# Patient Record
Sex: Female | Born: 1935 | Race: White | Hispanic: No | Smoking: Former smoker
Health system: Southern US, Community
[De-identification: ages and names within clinical notes are randomized; demographics above are authoritative.]

## PROBLEM LIST (undated history)

## (undated) DIAGNOSIS — I5032 Chronic diastolic (congestive) heart failure: Secondary | ICD-10-CM

## (undated) DIAGNOSIS — H3320 Serous retinal detachment, unspecified eye: Secondary | ICD-10-CM

## (undated) DIAGNOSIS — E039 Hypothyroidism, unspecified: Secondary | ICD-10-CM

## (undated) DIAGNOSIS — I10 Essential (primary) hypertension: Secondary | ICD-10-CM

## (undated) DIAGNOSIS — H3530 Unspecified macular degeneration: Secondary | ICD-10-CM

## (undated) DIAGNOSIS — E871 Hypo-osmolality and hyponatremia: Secondary | ICD-10-CM

## (undated) HISTORY — PX: RETINAL DETACHMENT SURGERY: SHX105

## (undated) HISTORY — DX: Hypothyroidism, unspecified: E03.9

## (undated) HISTORY — DX: Chronic diastolic (congestive) heart failure: I50.32

## (undated) HISTORY — PX: CHOLECYSTECTOMY: SHX55

## (undated) HISTORY — DX: Serous retinal detachment, unspecified eye: H33.20

## (undated) HISTORY — PX: CATARACT EXTRACTION: SUR2

## (undated) HISTORY — DX: Unspecified macular degeneration: H35.30

---

## 2008-11-07 ENCOUNTER — Ambulatory Visit: Payer: Self-pay | Admitting: Internal Medicine

## 2011-04-22 DIAGNOSIS — M5137 Other intervertebral disc degeneration, lumbosacral region: Secondary | ICD-10-CM | POA: Diagnosis not present

## 2011-04-22 DIAGNOSIS — M9981 Other biomechanical lesions of cervical region: Secondary | ICD-10-CM | POA: Diagnosis not present

## 2011-04-26 DIAGNOSIS — M5137 Other intervertebral disc degeneration, lumbosacral region: Secondary | ICD-10-CM | POA: Diagnosis not present

## 2011-04-26 DIAGNOSIS — M9981 Other biomechanical lesions of cervical region: Secondary | ICD-10-CM | POA: Diagnosis not present

## 2011-04-28 DIAGNOSIS — M9981 Other biomechanical lesions of cervical region: Secondary | ICD-10-CM | POA: Diagnosis not present

## 2011-04-28 DIAGNOSIS — M5137 Other intervertebral disc degeneration, lumbosacral region: Secondary | ICD-10-CM | POA: Diagnosis not present

## 2011-04-30 DIAGNOSIS — Z23 Encounter for immunization: Secondary | ICD-10-CM | POA: Diagnosis not present

## 2011-05-19 DIAGNOSIS — Z0000 Encounter for general adult medical examination without abnormal findings: Secondary | ICD-10-CM | POA: Diagnosis not present

## 2011-05-19 DIAGNOSIS — E039 Hypothyroidism, unspecified: Secondary | ICD-10-CM | POA: Diagnosis not present

## 2011-05-19 DIAGNOSIS — I10 Essential (primary) hypertension: Secondary | ICD-10-CM | POA: Diagnosis not present

## 2011-05-19 DIAGNOSIS — N951 Menopausal and female climacteric states: Secondary | ICD-10-CM | POA: Diagnosis not present

## 2011-06-21 DIAGNOSIS — Z1231 Encounter for screening mammogram for malignant neoplasm of breast: Secondary | ICD-10-CM | POA: Diagnosis not present

## 2011-06-21 DIAGNOSIS — Z13820 Encounter for screening for osteoporosis: Secondary | ICD-10-CM | POA: Diagnosis not present

## 2011-08-14 DIAGNOSIS — M109 Gout, unspecified: Secondary | ICD-10-CM | POA: Diagnosis not present

## 2011-08-19 ENCOUNTER — Emergency Department (HOSPITAL_COMMUNITY): Payer: Medicare Other

## 2011-08-19 ENCOUNTER — Observation Stay (HOSPITAL_COMMUNITY)
Admission: EM | Admit: 2011-08-19 | Discharge: 2011-08-20 | Disposition: A | Discharge status: Home / Self Care | Payer: Medicare Other | Attending: Emergency Medicine | Admitting: Emergency Medicine

## 2011-08-19 ENCOUNTER — Encounter (HOSPITAL_COMMUNITY): Payer: Self-pay | Admitting: *Deleted

## 2011-08-19 DIAGNOSIS — G9389 Other specified disorders of brain: Secondary | ICD-10-CM | POA: Insufficient documentation

## 2011-08-19 DIAGNOSIS — I10 Essential (primary) hypertension: Secondary | ICD-10-CM | POA: Diagnosis not present

## 2011-08-19 DIAGNOSIS — R262 Difficulty in walking, not elsewhere classified: Secondary | ICD-10-CM | POA: Diagnosis not present

## 2011-08-19 DIAGNOSIS — R42 Dizziness and giddiness: Secondary | ICD-10-CM | POA: Diagnosis not present

## 2011-08-19 DIAGNOSIS — R5383 Other fatigue: Secondary | ICD-10-CM | POA: Insufficient documentation

## 2011-08-19 DIAGNOSIS — R51 Headache: Secondary | ICD-10-CM | POA: Diagnosis not present

## 2011-08-19 DIAGNOSIS — R110 Nausea: Secondary | ICD-10-CM | POA: Diagnosis not present

## 2011-08-19 DIAGNOSIS — F29 Unspecified psychosis not due to a substance or known physiological condition: Secondary | ICD-10-CM | POA: Insufficient documentation

## 2011-08-19 DIAGNOSIS — R5381 Other malaise: Secondary | ICD-10-CM | POA: Insufficient documentation

## 2011-08-19 DIAGNOSIS — R4182 Altered mental status, unspecified: Secondary | ICD-10-CM | POA: Insufficient documentation

## 2011-08-19 HISTORY — DX: Essential (primary) hypertension: I10

## 2011-08-19 LAB — CBC
HCT: 42.4 % (ref 36.0–46.0)
Hemoglobin: 15.4 g/dL — ABNORMAL HIGH (ref 12.0–15.0)
Hemoglobin: 14.9 g/dL (ref 12.0–15.0)
MCH: 30.6 pg (ref 26.0–34.0)
MCH: 30.2 pg (ref 26.0–34.0)
MCHC: 35.2 g/dL (ref 30.0–36.0)
MCV: 86 fL (ref 78.0–100.0)
RBC: 4.93 MIL/uL (ref 3.87–5.11)
RDW: 13.6 % (ref 11.5–15.5)
WBC: 11.9 10*3/uL — ABNORMAL HIGH (ref 4.0–10.5)

## 2011-08-19 LAB — URINALYSIS, ROUTINE W REFLEX MICROSCOPIC
Glucose, UA: NEGATIVE mg/dL
Hgb urine dipstick: NEGATIVE
Ketones, ur: NEGATIVE mg/dL
Protein, ur: NEGATIVE mg/dL
Urobilinogen, UA: 1 mg/dL (ref 0.0–1.0)

## 2011-08-19 LAB — COMPREHENSIVE METABOLIC PANEL
CO2: 27 mEq/L (ref 19–32)
Calcium: 9.7 mg/dL (ref 8.4–10.5)
Chloride: 96 mEq/L (ref 96–112)
Creatinine, Ser: 0.94 mg/dL (ref 0.50–1.10)
GFR calc Af Amer: 65 mL/min — ABNORMAL LOW (ref >90)
GFR calc non Af Amer: 56 mL/min — ABNORMAL LOW (ref >90)
Glucose, Bld: 115 mg/dL — ABNORMAL HIGH (ref 70–99)
Total Bilirubin: 0.5 mg/dL (ref 0.3–1.2)

## 2011-08-19 LAB — POCT I-STAT, CHEM 8
BUN: 17 mg/dL (ref 6–23)
Calcium, Ion: 1.17 mmol/L (ref 1.12–1.32)
Chloride: 99 mEq/L (ref 96–112)
Creatinine, Ser: 0.9 mg/dL (ref 0.50–1.10)
Glucose, Bld: 114 mg/dL — ABNORMAL HIGH (ref 70–99)
HCT: 48 % — ABNORMAL HIGH (ref 36.0–46.0)
Hemoglobin: 16.3 g/dL — ABNORMAL HIGH (ref 12.0–15.0)
Potassium: 3.4 mEq/L — ABNORMAL LOW (ref 3.5–5.1)
Sodium: 135 mEq/L (ref 135–145)
TCO2: 28 mmol/L (ref 0–100)

## 2011-08-19 LAB — LIPID PANEL
HDL: 86 mg/dL (ref >39)
LDL Cholesterol: 84 mg/dL (ref 0–99)
Triglycerides: 81 mg/dL (ref <150)
VLDL: 16 mg/dL (ref 0–40)

## 2011-08-19 LAB — PROTIME-INR: INR: 1.01 (ref 0.00–1.49)

## 2011-08-19 LAB — APTT: aPTT: 33 seconds (ref 24–37)

## 2011-08-19 MED ORDER — TRIAMTERENE-HCTZ 37.5-25 MG PO TABS
1.0000 | ORAL_TABLET | Freq: Once | ORAL | Status: DC
  Filled 2011-08-19: qty 1

## 2011-08-19 MED ORDER — LEVOTHYROXINE SODIUM 75 MCG PO TABS
75.0000 ug | ORAL_TABLET | Freq: Once | ORAL | Status: DC
  Filled 2011-08-19: qty 1

## 2011-08-19 MED ORDER — LISINOPRIL 5 MG PO TABS
5.0000 mg | ORAL_TABLET | Freq: Once | ORAL | Status: DC
  Filled 2011-08-19: qty 1

## 2011-08-19 MED ORDER — ACETAMINOPHEN 325 MG PO TABS: 650.0000 mg | ORAL_TABLET | ORAL | Status: DC | PRN

## 2011-08-19 MED ORDER — METOPROLOL SUCCINATE ER 100 MG PO TB24
100.0000 mg | ORAL_TABLET | Freq: Once | ORAL | Status: DC
  Filled 2011-08-19: qty 1

## 2011-08-19 NOTE — ED Provider Notes (Signed)
History     CSN: [id number]  Arrival date & time 08/19/11  1653   None     Chief Complaint  Patient presents with  . Headache     Patient is a 78 y.o. female presenting with headaches. The history is provided by the patient.  Headache  This is a new problem. The current episode started more than 2 days ago. The problem occurs constantly. Pertinent negatives include no fever.  Pt reports left sided headache on and off for last 3 days. States yesterday was severe. Associated with weakness and nausea. Her daughter who is a NP, states pt was confused on and off yesterday, stumbling at times,  "not being herself"  Pt states she took aspirin for her pain with no improvement. States onset was gradual. Denies visual changes. Denies vomiting. States currently headache is gone. Pt denies fever, chills, neck pain or stiffness.    Past Medical History  Diagnosis Date  . Gout   . Hypertension     Past Surgical History  Procedure Date  . Cholecystectomy   . Retinal detachment surgery     No family history on file.  History  Substance Use Topics  . Smoking status: Not on file  . Smokeless tobacco: Not on file  . Alcohol Use: Yes    OB History    Grav Para Term Preterm Abortions TAB SAB Ect Mult Living                  Review of Systems  Constitutional: Negative for fever, chills, appetite change and fatigue.  HENT: Negative for ear pain, neck pain and neck stiffness.   Eyes: Negative for visual disturbance.  Respiratory: Negative.   Cardiovascular: Negative.   Gastrointestinal: Negative.   Genitourinary: Negative.   Musculoskeletal: Negative.   Skin: Negative.   Neurological: Positive for dizziness, weakness and headaches. Negative for speech difficulty, light-headedness and numbness.  Psychiatric/Behavioral: Negative.     Allergies  Review of patient's allergies indicates no known allergies.  Home Medications   Current Outpatient Rx  Name Route Sig Dispense  Refill  . INDOMETHACIN 50 MG PO CAPS Oral Take 50 mg by mouth every 8 (eight) hours as needed. For gout pain.    Marland Kitchen LEVOTHYROXINE SODIUM 75 MCG PO TABS Oral Take 75 mcg by mouth daily.    Marland Kitchen METOPROLOL SUCCINATE ER 100 MG PO TB24 Oral Take 100 mg by mouth daily. Take with or immediately following a meal.    . QUINAPRIL HCL 10 MG PO TABS Oral Take 10 mg by mouth daily.    . TRIAMTERENE-HCTZ 37.5-25 MG PO TABS Oral Take 1 tablet by mouth daily.      BP 185/110  Pulse 86  Temp(Src) 98.3 F (36.8 C) (Oral)  Resp 13  SpO2 98%  Physical Exam  Nursing note and vitals reviewed. Constitutional: She is oriented to person, place, and time. She appears well-developed and well-nourished. No distress.  HENT:  Head: Normocephalic and atraumatic.       No tenderness over bilateral temporal areas  Eyes: Conjunctivae and EOM are normal. Pupils are equal, round, and reactive to light.  Neck: Normal range of motion. Neck supple.  Cardiovascular: Normal rate, regular rhythm and normal heart sounds.   Pulmonary/Chest: Effort normal and breath sounds normal. No respiratory distress. She has no wheezes.  Abdominal: Soft. Bowel sounds are normal. She exhibits no distension. There is no tenderness.  Musculoskeletal: Normal range of motion. She exhibits no edema.  Lymphadenopathy:  She has no cervical adenopathy.  Neurological: She is alert and oriented to person, place, and time. She has normal reflexes. No cranial nerve deficit. Coordination normal.       5/5 and equal grip strength bilaterally, no pronator drift, normal finger to nose  Skin: Skin is warm.  Psychiatric: She has a normal mood and affect.    ED Course  Procedures    Pt with intermittent headache over last several days. Onset gradual, doubt bleed. CT head negative. Per daughter, concern for TIAs  Since confused yesterday, unbalanced.   Results for orders placed during the hospital encounter of 08/19/11  CBC      Component Value Range    WBC 12.0 (*) 4.0 - 10.5 (K/uL)   RBC 5.04  3.87 - 5.11 (MIL/uL)   Hemoglobin 15.4 (*) 12.0 - 15.0 (g/dL)   HCT 16.1  09.6 - 04.5 (%)   MCV 86.7  78.0 - 100.0 (fL)   MCH 30.6  26.0 - 34.0 (pg)   MCHC 35.2  30.0 - 36.0 (g/dL)   RDW 40.9  81.1 - 91.4 (%)   Platelets 274  150 - 400 (K/uL)  URINALYSIS, ROUTINE W REFLEX MICROSCOPIC      Component Value Range   Color, Urine YELLOW  YELLOW    APPearance CLEAR  CLEAR    Specific Gravity, Urine 1.008  1.005 - 1.030    pH 7.5  5.0 - 8.0    Glucose, UA NEGATIVE  NEGATIVE (mg/dL)   Hgb urine dipstick NEGATIVE  NEGATIVE    Bilirubin Urine NEGATIVE  NEGATIVE    Ketones, ur NEGATIVE  NEGATIVE (mg/dL)   Protein, ur NEGATIVE  NEGATIVE (mg/dL)   Urobilinogen, UA 1.0  0.0 - 1.0 (mg/dL)   Nitrite NEGATIVE  NEGATIVE    Leukocytes, UA MODERATE (*) NEGATIVE   URINE MICROSCOPIC-ADD ON      Component Value Range   Squamous Epithelial / LPF RARE  RARE    WBC, UA 3-6  <3 (WBC/hpf)   RBC / HPF 0-2  <3 (RBC/hpf)  POCT I-STAT, CHEM 8      Component Value Range   Sodium 135  135 - 145 (mEq/L)   Potassium 3.4 (*) 3.5 - 5.1 (mEq/L)   Chloride 99  96 - 112 (mEq/L)   BUN 17  6 - 23 (mg/dL)   Creatinine, Ser 7.82  0.50 - 1.10 (mg/dL)   Glucose, Bld 956 (*) 70 - 99 (mg/dL)   Calcium, Ion 2.13  0.86 - 1.32 (mmol/L)   TCO2 28  0 - 100 (mmol/L)   Hemoglobin 16.3 (*) 12.0 - 15.0 (g/dL)   HCT 57.8 (*) 46.9 - 46.0 (%)  CBC      Component Value Range   WBC 11.9 (*) 4.0 - 10.5 (K/uL)   RBC 4.93  3.87 - 5.11 (MIL/uL)   Hemoglobin 14.9  12.0 - 15.0 (g/dL)   HCT 62.9  52.8 - 41.3 (%)   MCV 86.0  78.0 - 100.0 (fL)   MCH 30.2  26.0 - 34.0 (pg)   MCHC 35.1  30.0 - 36.0 (g/dL)   RDW 24.4  01.0 - 27.2 (%)   Platelets 273  150 - 400 (K/uL)  COMPREHENSIVE METABOLIC PANEL      Component Value Range   Sodium 134 (*) 135 - 145 (mEq/L)   Potassium 3.4 (*) 3.5 - 5.1 (mEq/L)   Chloride 96  96 - 112 (mEq/L)   CO2 27  19 - 32 (mEq/L)  Glucose, Bld 115 (*) 70 - 99  (mg/dL)   BUN 17  6 - 23 (mg/dL)   Creatinine, Ser 3.66  0.50 - 1.10 (mg/dL)   Calcium 9.7  8.4 - 44.0 (mg/dL)   Total Protein 7.1  6.0 - 8.3 (g/dL)   Albumin 3.8  3.5 - 5.2 (g/dL)   AST 19  0 - 37 (U/L)   ALT 15  0 - 35 (U/L)   Alkaline Phosphatase 90  39 - 117 (U/L)   Total Bilirubin 0.5  0.3 - 1.2 (mg/dL)   GFR calc non Af Amer 56 (*) >90 (mL/min)   GFR calc Af Amer 65 (*) >90 (mL/min)  PROTIME-INR      Component Value Range   Prothrombin Time 13.5  11.6 - 15.2 (seconds)   INR 1.01  0.00 - 1.49   APTT      Component Value Range   aPTT 33  24 - 37 (seconds)  URINALYSIS, ROUTINE W REFLEX MICROSCOPIC      Component Value Range   Color, Urine YELLOW  YELLOW    APPearance CLEAR  CLEAR    Specific Gravity, Urine 1.007  1.005 - 1.030    pH 7.5  5.0 - 8.0    Glucose, UA NEGATIVE  NEGATIVE (mg/dL)   Hgb urine dipstick NEGATIVE  NEGATIVE    Bilirubin Urine NEGATIVE  NEGATIVE    Ketones, ur NEGATIVE  NEGATIVE (mg/dL)   Protein, ur NEGATIVE  NEGATIVE (mg/dL)   Urobilinogen, UA 0.2  0.0 - 1.0 (mg/dL)   Nitrite NEGATIVE  NEGATIVE    Leukocytes, UA MODERATE (*) NEGATIVE   LIPID PANEL      Component Value Range   Cholesterol 186  0 - 200 (mg/dL)   Triglycerides 81  <347 (mg/dL)   HDL 86  >42 (mg/dL)   Total CHOL/HDL Ratio 2.2     VLDL 16  0 - 40 (mg/dL)   LDL Cholesterol 84  0 - 99 (mg/dL)  POCT I-STAT TROPONIN I      Component Value Range   Troponin i, poc 0.01  0.00 - 0.08 (ng/mL)   Comment 3           URINE MICROSCOPIC-ADD ON      Component Value Range   Squamous Epithelial / LPF RARE  RARE    WBC, UA 3-6  <3 (WBC/hpf)   Bacteria, UA RARE  RARE    Ct Head Wo Contrast  08/19/2011  *RADIOLOGY REPORT*  Clinical Data: Left sided headache, dizziness and nausea  CT HEAD WITHOUT CONTRAST  Technique:  Contiguous axial images were obtained from the base of the skull through the vertex without contrast.  Comparison: None.  Findings: There is mild diffuse atrophy with corresponding mild  ex vacuo dilatation of the ventricular system.  The scattered periventricular hypodensities compatible with microvascular ischemic disease.  Gray white differentiation is otherwise well maintained without definite CT evidence of acute large territory infarct.  No intraparenchymal or extra-axial mass or hemorrhage. No midline shift.  The paranasal sinuses and mastoid air cells are normal.  Regional soft tissues are normal.  Bilateral cataract surgery.  IMPRESSION: Age appropriate atrophy and microvascular ischemic disease without acute intracranial process.  Original Report Authenticated By: Waynard Reeds, M.D.    Plan discussed with Dr. Manus Gunning. Pt is ABCD score of 2, will place on TIA protocol. She is asymptomatic at present. Non toxic. No prior stroke hx. Headache free. Placed in CDU, will be followed by PA  Geiple.    Date: 08/20/2011  Rate: 66  Rhythm: normal sinus rhythm  QRS Axis: normal  Intervals: normal  ST/T Wave abnormalities: nonspecific T wave changes  Conduction Disutrbances:none  Narrative Interpretation:   Old EKG Reviewed: none available    No diagnosis found. The following section was written by PA Geiple:  Patient to CDU from stretcher triage on TIA protocol. Patient with 2 days of intermittent headaches with difficulty walking with associated L sided weakness.   Vital signs reviewed and are as follows: Filed Vitals:   08/19/11 1937  BP: 183/94  Pulse: 75  Temp: 98 F (36.7 C)  Resp: 16   11:37 PM Exam:  Gen NAD; Heart RRR, nml S1,S2, no m/r/g; Lungs CTAB; Abd soft, NT, no rebound or guarding; Ext 2+ pedal pulses bilaterally, no edema; Neuro III-XII intact, normal sensation, 5/5 strength in upper and lower extremities, normal sensation to light touch on extremities, neg Rom.   Plan: Patient to complete TIA protocol work-up in morning. Disposition based on these results.   Dr. Richrd Prime PA-C aware of patient and will monitor overnight.   MDM         Lottie Mussel, PA 08/20/11 [phone]

## 2011-08-19 NOTE — ED Notes (Signed)
Pt is here for headache since Tuesday.  Pt has had nausea with this

## 2011-08-19 NOTE — ED Notes (Addendum)
NIH score zero and pt pass swallow screen eval. Snack and drink given to pt, tolerated diet well.

## 2011-08-20 ENCOUNTER — Observation Stay (HOSPITAL_COMMUNITY): Payer: Medicare Other

## 2011-08-20 DIAGNOSIS — I369 Nonrheumatic tricuspid valve disorder, unspecified: Secondary | ICD-10-CM

## 2011-08-20 DIAGNOSIS — G459 Transient cerebral ischemic attack, unspecified: Secondary | ICD-10-CM | POA: Diagnosis not present

## 2011-08-20 DIAGNOSIS — R51 Headache: Secondary | ICD-10-CM | POA: Diagnosis not present

## 2011-08-20 DIAGNOSIS — G319 Degenerative disease of nervous system, unspecified: Secondary | ICD-10-CM | POA: Diagnosis not present

## 2011-08-20 LAB — URINE MICROSCOPIC-ADD ON

## 2011-08-20 LAB — URINALYSIS, ROUTINE W REFLEX MICROSCOPIC
Bilirubin Urine: NEGATIVE
Hgb urine dipstick: NEGATIVE
Ketones, ur: NEGATIVE mg/dL
Nitrite: NEGATIVE
Specific Gravity, Urine: 1.007 (ref 1.005–1.030)
Urobilinogen, UA: 0.2 mg/dL (ref 0.0–1.0)
pH: 7.5 (ref 5.0–8.0)

## 2011-08-20 LAB — HEMOGLOBIN A1C: Hgb A1c MFr Bld: 5.8 % — ABNORMAL HIGH (ref <5.7)

## 2011-08-20 LAB — POCT I-STAT TROPONIN I: Troponin i, poc: 0.01 ng/mL (ref 0.00–0.08)

## 2011-08-20 MED ORDER — ASPIRIN 81 MG PO CHEW: 81.0000 mg | CHEWABLE_TABLET | Freq: Every day | ORAL | Status: AC

## 2011-08-20 MED ORDER — ONDANSETRON HCL 4 MG/2ML IJ SOLN
4.0000 mg | Freq: Four times a day (QID) | INTRAMUSCULAR | Status: DC | PRN
  Administered 2011-08-20: 4 mg via INTRAVENOUS
  Filled 2011-08-20: qty 2

## 2011-08-20 MED ORDER — ASPIRIN 81 MG PO CHEW
81.0000 mg | CHEWABLE_TABLET | Freq: Once | ORAL | Status: AC
  Administered 2011-08-20: 81 mg via ORAL
  Filled 2011-08-20: qty 1

## 2011-08-20 NOTE — ED Notes (Signed)
Patient transported to MRI 

## 2011-08-20 NOTE — ED Notes (Signed)
Ordered breakfast tray for patient

## 2011-08-20 NOTE — ED Provider Notes (Signed)
Medical screening examination/treatment/procedure(s) were conducted as a shared visit with non-physician practitioner(s) and myself.  I personally evaluated the patient during the encounter  Gradual onset headache with hypertension x 3days.  "different, sharper" pain today but still gradual onset.  NO headache now.  Nonfocal neuro exam.  Family concerned for TIA as was intermittently "confused and stumbling around" yesterday.  Patient denies this.  Glynn Octave, MD 08/20/11 [phone]

## 2011-08-20 NOTE — Discharge Instructions (Signed)
Please call your primary care provider today to schedule a close follow up appointment.  Please make sure to let them know that you were in the ER so they can review your test results.  As we discussed, your urine has been sent for culture, and you will be notified and an antibiotic called in if you have an infection.  If you develop any returned symptoms or new symptoms including weakness or numbness of the extremities, difficulty walking, speaking, or thinking of words, or severe headache, please return immediately to the ER.  Please have your blood pressure rechecked next week. You may return to the ER at any time for worsening condition or any new symptoms that concern you.   Headache, General, Unknown Cause The specific cause of your headache may not have been found today. There are many causes and types of headache. A few common ones are:  Tension headache.   Migraine.   Infections (examples: dental and sinus infections).   Bone and/or joint problems in the neck or jaw.   Depression.   Eye problems.  These headaches are not life threatening.  Headaches can sometimes be diagnosed by a patient history and a physical exam. Sometimes, lab and imaging studies (such as x-ray and/or CT scan) are used to rule out more serious problems. In some cases, a spinal tap (lumbar puncture) may be requested. There are many times when your exam and tests may be normal on the first visit even when there is a serious problem causing your headaches. Because of that, it is very important to follow up with your doctor or local clinic for further evaluation. FINDING OUT THE RESULTS OF TESTS  If a radiology test was performed, a radiologist will review your results.   You will be contacted by the emergency department or your physician if any test results require a change in your treatment plan.   Not all test results may be available during your visit. If your test results are not back during the visit, make an  appointment with your caregiver to find out the results. Do not assume everything is normal if you have not heard from your caregiver or the medical facility. It is important for you to follow up on all of your test results.  HOME CARE INSTRUCTIONS   Keep follow-up appointments with your caregiver, or any specialist referral.   Only take over-the-counter or prescription medicines for pain, discomfort, or fever as directed by your caregiver.   Biofeedback, massage, or other relaxation techniques may be helpful.   Ice packs or heat applied to the head and neck can be used. Do this three to four times per day, or as needed.   Call your doctor if you have any questions or concerns.   If you smoke, you should quit.  SEEK MEDICAL CARE IF:   You develop problems with medications prescribed.   You do not respond to or obtain relief from medications.   You have a change from the usual headache.   You develop nausea or vomiting.  SEEK IMMEDIATE MEDICAL CARE IF:   If your headache becomes severe.   You have an unexplained oral temperature above 102 F (38.9 C), or as your caregiver suggests.   You have a stiff neck.   You have loss of vision.   You have muscular weakness.   You have loss of muscular control.   You develop severe symptoms different from your first symptoms.   You start losing your balance  or have trouble walking.   You feel faint or pass out.  MAKE SURE YOU:   Understand these instructions.   Will watch your condition.   Will get help right away if you are not doing well or get worse.  Document Released: 04/05/2005 Document Revised: 03/25/2011 Document Reviewed: 11/23/2007 [facility] Patient Information 2012 [place], [place].   Hypertension Information As your heart beats, it forces blood through your arteries. This force is your blood pressure. If the pressure is too high, it is called hypertension (HTN) or high blood pressure. HTN is dangerous because you  may have it and not know it. High blood pressure may mean that your heart has to work harder to pump blood. Your arteries may be narrow or stiff. The extra work puts you at risk for heart disease, stroke, and other problems.  Blood pressure consists of two numbers, a higher number over a lower, 110/72, for example. It is stated as "110 over 72." The ideal is below 120 for the top number (systolic) and under 80 for the bottom (diastolic).  You should pay close attention to your blood pressure if you have certain conditions such as:  Heart failure.   Prior heart attack.   Diabetes   Chronic kidney disease.   Prior stroke.   Multiple risk factors for heart disease.  To see if you have HTN, your blood pressure should be measured while you are seated with your arm held at the level of the heart. It should be measured at least twice. A one-time elevated blood pressure reading (especially in the Emergency Department) does not mean that you need treatment. There may be conditions in which the blood pressure is different between your right and left arms. It is important to see your caregiver soon for a recheck. Most people have essential hypertension which means that there is not a specific cause. This type of high blood pressure may be lowered by changing lifestyle factors such as:  Stress.   Smoking.   Lack of exercise.   Excessive weight.   Drug/tobacco/alcohol use.   Eating less salt.  Most people do not have symptoms from high blood pressure until it has caused damage to the body. Effective treatment can often prevent, delay or reduce that damage. TREATMENT  Treatment for high blood pressure, when a cause has been identified, is directed at the cause. There are a large number of medications to treat HTN. These fall into several categories, and your caregiver will help you select the medicines that are best for you. Medications may have side effects. You should review side effects with your  caregiver. If your blood pressure stays high after you have made lifestyle changes or started on medicines,   Your medication(s) may need to be changed.   Other problems may need to be addressed.   Be certain you understand your prescriptions, and know how and when to take your medicine.   Be sure to follow up with your caregiver within the time frame advised (usually within two weeks) to have your blood pressure rechecked and to review your medications.   If you are taking more than one medicine to lower your blood pressure, make sure you know how and at what times they should be taken. Taking two medicines at the same time can result in blood pressure that is too low.  Document Released: 06/08/2005 Document Revised: 12/16/2010 Document Reviewed: 06/15/2007 [facility] Patient Information 2012 [place], [place].

## 2011-08-20 NOTE — ED Provider Notes (Signed)
8:49 AM Pt currently at vascular lab getting doppler US of carotids, echo.  10:27 AM Patient has returned from vascular lab, reports she is feeling 100% better.  Denies headache, focal neurological deficits, nausea.  She is currently eating breakfast, having passed the swallow screen.  I have discussed MRI results with her as well as borderline UA.  Pt did have urinary frequency 2 days ago but this has since resolved.  Denies dysuria, frequency, or urgency currently.  Urine has been sent for culture.  On exam, pt is A&Ox4, NAD, RRR, CTAB CN III-XII intact, EOMs intact, no nystagmus, no pronator drift, grip strengths equal bilaterally; finger to nose, heel to shin, rapid alternating movements are normal; strength 5/5 in all extremities, sensation is intact.  Carotid doppler and echo pending.   PCP Dr Sheryle Hail at [facility].   Patient's carotid dopplers negative, echo showing mild regurgitation, otherwise normal.  Echo report called by Dr Tenny Craw.    Per discussion with Jaynie Crumble this morning, given normal MRI, carotid dopplers, and echo, pt to be d/c home with diagnosis of headache only.  Not TIA.  There is a question of UTI.  Pt did have symptoms a few days ago, now resolved, UA borderline, sent for culture.  Pt informed of results.  Pt d/c home with PCP follow up, daily aspirin (as per my discussion with patient, she states she is "already supposed to be on it" and had symptoms that were concerning for possible TIA, and no history of GI bleeding, have advised her to take the daily aspirin and continue the discussion with her doctor), return precautions given.  Patient verbalizes understanding and agrees with plan.     Results for orders placed during the hospital encounter of 08/19/11  CBC      Component Value Range   WBC 12.0 (*) 4.0 - 10.5 (K/uL)   RBC 5.04  3.87 - 5.11 (MIL/uL)   Hemoglobin 15.4 (*) 12.0 - 15.0 (g/dL)   HCT 16.1  09.6 - 04.5 (%)   MCV 86.7  78.0 - 100.0 (fL)   MCH  30.6  26.0 - 34.0 (pg)   MCHC 35.2  30.0 - 36.0 (g/dL)   RDW 40.9  81.1 - 91.4 (%)   Platelets 274  150 - 400 (K/uL)  URINALYSIS, ROUTINE W REFLEX MICROSCOPIC      Component Value Range   Color, Urine YELLOW  YELLOW    APPearance CLEAR  CLEAR    Specific Gravity, Urine 1.008  1.005 - 1.030    pH 7.5  5.0 - 8.0    Glucose, UA NEGATIVE  NEGATIVE (mg/dL)   Hgb urine dipstick NEGATIVE  NEGATIVE    Bilirubin Urine NEGATIVE  NEGATIVE    Ketones, ur NEGATIVE  NEGATIVE (mg/dL)   Protein, ur NEGATIVE  NEGATIVE (mg/dL)   Urobilinogen, UA 1.0  0.0 - 1.0 (mg/dL)   Nitrite NEGATIVE  NEGATIVE    Leukocytes, UA MODERATE (*) NEGATIVE   URINE MICROSCOPIC-ADD ON      Component Value Range   Squamous Epithelial / LPF RARE  RARE    WBC, UA 3-6  <3 (WBC/hpf)   RBC / HPF 0-2  <3 (RBC/hpf)  POCT I-STAT, CHEM 8      Component Value Range   Sodium 135  135 - 145 (mEq/L)   Potassium 3.4 (*) 3.5 - 5.1 (mEq/L)   Chloride 99  96 - 112 (mEq/L)   BUN 17  6 - 23 (mg/dL)   Creatinine,  Ser 0.90  0.50 - 1.10 (mg/dL)   Glucose, Bld 191 (*) 70 - 99 (mg/dL)   Calcium, Ion 4.78  2.95 - 1.32 (mmol/L)   TCO2 28  0 - 100 (mmol/L)   Hemoglobin 16.3 (*) 12.0 - 15.0 (g/dL)   HCT 62.1 (*) 30.8 - 46.0 (%)  CBC      Component Value Range   WBC 11.9 (*) 4.0 - 10.5 (K/uL)   RBC 4.93  3.87 - 5.11 (MIL/uL)   Hemoglobin 14.9  12.0 - 15.0 (g/dL)   HCT 65.7  84.6 - 96.2 (%)   MCV 86.0  78.0 - 100.0 (fL)   MCH 30.2  26.0 - 34.0 (pg)   MCHC 35.1  30.0 - 36.0 (g/dL)   RDW 95.2  84.1 - 32.4 (%)   Platelets 273  150 - 400 (K/uL)  COMPREHENSIVE METABOLIC PANEL      Component Value Range   Sodium 134 (*) 135 - 145 (mEq/L)   Potassium 3.4 (*) 3.5 - 5.1 (mEq/L)   Chloride 96  96 - 112 (mEq/L)   CO2 27  19 - 32 (mEq/L)   Glucose, Bld 115 (*) 70 - 99 (mg/dL)   BUN 17  6 - 23 (mg/dL)   Creatinine, Ser 4.01  0.50 - 1.10 (mg/dL)   Calcium 9.7  8.4 - 02.7 (mg/dL)   Total Protein 7.1  6.0 - 8.3 (g/dL)   Albumin 3.8  3.5 - 5.2  (g/dL)   AST 19  0 - 37 (U/L)   ALT 15  0 - 35 (U/L)   Alkaline Phosphatase 90  39 - 117 (U/L)   Total Bilirubin 0.5  0.3 - 1.2 (mg/dL)   GFR calc non Af Amer 56 (*) >90 (mL/min)   GFR calc Af Amer 65 (*) >90 (mL/min)  PROTIME-INR      Component Value Range   Prothrombin Time 13.5  11.6 - 15.2 (seconds)   INR 1.01  0.00 - 1.49   APTT      Component Value Range   aPTT 33  24 - 37 (seconds)  URINALYSIS, ROUTINE W REFLEX MICROSCOPIC      Component Value Range   Color, Urine YELLOW  YELLOW    APPearance CLEAR  CLEAR    Specific Gravity, Urine 1.007  1.005 - 1.030    pH 7.5  5.0 - 8.0    Glucose, UA NEGATIVE  NEGATIVE (mg/dL)   Hgb urine dipstick NEGATIVE  NEGATIVE    Bilirubin Urine NEGATIVE  NEGATIVE    Ketones, ur NEGATIVE  NEGATIVE (mg/dL)   Protein, ur NEGATIVE  NEGATIVE (mg/dL)   Urobilinogen, UA 0.2  0.0 - 1.0 (mg/dL)   Nitrite NEGATIVE  NEGATIVE    Leukocytes, UA MODERATE (*) NEGATIVE   LIPID PANEL      Component Value Range   Cholesterol 186  0 - 200 (mg/dL)   Triglycerides 81  <253 (mg/dL)   HDL 86  >66 (mg/dL)   Total CHOL/HDL Ratio 2.2     VLDL 16  0 - 40 (mg/dL)   LDL Cholesterol 84  0 - 99 (mg/dL)  POCT I-STAT TROPONIN I      Component Value Range   Troponin i, poc 0.01  0.00 - 0.08 (ng/mL)   Comment 3           URINE MICROSCOPIC-ADD ON      Component Value Range   Squamous Epithelial / LPF RARE  RARE    WBC, UA 3-6  <3 (  WBC/hpf)   Bacteria, UA RARE  RARE    Ct Head Wo Contrast  08/19/2011  *RADIOLOGY REPORT*  Clinical Data: Left sided headache, dizziness and nausea  CT HEAD WITHOUT CONTRAST  Technique:  Contiguous axial images were obtained from the base of the skull through the vertex without contrast.  Comparison: None.  Findings: There is mild diffuse atrophy with corresponding mild ex vacuo dilatation of the ventricular system.  The scattered periventricular hypodensities compatible with microvascular ischemic disease.  Gray white differentiation is  otherwise well maintained without definite CT evidence of acute large territory infarct.  No intraparenchymal or extra-axial mass or hemorrhage. No midline shift.  The paranasal sinuses and mastoid air cells are normal.  Regional soft tissues are normal.  Bilateral cataract surgery.  IMPRESSION: Age appropriate atrophy and microvascular ischemic disease without acute intracranial process.  Original Report Authenticated By: Waynard Reeds, M.D.   Mr Brain Wo Contrast  08/20/2011  *RADIOLOGY REPORT*  Clinical Data:  Severe left sided headache.  MRI HEAD WITHOUT CONTRAST MRA HEAD WITHOUT CONTRAST  Technique:  Multiplanar, multiecho pulse sequences of the brain and surrounding structures were obtained without intravenous contrast. Angiographic images of the head were obtained using MRA technique without contrast.  Comparison:  CT head 08/19/2011.  MRI HEAD  Findings:  No acute infarct, hemorrhage, or mass lesion is present. The  Scattered periventricular and subcortical white matter changes are greater than expected for age.  The ventricles are proportionate to mild atrophy.  No significant extra-axial fluid collections are present.  Flow is present in the major intracranial arteries.  The patient is status post bilateral lens extractions.  Mild mucosal thickening is noted in the maxillary sinuses bilaterally.  The paranasal sinuses and mastoid air cells are otherwise clear.  IMPRESSION: 1.  No acute intracranial abnormality. 2.  Atrophy and mild white matter disease.  This likely reflects the sequelae of chronic microvascular ischemia. 3.  Minimal mucosal disease in the maxillary sinuses bilaterally.  MRA HEAD  Findings: The internal carotid arteries are within normal limits from high cervical segments through the ICA termini.  The A1 and M1 segments are normal.  The anterior communicating artery is patent.  The left vertebral artery is slightly dominant to the right.  The basilar artery is small.  The left  posterior cerebral artery is of fetal type.  The right posterior cerebral artery is predominately fed by the posterior communicating artery with a small contribution from the right P1 segment.  The PCA branch vessels are within normal limits.  IMPRESSION: Normal variant MRA circle of Willis without evidence for significant proximal stenosis, aneurysm, or branch vessel occlusion.  Original Report Authenticated By: Jamesetta Orleans. MATTERN, M.D.   Mr Maxine Glenn Head/brain Wo Cm  08/20/2011  *RADIOLOGY REPORT*  Clinical Data:  Severe left sided headache.  MRI HEAD WITHOUT CONTRAST MRA HEAD WITHOUT CONTRAST  Technique:  Multiplanar, multiecho pulse sequences of the brain and surrounding structures were obtained without intravenous contrast. Angiographic images of the head were obtained using MRA technique without contrast.  Comparison:  CT head 08/19/2011.  MRI HEAD  Findings:  No acute infarct, hemorrhage, or mass lesion is present. The  Scattered periventricular and subcortical white matter changes are greater than expected for age.  The ventricles are proportionate to mild atrophy.  No significant extra-axial fluid collections are present.  Flow is present in the major intracranial arteries.  The patient is status post bilateral lens extractions.  Mild mucosal thickening is noted  in the maxillary sinuses bilaterally.  The paranasal sinuses and mastoid air cells are otherwise clear.  IMPRESSION: 1.  No acute intracranial abnormality. 2.  Atrophy and mild white matter disease.  This likely reflects the sequelae of chronic microvascular ischemia. 3.  Minimal mucosal disease in the maxillary sinuses bilaterally.  MRA HEAD  Findings: The internal carotid arteries are within normal limits from high cervical segments through the ICA termini.  The A1 and M1 segments are normal.  The anterior communicating artery is patent.  The left vertebral artery is slightly dominant to the right.  The basilar artery is small.  The left  posterior cerebral artery is of fetal type.  The right posterior cerebral artery is predominately fed by the posterior communicating artery with a small contribution from the right P1 segment.  The PCA branch vessels are within normal limits.  IMPRESSION: Normal variant MRA circle of Willis without evidence for significant proximal stenosis, aneurysm, or branch vessel occlusion.  Original Report Authenticated By: Jamesetta Orleans. MATTERN, M.D.       Rise Patience, [place] 08/20/11 1228

## 2011-08-20 NOTE — ED Provider Notes (Signed)
Medical screening examination/treatment/procedure(s) were conducted as a shared visit with non-physician practitioner(s) and myself.  I personally evaluated the patient during the encounter   Glynn Octave, MD 08/20/11 1622

## 2011-08-20 NOTE — Progress Notes (Signed)
VASCULAR LAB PRELIMINARY  PRELIMINARY  PRELIMINARY  PRELIMINARY  Carotid duplex completed.    Preliminary report:  Bilateral:  No evidence of hemodynamically significant internal carotid artery stenosis.   Vertebral artery flow is antegrade.    Rebekah Bush D, RVS5/06/2011, 10:23 AM

## 2011-08-20 NOTE — ED Notes (Signed)
Pt alerted me her purse was missing. Checked room with EMT, Onalee Hua. Check trash, dirty linen, unable to find. Pt was transported to carotid doppler at 0820. Pt returned around 1000. Transporters contacted, reporting don't remember pt having purse. When I arrived this am, pt in MRI. Do not remember seeing purse during contact with patient. MRI, echo, carotid doppler areas checked, no purse found. Consulting civil engineer, AD, security notified. Called pt daughter, reporting she does not have purse.

## 2011-08-20 NOTE — Progress Notes (Signed)
Observation review is complete. 

## 2011-08-20 NOTE — ED Notes (Signed)
Transported for carotid doppler.

## 2011-08-20 NOTE — ED Notes (Signed)
Pt provided education and stroke book.

## 2011-08-21 LAB — URINE CULTURE
Colony Count: 30000
Culture  Setup Time: 201305030828

## 2011-10-11 DIAGNOSIS — M109 Gout, unspecified: Secondary | ICD-10-CM | POA: Diagnosis not present

## 2011-10-11 DIAGNOSIS — I10 Essential (primary) hypertension: Secondary | ICD-10-CM | POA: Diagnosis not present

## 2011-11-03 DIAGNOSIS — R5383 Other fatigue: Secondary | ICD-10-CM | POA: Diagnosis not present

## 2011-11-03 DIAGNOSIS — R5381 Other malaise: Secondary | ICD-10-CM | POA: Diagnosis not present

## 2011-11-03 DIAGNOSIS — I10 Essential (primary) hypertension: Secondary | ICD-10-CM | POA: Diagnosis not present

## 2011-11-03 DIAGNOSIS — M109 Gout, unspecified: Secondary | ICD-10-CM | POA: Diagnosis not present

## 2012-04-25 DIAGNOSIS — I10 Essential (primary) hypertension: Secondary | ICD-10-CM | POA: Diagnosis not present

## 2012-04-25 DIAGNOSIS — E039 Hypothyroidism, unspecified: Secondary | ICD-10-CM | POA: Diagnosis not present

## 2012-10-30 DIAGNOSIS — I10 Essential (primary) hypertension: Secondary | ICD-10-CM | POA: Diagnosis not present

## 2012-10-30 DIAGNOSIS — E039 Hypothyroidism, unspecified: Secondary | ICD-10-CM | POA: Diagnosis not present

## 2012-11-09 DIAGNOSIS — I10 Essential (primary) hypertension: Secondary | ICD-10-CM | POA: Diagnosis not present

## 2012-11-09 DIAGNOSIS — E039 Hypothyroidism, unspecified: Secondary | ICD-10-CM | POA: Diagnosis not present

## 2012-11-14 DIAGNOSIS — H35379 Puckering of macula, unspecified eye: Secondary | ICD-10-CM | POA: Diagnosis not present

## 2012-11-14 DIAGNOSIS — H353190 Nonexudative age-related macular degeneration, unspecified eye, stage unspecified: Secondary | ICD-10-CM | POA: Diagnosis not present

## 2012-11-14 DIAGNOSIS — H43819 Vitreous degeneration, unspecified eye: Secondary | ICD-10-CM | POA: Diagnosis not present

## 2012-12-11 DIAGNOSIS — H3530 Unspecified macular degeneration: Secondary | ICD-10-CM | POA: Diagnosis not present

## 2012-12-11 DIAGNOSIS — H35379 Puckering of macula, unspecified eye: Secondary | ICD-10-CM | POA: Diagnosis not present

## 2012-12-11 DIAGNOSIS — H43819 Vitreous degeneration, unspecified eye: Secondary | ICD-10-CM | POA: Diagnosis not present

## 2012-12-14 DIAGNOSIS — C4442 Squamous cell carcinoma of skin of scalp and neck: Secondary | ICD-10-CM | POA: Diagnosis not present

## 2012-12-14 DIAGNOSIS — D044 Carcinoma in situ of skin of scalp and neck: Secondary | ICD-10-CM | POA: Diagnosis not present

## 2012-12-14 DIAGNOSIS — L570 Actinic keratosis: Secondary | ICD-10-CM | POA: Diagnosis not present

## 2013-01-04 DIAGNOSIS — R350 Frequency of micturition: Secondary | ICD-10-CM | POA: Diagnosis not present

## 2013-01-04 DIAGNOSIS — R351 Nocturia: Secondary | ICD-10-CM | POA: Diagnosis not present

## 2013-01-04 DIAGNOSIS — N390 Urinary tract infection, site not specified: Secondary | ICD-10-CM | POA: Diagnosis not present

## 2013-01-11 DIAGNOSIS — N390 Urinary tract infection, site not specified: Secondary | ICD-10-CM | POA: Diagnosis not present

## 2013-01-16 DIAGNOSIS — R404 Transient alteration of awareness: Secondary | ICD-10-CM | POA: Diagnosis not present

## 2013-01-16 DIAGNOSIS — R42 Dizziness and giddiness: Secondary | ICD-10-CM | POA: Diagnosis not present

## 2013-01-23 ENCOUNTER — Emergency Department (HOSPITAL_COMMUNITY): Payer: Medicare Other

## 2013-01-23 ENCOUNTER — Emergency Department (HOSPITAL_COMMUNITY)
Admission: EM | Admit: 2013-01-23 | Discharge: 2013-01-23 | Disposition: A | Discharge status: Home / Self Care | Payer: Medicare Other | Attending: Emergency Medicine | Admitting: Emergency Medicine

## 2013-01-23 ENCOUNTER — Encounter (HOSPITAL_COMMUNITY): Payer: Self-pay | Admitting: *Deleted

## 2013-01-23 DIAGNOSIS — Z87440 Personal history of urinary (tract) infections: Secondary | ICD-10-CM | POA: Insufficient documentation

## 2013-01-23 DIAGNOSIS — Z862 Personal history of diseases of the blood and blood-forming organs and certain disorders involving the immune mechanism: Secondary | ICD-10-CM | POA: Insufficient documentation

## 2013-01-23 DIAGNOSIS — F411 Generalized anxiety disorder: Secondary | ICD-10-CM | POA: Diagnosis not present

## 2013-01-23 DIAGNOSIS — I10 Essential (primary) hypertension: Secondary | ICD-10-CM | POA: Insufficient documentation

## 2013-01-23 DIAGNOSIS — Z79899 Other long term (current) drug therapy: Secondary | ICD-10-CM | POA: Diagnosis not present

## 2013-01-23 DIAGNOSIS — R079 Chest pain, unspecified: Secondary | ICD-10-CM | POA: Diagnosis not present

## 2013-01-23 DIAGNOSIS — R209 Unspecified disturbances of skin sensation: Secondary | ICD-10-CM | POA: Insufficient documentation

## 2013-01-23 DIAGNOSIS — R0602 Shortness of breath: Secondary | ICD-10-CM | POA: Insufficient documentation

## 2013-01-23 DIAGNOSIS — R51 Headache: Secondary | ICD-10-CM | POA: Diagnosis not present

## 2013-01-23 DIAGNOSIS — R110 Nausea: Secondary | ICD-10-CM | POA: Diagnosis not present

## 2013-01-23 DIAGNOSIS — R3989 Other symptoms and signs involving the genitourinary system: Secondary | ICD-10-CM | POA: Diagnosis not present

## 2013-01-23 DIAGNOSIS — Z8639 Personal history of other endocrine, nutritional and metabolic disease: Secondary | ICD-10-CM | POA: Insufficient documentation

## 2013-01-23 DIAGNOSIS — J40 Bronchitis, not specified as acute or chronic: Secondary | ICD-10-CM | POA: Diagnosis not present

## 2013-01-23 DIAGNOSIS — R0789 Other chest pain: Secondary | ICD-10-CM | POA: Diagnosis not present

## 2013-01-23 LAB — CBC WITH DIFFERENTIAL/PLATELET
Basophils Absolute: 0 10*3/uL (ref 0.0–0.1)
Basophils Relative: 0 % (ref 0–1)
Eosinophils Absolute: 0.3 10*3/uL (ref 0.0–0.7)
Eosinophils Relative: 3 % (ref 0–5)
HCT: 42.9 % (ref 36.0–46.0)
Lymphocytes Relative: 20 % (ref 12–46)
MCH: 29.5 pg (ref 26.0–34.0)
MCHC: 34.5 g/dL (ref 30.0–36.0)
MCV: 85.5 fL (ref 78.0–100.0)
Monocytes Absolute: 0.8 10*3/uL (ref 0.1–1.0)
Platelets: 266 10*3/uL (ref 150–400)
RDW: 13.2 % (ref 11.5–15.5)

## 2013-01-23 LAB — URINALYSIS, ROUTINE W REFLEX MICROSCOPIC
Bilirubin Urine: NEGATIVE
Ketones, ur: NEGATIVE mg/dL
Leukocytes, UA: NEGATIVE
Nitrite: NEGATIVE
Protein, ur: NEGATIVE mg/dL
Urobilinogen, UA: 1 mg/dL (ref 0.0–1.0)
pH: 7 (ref 5.0–8.0)

## 2013-01-23 LAB — COMPREHENSIVE METABOLIC PANEL
AST: 25 U/L (ref 0–37)
CO2: 23 mEq/L (ref 19–32)
Calcium: 10 mg/dL (ref 8.4–10.5)
Creatinine, Ser: 0.79 mg/dL (ref 0.50–1.10)
GFR calc non Af Amer: 76 mL/min — ABNORMAL LOW (ref >90)
Sodium: 127 mEq/L — ABNORMAL LOW (ref 135–145)
Total Protein: 7.7 g/dL (ref 6.0–8.3)

## 2013-01-23 LAB — TROPONIN I: Troponin I: <0.3 ng/mL (ref <0.30)

## 2013-01-23 LAB — POCT I-STAT TROPONIN I

## 2013-01-23 MED ORDER — ONDANSETRON HCL 4 MG/2ML IJ SOLN
4.0000 mg | Freq: Once | INTRAMUSCULAR | Status: AC
  Administered 2013-01-23: 4 mg via INTRAVENOUS
  Filled 2013-01-23: qty 2

## 2013-01-23 MED ORDER — SODIUM CHLORIDE 0.9 % IV BOLUS (SEPSIS)
500.0000 mL | Freq: Once | INTRAVENOUS | Status: AC
  Administered 2013-01-23: 500 mL via INTRAVENOUS

## 2013-01-23 NOTE — ED Provider Notes (Signed)
CSN: [id number]     Arrival date & time 01/23/13  1643 History   First MD Initiated Contact with Patient 01/23/13 1644     Chief Complaint  Patient presents with  . Chest Pain  . Shortness of Breath   (Consider location/radiation/quality/duration/timing/severity/associated sxs/prior Treatment) Patient is a 79 y.o. female presenting with chest pain and shortness of breath. The history is provided by the patient, medical records and a relative. No language interpreter was used.  Chest Pain Associated symptoms: headache and shortness of breath   Associated symptoms: no abdominal pain, no back pain, no cough, no diaphoresis, no fatigue, no fever, no nausea and not vomiting   Shortness of Breath Associated symptoms: chest pain and headaches   Associated symptoms: no abdominal pain, no cough, no diaphoresis, no fever, no rash, no vomiting and no wheezing     Rebekah Bush is a 79 y.o. female  with a hx of hypertension, gout presents to the Emergency Department complaining of gradual, persistent, progressively worsening nausea with associated headaches onset one week ago after taking Bactrim for UTI for several days.  Patient reports this afternoon she had increasing shortness of breath which she treated to this medication use.  About 15 minutes prior to arrival patient developed chest "discomfort" in the left arm paresthesias and heaviness.  Patient denies vomiting or diarrhea, fevers or chills, abdominal pain, weakness, dizziness, syncope.  Patient reports her chest pain and left arm "numbness" have resolved but her shortness of breath persists.     Past Medical History  Diagnosis Date  . Gout   . Hypertension    Past Surgical History  Procedure Laterality Date  . Cholecystectomy    . Retinal detachment surgery     History reviewed. No pertinent family history. History  Substance Use Topics  . Smoking status: Never Smoker   . Smokeless tobacco: Not on file  . Alcohol Use: Yes   OB  History   Grav Para Term Preterm Abortions TAB SAB Ect Mult Living                 Review of Systems  Constitutional: Negative for fever, diaphoresis, appetite change, fatigue and unexpected weight change.  HENT: Negative for mouth sores and neck stiffness.   Eyes: Negative for visual disturbance.  Respiratory: Positive for shortness of breath. Negative for cough, chest tightness and wheezing.   Cardiovascular: Positive for chest pain.  Gastrointestinal: Negative for nausea, vomiting, abdominal pain, diarrhea and constipation.  Endocrine: Negative for polydipsia, polyphagia and polyuria.  Genitourinary: Positive for decreased urine volume. Negative for dysuria, urgency, frequency and hematuria.  Musculoskeletal: Positive for arthralgias (L arm paresthesias). Negative for back pain.  Skin: Negative for rash.  Allergic/Immunologic: Negative for immunocompromised state.  Neurological: Positive for headaches. Negative for syncope and light-headedness.  Hematological: Does not bruise/bleed easily.  Psychiatric/Behavioral: Negative for sleep disturbance. The patient is not nervous/anxious.     Allergies  Sulfa antibiotics  Home Medications   Current Outpatient Rx  Name  Route  Sig  Dispense  Refill  . Aspirin-Acetaminophen-Caffeine (GOODY HEADACHE PO)   Oral   Take 1 Package by mouth every 6 (six) hours as needed (pain).         Marland Kitchen levothyroxine (SYNTHROID, LEVOTHROID) 75 MCG tablet   Oral   Take 75 mcg by mouth daily.         . metoprolol succinate (TOPROL-XL) 100 MG 24 hr tablet   Oral   Take 100 mg by mouth daily.  Take with or immediately following a meal.         . quinapril (ACCUPRIL) 20 MG tablet   Oral   Take 20 mg by mouth at bedtime.         . triamterene-hydrochlorothiazide (MAXZIDE-25) 37.5-25 MG per tablet   Oral   Take 1 tablet by mouth daily.          BP 193/84  Pulse 71  Temp(Src) 97.5 F (36.4 C) (Oral)  Resp 19  SpO2 100% Physical Exam   Nursing note and vitals reviewed. Constitutional: She is oriented to person, place, and time. She appears well-developed and well-nourished. No distress.  Awake, alert, nontoxic appearance  HENT:  Head: Normocephalic and atraumatic.  Mouth/Throat: Oropharynx is clear and moist. No oropharyngeal exudate.  Eyes: Conjunctivae are normal. Pupils are equal, round, and reactive to light. No scleral icterus.  Neck: Normal range of motion. Neck supple.  Cardiovascular: Normal rate, regular rhythm, normal heart sounds and intact distal pulses.   No murmur heard. Pulmonary/Chest: Effort normal and breath sounds normal. No respiratory distress. She has no wheezes. She has no rales.  Abdominal: Soft. Bowel sounds are normal. She exhibits no distension. There is no tenderness. There is no rebound.  Musculoskeletal: Normal range of motion. She exhibits no edema.  Lymphadenopathy:    She has no cervical adenopathy.  Neurological: She is alert and oriented to person, place, and time. She exhibits normal muscle tone. Coordination normal.  Speech is clear and goal oriented Moves extremities without ataxia  Skin: Skin is warm and dry. No rash noted. She is not diaphoretic. No erythema.  Psychiatric: Her behavior is normal. Her mood appears anxious.  Pt anxious and tearful    ED Course  Procedures (including critical care time) Labs Review Labs Reviewed  COMPREHENSIVE METABOLIC PANEL - Abnormal; Notable for the following:    Sodium 127 (*)    Chloride 88 (*)    Glucose, Bld 113 (*)    GFR calc non Af Amer 76 (*)    GFR calc Af Amer 88 (*)    All other components within normal limits  TROPONIN I  CBC WITH DIFFERENTIAL  URINALYSIS, ROUTINE W REFLEX MICROSCOPIC  TROPONIN I  POCT I-STAT TROPONIN I   Imaging Review Dg Chest 2 View  01/23/2013   CLINICAL DATA:  Chest pain, shortness of breath  EXAM: CHEST  2 VIEW  COMPARISON:  None.  FINDINGS: Cardiac and mediastinal contours are within normal  limits. The lungs are mildly hyperinflated. Central bronchitic changes and prominent interstitial markings appear chronic. Atherosclerotic calcification noted in the transverse aorta. Mild degenerative change remaining right acromioclavicular joint. Negative for focal airspace consolidation, pleural effusion, pneumothorax or suspicious pulmonary nodule. No acute osseous abnormality. Multilevel degenerative change throughout the visualized thoracic spine. .  IMPRESSION: 1. No active cardiopulmonary disease. 2. Pulmonary hyperexpansion and central bronchitic changes suggest underlying COPD 3. Aortic atherosclerosis   Electronically Signed   By: Malachy Moan M.D.   On: 01/23/2013 17:29    ECG:  Date: 01/23/2013  Rate: 71  Rhythm: normal sinus rhythm  QRS Axis: normal  Intervals: normal  ST/T Wave abnormalities: ST depressions anteriorly  Conduction Disutrbances:none  Narrative Interpretation: ST depression in V3, questionably V4, unchanged from comparison on 08-19-2011  Old EKG Reviewed: unchanged    MDM   1. Chest pain   2. SOB (shortness of breath)   3. Nausea alone      Rebekah Bush presents with headaches, nausea and  generalized weakness after beginning Bactrim approximately one week ago. Patient with sudden onset chest pain and shortness of breath approximately 15 minutes prior to arrival. Patient denies cardiac history.  Patient is pain-free at this time. EKG concerning but unchanged from previous.  Will obtain labs and imaging.  Concern for possible acute kidney injury from the Bactrim use and/or acute coronary syndrome.   9:21 PM Patient with second troponin negative.  There is no evidence of urinary tract infection.  We'll discharge home with primary care and cardiology followup. Patient is alert, oriented, nontoxic, nonseptic appearing. Chest pain shortness of breath have resolved completely here in the emergency department.  Patient is to be discharged with recommendation to  follow up with PCP in regards to today's hospital visit. Chest pain is not likely of cardiac or pulmonary etiology d/t presentation, VSS, no tracheal deviation, no JVD or new murmur, RRR, breath sounds equal bilaterally, EKG without acute abnormalities, negative troponin, and negative CXR. Pt has been advised to return to the ED if CP becomes exertional, associated with diaphoresis or nausea, radiates to left jaw/arm, worsens or becomes concerning in any way. Pt appears reliable for follow up and is agreeable to discharge.   Case has been discussed with and seen by Dr. Blinda Leatherwood who agrees with the above plan to discharge.   Dahlia Client Amri Lien, PA-C 01/23/13 2123

## 2013-01-23 NOTE — ED Provider Notes (Signed)
Medical screening examination/treatment/procedure(s) were conducted as a shared visit with non-physician practitioner(s) and myself.  I personally evaluated the patient during the encounter  Patient presents to ER with multiple symptoms that she feels are consistent with previous sulfa medication administration. Patient was recently treated for UTI and was changed to sulfa which response. Patient is very anxious arrival. She did have some chest pain which seems very atypical. Cardiac workup including 2 troponins was negative. Patient appropriate for outpatient follow up.  Gilda Crease, MD 01/23/13 2131

## 2013-01-23 NOTE — ED Notes (Signed)
Pt presents to ed with c/o chest pain and SOB.

## 2013-01-25 DIAGNOSIS — N390 Urinary tract infection, site not specified: Secondary | ICD-10-CM | POA: Diagnosis not present

## 2013-02-15 DIAGNOSIS — M21619 Bunion of unspecified foot: Secondary | ICD-10-CM | POA: Diagnosis not present

## 2013-02-15 DIAGNOSIS — G5760 Lesion of plantar nerve, unspecified lower limb: Secondary | ICD-10-CM | POA: Diagnosis not present

## 2013-02-15 DIAGNOSIS — M25579 Pain in unspecified ankle and joints of unspecified foot: Secondary | ICD-10-CM | POA: Diagnosis not present

## 2013-02-15 DIAGNOSIS — M79609 Pain in unspecified limb: Secondary | ICD-10-CM | POA: Diagnosis not present

## 2013-02-23 DIAGNOSIS — M779 Enthesopathy, unspecified: Secondary | ICD-10-CM | POA: Diagnosis not present

## 2013-02-23 DIAGNOSIS — M65979 Unspecified synovitis and tenosynovitis, unspecified ankle and foot: Secondary | ICD-10-CM | POA: Diagnosis not present

## 2013-04-02 DIAGNOSIS — Z85828 Personal history of other malignant neoplasm of skin: Secondary | ICD-10-CM | POA: Diagnosis not present

## 2013-04-02 DIAGNOSIS — L570 Actinic keratosis: Secondary | ICD-10-CM | POA: Diagnosis not present

## 2013-05-14 DIAGNOSIS — L918 Other hypertrophic disorders of the skin: Secondary | ICD-10-CM | POA: Diagnosis not present

## 2013-05-21 DIAGNOSIS — M899 Disorder of bone, unspecified: Secondary | ICD-10-CM | POA: Diagnosis not present

## 2013-05-21 DIAGNOSIS — M949 Disorder of cartilage, unspecified: Secondary | ICD-10-CM | POA: Diagnosis not present

## 2013-05-21 DIAGNOSIS — E039 Hypothyroidism, unspecified: Secondary | ICD-10-CM | POA: Diagnosis not present

## 2013-05-21 DIAGNOSIS — Z0000 Encounter for general adult medical examination without abnormal findings: Secondary | ICD-10-CM | POA: Diagnosis not present

## 2013-05-21 DIAGNOSIS — I10 Essential (primary) hypertension: Secondary | ICD-10-CM | POA: Diagnosis not present

## 2013-06-11 DIAGNOSIS — Z85828 Personal history of other malignant neoplasm of skin: Secondary | ICD-10-CM | POA: Diagnosis not present

## 2013-06-27 DIAGNOSIS — Z1231 Encounter for screening mammogram for malignant neoplasm of breast: Secondary | ICD-10-CM | POA: Diagnosis not present

## 2013-06-27 DIAGNOSIS — M899 Disorder of bone, unspecified: Secondary | ICD-10-CM | POA: Diagnosis not present

## 2013-06-27 DIAGNOSIS — M949 Disorder of cartilage, unspecified: Secondary | ICD-10-CM | POA: Diagnosis not present

## 2013-11-19 DIAGNOSIS — E559 Vitamin D deficiency, unspecified: Secondary | ICD-10-CM | POA: Diagnosis not present

## 2013-11-19 DIAGNOSIS — I10 Essential (primary) hypertension: Secondary | ICD-10-CM | POA: Diagnosis not present

## 2013-11-19 DIAGNOSIS — Z23 Encounter for immunization: Secondary | ICD-10-CM | POA: Diagnosis not present

## 2013-11-19 DIAGNOSIS — E039 Hypothyroidism, unspecified: Secondary | ICD-10-CM | POA: Diagnosis not present

## 2013-12-13 DIAGNOSIS — I8000 Phlebitis and thrombophlebitis of superficial vessels of unspecified lower extremity: Secondary | ICD-10-CM | POA: Diagnosis not present

## 2013-12-13 DIAGNOSIS — M79609 Pain in unspecified limb: Secondary | ICD-10-CM | POA: Diagnosis not present

## 2014-01-03 DIAGNOSIS — I8310 Varicose veins of unspecified lower extremity with inflammation: Secondary | ICD-10-CM | POA: Diagnosis not present

## 2014-01-03 DIAGNOSIS — I8000 Phlebitis and thrombophlebitis of superficial vessels of unspecified lower extremity: Secondary | ICD-10-CM | POA: Diagnosis not present

## 2014-01-08 DIAGNOSIS — L821 Other seborrheic keratosis: Secondary | ICD-10-CM | POA: Diagnosis not present

## 2014-01-08 DIAGNOSIS — L578 Other skin changes due to chronic exposure to nonionizing radiation: Secondary | ICD-10-CM | POA: Diagnosis not present

## 2014-03-19 DIAGNOSIS — I83811 Varicose veins of right lower extremities with pain: Secondary | ICD-10-CM | POA: Diagnosis not present

## 2014-03-19 DIAGNOSIS — I8003 Phlebitis and thrombophlebitis of superficial vessels of lower extremities, bilateral: Secondary | ICD-10-CM | POA: Diagnosis not present

## 2014-03-19 DIAGNOSIS — I8311 Varicose veins of right lower extremity with inflammation: Secondary | ICD-10-CM | POA: Diagnosis not present

## 2014-04-02 DIAGNOSIS — L578 Other skin changes due to chronic exposure to nonionizing radiation: Secondary | ICD-10-CM | POA: Diagnosis not present

## 2014-04-08 DIAGNOSIS — I8311 Varicose veins of right lower extremity with inflammation: Secondary | ICD-10-CM | POA: Diagnosis not present

## 2014-04-08 DIAGNOSIS — I8001 Phlebitis and thrombophlebitis of superficial vessels of right lower extremity: Secondary | ICD-10-CM | POA: Diagnosis not present

## 2014-04-10 DIAGNOSIS — I8001 Phlebitis and thrombophlebitis of superficial vessels of right lower extremity: Secondary | ICD-10-CM | POA: Diagnosis not present

## 2014-04-10 DIAGNOSIS — I8311 Varicose veins of right lower extremity with inflammation: Secondary | ICD-10-CM | POA: Diagnosis not present

## 2014-04-23 DIAGNOSIS — I8311 Varicose veins of right lower extremity with inflammation: Secondary | ICD-10-CM | POA: Diagnosis not present

## 2014-04-23 DIAGNOSIS — M79651 Pain in right thigh: Secondary | ICD-10-CM | POA: Diagnosis not present

## 2014-04-23 DIAGNOSIS — M79604 Pain in right leg: Secondary | ICD-10-CM | POA: Diagnosis not present

## 2014-05-06 DIAGNOSIS — M7981 Nontraumatic hematoma of soft tissue: Secondary | ICD-10-CM | POA: Diagnosis not present

## 2014-05-06 DIAGNOSIS — M79604 Pain in right leg: Secondary | ICD-10-CM | POA: Diagnosis not present

## 2014-05-06 DIAGNOSIS — I8311 Varicose veins of right lower extremity with inflammation: Secondary | ICD-10-CM | POA: Diagnosis not present

## 2014-05-06 DIAGNOSIS — M79651 Pain in right thigh: Secondary | ICD-10-CM | POA: Diagnosis not present

## 2014-05-20 DIAGNOSIS — M7981 Nontraumatic hematoma of soft tissue: Secondary | ICD-10-CM | POA: Diagnosis not present

## 2014-05-20 DIAGNOSIS — I8311 Varicose veins of right lower extremity with inflammation: Secondary | ICD-10-CM | POA: Diagnosis not present

## 2014-05-20 DIAGNOSIS — M79651 Pain in right thigh: Secondary | ICD-10-CM | POA: Diagnosis not present

## 2014-05-20 DIAGNOSIS — M79604 Pain in right leg: Secondary | ICD-10-CM | POA: Diagnosis not present

## 2014-05-22 DIAGNOSIS — E039 Hypothyroidism, unspecified: Secondary | ICD-10-CM | POA: Diagnosis not present

## 2014-05-22 DIAGNOSIS — I10 Essential (primary) hypertension: Secondary | ICD-10-CM | POA: Diagnosis not present

## 2014-05-22 DIAGNOSIS — Z23 Encounter for immunization: Secondary | ICD-10-CM | POA: Diagnosis not present

## 2014-05-22 DIAGNOSIS — Z0000 Encounter for general adult medical examination without abnormal findings: Secondary | ICD-10-CM | POA: Diagnosis not present

## 2014-05-22 DIAGNOSIS — M810 Age-related osteoporosis without current pathological fracture: Secondary | ICD-10-CM | POA: Diagnosis not present

## 2014-06-05 DIAGNOSIS — M7981 Nontraumatic hematoma of soft tissue: Secondary | ICD-10-CM | POA: Diagnosis not present

## 2014-06-05 DIAGNOSIS — I8311 Varicose veins of right lower extremity with inflammation: Secondary | ICD-10-CM | POA: Diagnosis not present

## 2014-06-05 DIAGNOSIS — M79604 Pain in right leg: Secondary | ICD-10-CM | POA: Diagnosis not present

## 2014-06-05 DIAGNOSIS — M79651 Pain in right thigh: Secondary | ICD-10-CM | POA: Diagnosis not present

## 2014-08-19 DIAGNOSIS — I83811 Varicose veins of right lower extremities with pain: Secondary | ICD-10-CM | POA: Diagnosis not present

## 2014-09-04 DIAGNOSIS — H109 Unspecified conjunctivitis: Secondary | ICD-10-CM | POA: Diagnosis not present

## 2014-09-12 DIAGNOSIS — Z1231 Encounter for screening mammogram for malignant neoplasm of breast: Secondary | ICD-10-CM | POA: Diagnosis not present

## 2014-09-12 DIAGNOSIS — Z8041 Family history of malignant neoplasm of ovary: Secondary | ICD-10-CM | POA: Diagnosis not present

## 2014-11-19 DIAGNOSIS — I8311 Varicose veins of right lower extremity with inflammation: Secondary | ICD-10-CM | POA: Diagnosis not present

## 2014-11-20 DIAGNOSIS — M810 Age-related osteoporosis without current pathological fracture: Secondary | ICD-10-CM | POA: Diagnosis not present

## 2014-11-20 DIAGNOSIS — E039 Hypothyroidism, unspecified: Secondary | ICD-10-CM | POA: Diagnosis not present

## 2014-11-20 DIAGNOSIS — E559 Vitamin D deficiency, unspecified: Secondary | ICD-10-CM | POA: Diagnosis not present

## 2014-11-20 DIAGNOSIS — I10 Essential (primary) hypertension: Secondary | ICD-10-CM | POA: Diagnosis not present

## 2015-02-27 DIAGNOSIS — Z23 Encounter for immunization: Secondary | ICD-10-CM | POA: Diagnosis not present

## 2015-02-27 DIAGNOSIS — I10 Essential (primary) hypertension: Secondary | ICD-10-CM | POA: Diagnosis not present

## 2015-02-27 DIAGNOSIS — E559 Vitamin D deficiency, unspecified: Secondary | ICD-10-CM | POA: Diagnosis not present

## 2015-02-27 DIAGNOSIS — M810 Age-related osteoporosis without current pathological fracture: Secondary | ICD-10-CM | POA: Diagnosis not present

## 2015-02-27 DIAGNOSIS — E039 Hypothyroidism, unspecified: Secondary | ICD-10-CM | POA: Diagnosis not present

## 2015-03-20 DIAGNOSIS — H338 Other retinal detachments: Secondary | ICD-10-CM | POA: Diagnosis not present

## 2015-03-20 DIAGNOSIS — H353190 Nonexudative age-related macular degeneration, unspecified eye, stage unspecified: Secondary | ICD-10-CM | POA: Diagnosis not present

## 2015-05-15 DIAGNOSIS — L821 Other seborrheic keratosis: Secondary | ICD-10-CM | POA: Diagnosis not present

## 2015-05-15 DIAGNOSIS — Z85828 Personal history of other malignant neoplasm of skin: Secondary | ICD-10-CM | POA: Diagnosis not present

## 2015-05-15 DIAGNOSIS — L218 Other seborrheic dermatitis: Secondary | ICD-10-CM | POA: Diagnosis not present

## 2015-05-26 DIAGNOSIS — I10 Essential (primary) hypertension: Secondary | ICD-10-CM | POA: Diagnosis not present

## 2015-05-26 DIAGNOSIS — E039 Hypothyroidism, unspecified: Secondary | ICD-10-CM | POA: Diagnosis not present

## 2015-05-26 DIAGNOSIS — M810 Age-related osteoporosis without current pathological fracture: Secondary | ICD-10-CM | POA: Diagnosis not present

## 2015-05-26 DIAGNOSIS — E559 Vitamin D deficiency, unspecified: Secondary | ICD-10-CM | POA: Diagnosis not present

## 2015-05-26 DIAGNOSIS — Z0000 Encounter for general adult medical examination without abnormal findings: Secondary | ICD-10-CM | POA: Diagnosis not present

## 2015-11-25 DIAGNOSIS — I10 Essential (primary) hypertension: Secondary | ICD-10-CM | POA: Diagnosis not present

## 2015-11-25 DIAGNOSIS — E559 Vitamin D deficiency, unspecified: Secondary | ICD-10-CM | POA: Diagnosis not present

## 2015-11-25 DIAGNOSIS — M810 Age-related osteoporosis without current pathological fracture: Secondary | ICD-10-CM | POA: Diagnosis not present

## 2015-11-25 DIAGNOSIS — E039 Hypothyroidism, unspecified: Secondary | ICD-10-CM | POA: Diagnosis not present

## 2016-05-17 DIAGNOSIS — R6889 Other general symptoms and signs: Secondary | ICD-10-CM | POA: Diagnosis not present

## 2016-05-17 DIAGNOSIS — Z20828 Contact with and (suspected) exposure to other viral communicable diseases: Secondary | ICD-10-CM | POA: Diagnosis not present

## 2016-09-14 DIAGNOSIS — R600 Localized edema: Secondary | ICD-10-CM | POA: Diagnosis not present

## 2016-09-15 DIAGNOSIS — I8311 Varicose veins of right lower extremity with inflammation: Secondary | ICD-10-CM | POA: Diagnosis not present

## 2016-09-17 DIAGNOSIS — M810 Age-related osteoporosis without current pathological fracture: Secondary | ICD-10-CM | POA: Diagnosis not present

## 2016-09-17 DIAGNOSIS — I10 Essential (primary) hypertension: Secondary | ICD-10-CM | POA: Diagnosis not present

## 2016-09-17 DIAGNOSIS — I8393 Asymptomatic varicose veins of bilateral lower extremities: Secondary | ICD-10-CM | POA: Diagnosis not present

## 2016-09-17 DIAGNOSIS — Z0000 Encounter for general adult medical examination without abnormal findings: Secondary | ICD-10-CM | POA: Diagnosis not present

## 2016-09-17 DIAGNOSIS — E039 Hypothyroidism, unspecified: Secondary | ICD-10-CM | POA: Diagnosis not present

## 2016-09-17 DIAGNOSIS — E559 Vitamin D deficiency, unspecified: Secondary | ICD-10-CM | POA: Diagnosis not present

## 2016-09-17 DIAGNOSIS — M109 Gout, unspecified: Secondary | ICD-10-CM | POA: Diagnosis not present

## 2016-09-17 DIAGNOSIS — R600 Localized edema: Secondary | ICD-10-CM | POA: Diagnosis not present

## 2016-09-30 DIAGNOSIS — M109 Gout, unspecified: Secondary | ICD-10-CM | POA: Diagnosis not present

## 2016-09-30 DIAGNOSIS — R531 Weakness: Secondary | ICD-10-CM | POA: Diagnosis not present

## 2016-09-30 DIAGNOSIS — R110 Nausea: Secondary | ICD-10-CM | POA: Diagnosis not present

## 2016-09-30 DIAGNOSIS — R5383 Other fatigue: Secondary | ICD-10-CM | POA: Diagnosis not present

## 2016-09-30 DIAGNOSIS — M1990 Unspecified osteoarthritis, unspecified site: Secondary | ICD-10-CM | POA: Diagnosis not present

## 2016-10-04 DIAGNOSIS — E871 Hypo-osmolality and hyponatremia: Secondary | ICD-10-CM | POA: Diagnosis not present

## 2016-11-02 DIAGNOSIS — M109 Gout, unspecified: Secondary | ICD-10-CM | POA: Diagnosis not present

## 2016-11-02 DIAGNOSIS — M810 Age-related osteoporosis without current pathological fracture: Secondary | ICD-10-CM | POA: Diagnosis not present

## 2016-11-02 DIAGNOSIS — E039 Hypothyroidism, unspecified: Secondary | ICD-10-CM | POA: Diagnosis not present

## 2016-11-02 DIAGNOSIS — E871 Hypo-osmolality and hyponatremia: Secondary | ICD-10-CM | POA: Diagnosis not present

## 2016-11-02 DIAGNOSIS — I10 Essential (primary) hypertension: Secondary | ICD-10-CM | POA: Diagnosis not present

## 2016-11-02 DIAGNOSIS — M1990 Unspecified osteoarthritis, unspecified site: Secondary | ICD-10-CM | POA: Diagnosis not present

## 2016-11-02 DIAGNOSIS — E559 Vitamin D deficiency, unspecified: Secondary | ICD-10-CM | POA: Diagnosis not present

## 2016-12-06 DIAGNOSIS — E039 Hypothyroidism, unspecified: Secondary | ICD-10-CM | POA: Diagnosis not present

## 2016-12-06 DIAGNOSIS — E871 Hypo-osmolality and hyponatremia: Secondary | ICD-10-CM | POA: Diagnosis not present

## 2016-12-06 DIAGNOSIS — R899 Unspecified abnormal finding in specimens from other organs, systems and tissues: Secondary | ICD-10-CM | POA: Diagnosis not present

## 2016-12-06 DIAGNOSIS — M1990 Unspecified osteoarthritis, unspecified site: Secondary | ICD-10-CM | POA: Diagnosis not present

## 2016-12-06 DIAGNOSIS — E559 Vitamin D deficiency, unspecified: Secondary | ICD-10-CM | POA: Diagnosis not present

## 2016-12-06 DIAGNOSIS — M109 Gout, unspecified: Secondary | ICD-10-CM | POA: Diagnosis not present

## 2016-12-06 DIAGNOSIS — M810 Age-related osteoporosis without current pathological fracture: Secondary | ICD-10-CM | POA: Diagnosis not present

## 2016-12-06 DIAGNOSIS — I10 Essential (primary) hypertension: Secondary | ICD-10-CM | POA: Diagnosis not present

## 2017-08-19 DIAGNOSIS — L570 Actinic keratosis: Secondary | ICD-10-CM | POA: Diagnosis not present

## 2017-08-19 DIAGNOSIS — L905 Scar conditions and fibrosis of skin: Secondary | ICD-10-CM | POA: Diagnosis not present

## 2017-08-19 DIAGNOSIS — D485 Neoplasm of uncertain behavior of skin: Secondary | ICD-10-CM | POA: Diagnosis not present

## 2017-08-19 DIAGNOSIS — C44329 Squamous cell carcinoma of skin of other parts of face: Secondary | ICD-10-CM | POA: Diagnosis not present

## 2017-10-10 DIAGNOSIS — M1990 Unspecified osteoarthritis, unspecified site: Secondary | ICD-10-CM | POA: Diagnosis not present

## 2017-10-10 DIAGNOSIS — R899 Unspecified abnormal finding in specimens from other organs, systems and tissues: Secondary | ICD-10-CM | POA: Diagnosis not present

## 2017-10-10 DIAGNOSIS — M109 Gout, unspecified: Secondary | ICD-10-CM | POA: Diagnosis not present

## 2017-10-10 DIAGNOSIS — M810 Age-related osteoporosis without current pathological fracture: Secondary | ICD-10-CM | POA: Diagnosis not present

## 2017-10-10 DIAGNOSIS — E039 Hypothyroidism, unspecified: Secondary | ICD-10-CM | POA: Diagnosis not present

## 2017-10-10 DIAGNOSIS — I10 Essential (primary) hypertension: Secondary | ICD-10-CM | POA: Diagnosis not present

## 2017-10-10 DIAGNOSIS — Z0000 Encounter for general adult medical examination without abnormal findings: Secondary | ICD-10-CM | POA: Diagnosis not present

## 2017-10-10 DIAGNOSIS — E871 Hypo-osmolality and hyponatremia: Secondary | ICD-10-CM | POA: Diagnosis not present

## 2017-10-10 DIAGNOSIS — E559 Vitamin D deficiency, unspecified: Secondary | ICD-10-CM | POA: Diagnosis not present

## 2017-10-25 DIAGNOSIS — R55 Syncope and collapse: Secondary | ICD-10-CM | POA: Diagnosis not present

## 2017-10-25 DIAGNOSIS — Z79899 Other long term (current) drug therapy: Secondary | ICD-10-CM | POA: Diagnosis not present

## 2017-10-25 DIAGNOSIS — E876 Hypokalemia: Secondary | ICD-10-CM | POA: Diagnosis not present

## 2017-10-25 DIAGNOSIS — R9431 Abnormal electrocardiogram [ECG] [EKG]: Secondary | ICD-10-CM | POA: Diagnosis not present

## 2017-10-25 DIAGNOSIS — R0789 Other chest pain: Secondary | ICD-10-CM | POA: Diagnosis not present

## 2017-10-25 DIAGNOSIS — Z882 Allergy status to sulfonamides status: Secondary | ICD-10-CM | POA: Diagnosis not present

## 2017-10-25 DIAGNOSIS — E039 Hypothyroidism, unspecified: Secondary | ICD-10-CM | POA: Diagnosis present

## 2017-10-25 DIAGNOSIS — Z8249 Family history of ischemic heart disease and other diseases of the circulatory system: Secondary | ICD-10-CM | POA: Diagnosis not present

## 2017-10-25 DIAGNOSIS — I2510 Atherosclerotic heart disease of native coronary artery without angina pectoris: Secondary | ICD-10-CM | POA: Diagnosis present

## 2017-10-25 DIAGNOSIS — E871 Hypo-osmolality and hyponatremia: Secondary | ICD-10-CM | POA: Diagnosis not present

## 2017-10-25 DIAGNOSIS — Z888 Allergy status to other drugs, medicaments and biological substances status: Secondary | ICD-10-CM | POA: Diagnosis not present

## 2017-10-25 DIAGNOSIS — Z9049 Acquired absence of other specified parts of digestive tract: Secondary | ICD-10-CM | POA: Diagnosis not present

## 2017-10-25 DIAGNOSIS — J9602 Acute respiratory failure with hypercapnia: Secondary | ICD-10-CM | POA: Diagnosis not present

## 2017-10-25 DIAGNOSIS — Z87891 Personal history of nicotine dependence: Secondary | ICD-10-CM | POA: Diagnosis not present

## 2017-10-25 DIAGNOSIS — I491 Atrial premature depolarization: Secondary | ICD-10-CM | POA: Diagnosis not present

## 2017-10-25 DIAGNOSIS — R079 Chest pain, unspecified: Secondary | ICD-10-CM | POA: Diagnosis not present

## 2017-10-25 DIAGNOSIS — Z881 Allergy status to other antibiotic agents status: Secondary | ICD-10-CM | POA: Diagnosis not present

## 2017-10-25 DIAGNOSIS — Z79890 Hormone replacement therapy (postmenopausal): Secondary | ICD-10-CM | POA: Diagnosis not present

## 2017-10-25 DIAGNOSIS — I10 Essential (primary) hypertension: Secondary | ICD-10-CM | POA: Diagnosis not present

## 2017-11-14 DIAGNOSIS — I10 Essential (primary) hypertension: Secondary | ICD-10-CM | POA: Diagnosis not present

## 2017-11-14 DIAGNOSIS — R601 Generalized edema: Secondary | ICD-10-CM | POA: Diagnosis not present

## 2017-11-14 DIAGNOSIS — M1000 Idiopathic gout, unspecified site: Secondary | ICD-10-CM | POA: Diagnosis not present

## 2017-11-14 DIAGNOSIS — E871 Hypo-osmolality and hyponatremia: Secondary | ICD-10-CM | POA: Diagnosis not present

## 2018-05-02 DIAGNOSIS — E559 Vitamin D deficiency, unspecified: Secondary | ICD-10-CM | POA: Diagnosis not present

## 2018-05-02 DIAGNOSIS — E871 Hypo-osmolality and hyponatremia: Secondary | ICD-10-CM | POA: Diagnosis not present

## 2018-05-02 DIAGNOSIS — M810 Age-related osteoporosis without current pathological fracture: Secondary | ICD-10-CM | POA: Diagnosis not present

## 2018-05-02 DIAGNOSIS — N183 Chronic kidney disease, stage 3 (moderate): Secondary | ICD-10-CM | POA: Diagnosis not present

## 2018-05-02 DIAGNOSIS — M7989 Other specified soft tissue disorders: Secondary | ICD-10-CM | POA: Diagnosis not present

## 2018-05-02 DIAGNOSIS — I10 Essential (primary) hypertension: Secondary | ICD-10-CM | POA: Diagnosis not present

## 2018-05-02 DIAGNOSIS — E039 Hypothyroidism, unspecified: Secondary | ICD-10-CM | POA: Diagnosis not present

## 2018-11-08 DIAGNOSIS — R599 Enlarged lymph nodes, unspecified: Secondary | ICD-10-CM | POA: Diagnosis not present

## 2019-01-11 DIAGNOSIS — M1A9XX0 Chronic gout, unspecified, without tophus (tophi): Secondary | ICD-10-CM | POA: Diagnosis not present

## 2019-01-11 DIAGNOSIS — M810 Age-related osteoporosis without current pathological fracture: Secondary | ICD-10-CM | POA: Diagnosis not present

## 2019-01-11 DIAGNOSIS — E039 Hypothyroidism, unspecified: Secondary | ICD-10-CM | POA: Diagnosis not present

## 2019-01-11 DIAGNOSIS — M79604 Pain in right leg: Secondary | ICD-10-CM | POA: Diagnosis not present

## 2019-01-11 DIAGNOSIS — I10 Essential (primary) hypertension: Secondary | ICD-10-CM | POA: Diagnosis not present

## 2019-01-11 DIAGNOSIS — N183 Chronic kidney disease, stage 3 (moderate): Secondary | ICD-10-CM | POA: Diagnosis not present

## 2019-01-11 DIAGNOSIS — E559 Vitamin D deficiency, unspecified: Secondary | ICD-10-CM | POA: Diagnosis not present

## 2019-01-25 DIAGNOSIS — Z23 Encounter for immunization: Secondary | ICD-10-CM | POA: Diagnosis not present

## 2019-03-08 DIAGNOSIS — E039 Hypothyroidism, unspecified: Secondary | ICD-10-CM | POA: Diagnosis not present

## 2019-03-08 DIAGNOSIS — I10 Essential (primary) hypertension: Secondary | ICD-10-CM | POA: Diagnosis not present

## 2019-03-08 DIAGNOSIS — M810 Age-related osteoporosis without current pathological fracture: Secondary | ICD-10-CM | POA: Diagnosis not present

## 2019-03-08 DIAGNOSIS — M1990 Unspecified osteoarthritis, unspecified site: Secondary | ICD-10-CM | POA: Diagnosis not present

## 2019-03-08 DIAGNOSIS — N183 Chronic kidney disease, stage 3 unspecified: Secondary | ICD-10-CM | POA: Diagnosis not present

## 2019-05-07 ENCOUNTER — Emergency Department (HOSPITAL_COMMUNITY): Payer: Medicare Other

## 2019-05-07 ENCOUNTER — Observation Stay (HOSPITAL_COMMUNITY)
Admission: EM | Admit: 2019-05-07 | Discharge: 2019-05-08 | Disposition: A | Discharge status: Home / Self Care | Payer: Medicare Other | Attending: Internal Medicine | Admitting: Internal Medicine

## 2019-05-07 ENCOUNTER — Other Ambulatory Visit: Payer: Self-pay

## 2019-05-07 ENCOUNTER — Encounter (HOSPITAL_COMMUNITY): Payer: Self-pay

## 2019-05-07 DIAGNOSIS — W010XXA Fall on same level from slipping, tripping and stumbling without subsequent striking against object, initial encounter: Secondary | ICD-10-CM | POA: Diagnosis not present

## 2019-05-07 DIAGNOSIS — E039 Hypothyroidism, unspecified: Secondary | ICD-10-CM | POA: Diagnosis present

## 2019-05-07 DIAGNOSIS — I700 Atherosclerosis of aorta: Secondary | ICD-10-CM | POA: Insufficient documentation

## 2019-05-07 DIAGNOSIS — Z20822 Contact with and (suspected) exposure to covid-19: Secondary | ICD-10-CM | POA: Insufficient documentation

## 2019-05-07 DIAGNOSIS — I10 Essential (primary) hypertension: Secondary | ICD-10-CM | POA: Insufficient documentation

## 2019-05-07 DIAGNOSIS — D72829 Elevated white blood cell count, unspecified: Secondary | ICD-10-CM | POA: Diagnosis present

## 2019-05-07 DIAGNOSIS — S32519A Fracture of superior rim of unspecified pubis, initial encounter for closed fracture: Secondary | ICD-10-CM | POA: Diagnosis not present

## 2019-05-07 DIAGNOSIS — Y92009 Unspecified place in unspecified non-institutional (private) residence as the place of occurrence of the external cause: Secondary | ICD-10-CM | POA: Diagnosis not present

## 2019-05-07 DIAGNOSIS — M109 Gout, unspecified: Secondary | ICD-10-CM | POA: Insufficient documentation

## 2019-05-07 DIAGNOSIS — E876 Hypokalemia: Secondary | ICD-10-CM | POA: Diagnosis present

## 2019-05-07 DIAGNOSIS — Z79890 Hormone replacement therapy (postmenopausal): Secondary | ICD-10-CM | POA: Diagnosis not present

## 2019-05-07 DIAGNOSIS — M6282 Rhabdomyolysis: Secondary | ICD-10-CM | POA: Diagnosis not present

## 2019-05-07 DIAGNOSIS — S3282XA Multiple fractures of pelvis without disruption of pelvic ring, initial encounter for closed fracture: Secondary | ICD-10-CM

## 2019-05-07 DIAGNOSIS — M19011 Primary osteoarthritis, right shoulder: Secondary | ICD-10-CM | POA: Diagnosis not present

## 2019-05-07 DIAGNOSIS — S43004A Unspecified dislocation of right shoulder joint, initial encounter: Secondary | ICD-10-CM | POA: Diagnosis not present

## 2019-05-07 DIAGNOSIS — R9431 Abnormal electrocardiogram [ECG] [EKG]: Secondary | ICD-10-CM | POA: Diagnosis not present

## 2019-05-07 DIAGNOSIS — M1611 Unilateral primary osteoarthritis, right hip: Secondary | ICD-10-CM | POA: Diagnosis not present

## 2019-05-07 DIAGNOSIS — S32591A Other specified fracture of right pubis, initial encounter for closed fracture: Secondary | ICD-10-CM | POA: Diagnosis not present

## 2019-05-07 DIAGNOSIS — M25511 Pain in right shoulder: Secondary | ICD-10-CM | POA: Diagnosis not present

## 2019-05-07 DIAGNOSIS — S43014A Anterior dislocation of right humerus, initial encounter: Secondary | ICD-10-CM | POA: Diagnosis not present

## 2019-05-07 DIAGNOSIS — Z79899 Other long term (current) drug therapy: Secondary | ICD-10-CM | POA: Insufficient documentation

## 2019-05-07 DIAGNOSIS — S32511A Fracture of superior rim of right pubis, initial encounter for closed fracture: Secondary | ICD-10-CM | POA: Diagnosis not present

## 2019-05-07 DIAGNOSIS — R52 Pain, unspecified: Secondary | ICD-10-CM | POA: Diagnosis not present

## 2019-05-07 DIAGNOSIS — W19XXXA Unspecified fall, initial encounter: Secondary | ICD-10-CM | POA: Diagnosis not present

## 2019-05-07 DIAGNOSIS — R531 Weakness: Secondary | ICD-10-CM | POA: Diagnosis not present

## 2019-05-07 HISTORY — DX: Unspecified dislocation of right shoulder joint, initial encounter: S43.004A

## 2019-05-07 HISTORY — DX: Hypo-osmolality and hyponatremia: E87.1

## 2019-05-07 HISTORY — DX: Fracture of superior rim of unspecified pubis, initial encounter for closed fracture: S32.519A

## 2019-05-07 LAB — COMPREHENSIVE METABOLIC PANEL
ALT: 40 U/L (ref 0–44)
AST: 94 U/L — ABNORMAL HIGH (ref 15–41)
Albumin: 3.6 g/dL (ref 3.5–5.0)
Alkaline Phosphatase: 67 U/L (ref 38–126)
Anion gap: 11 (ref 5–15)
BUN: 42 mg/dL — ABNORMAL HIGH (ref 8–23)
CO2: 26 mmol/L (ref 22–32)
Calcium: 9.4 mg/dL (ref 8.9–10.3)
Chloride: 102 mmol/L (ref 98–111)
Creatinine, Ser: 0.93 mg/dL (ref 0.44–1.00)
GFR calc Af Amer: >60 mL/min (ref >60)
GFR calc non Af Amer: 55 mL/min — ABNORMAL LOW (ref >60)
Glucose, Bld: 128 mg/dL — ABNORMAL HIGH (ref 70–99)
Potassium: 3 mmol/L — ABNORMAL LOW (ref 3.5–5.1)
Sodium: 139 mmol/L (ref 135–145)
Total Bilirubin: 1.5 mg/dL — ABNORMAL HIGH (ref 0.3–1.2)
Total Protein: 6.5 g/dL (ref 6.5–8.1)

## 2019-05-07 LAB — CBC WITH DIFFERENTIAL/PLATELET
Abs Immature Granulocytes: 0.11 10*3/uL — ABNORMAL HIGH (ref 0.00–0.07)
Basophils Absolute: 0 10*3/uL (ref 0.0–0.1)
Basophils Relative: 0 %
Eosinophils Absolute: 0 10*3/uL (ref 0.0–0.5)
Eosinophils Relative: 0 %
HCT: 46.2 % — ABNORMAL HIGH (ref 36.0–46.0)
Hemoglobin: 15.3 g/dL — ABNORMAL HIGH (ref 12.0–15.0)
Immature Granulocytes: 1 %
Lymphocytes Relative: 3 %
Lymphs Abs: 0.6 10*3/uL — ABNORMAL LOW (ref 0.7–4.0)
MCH: 30.1 pg (ref 26.0–34.0)
MCHC: 33.1 g/dL (ref 30.0–36.0)
MCV: 90.9 fL (ref 80.0–100.0)
Monocytes Absolute: 1.5 10*3/uL — ABNORMAL HIGH (ref 0.1–1.0)
Monocytes Relative: 8 %
Neutro Abs: 16.2 10*3/uL — ABNORMAL HIGH (ref 1.7–7.7)
Neutrophils Relative %: 88 %
Platelets: 252 10*3/uL (ref 150–400)
RBC: 5.08 MIL/uL (ref 3.87–5.11)
RDW: 14.3 % (ref 11.5–15.5)
WBC: 18.4 10*3/uL — ABNORMAL HIGH (ref 4.0–10.5)
nRBC: 0 % (ref 0.0–0.2)

## 2019-05-07 LAB — URINALYSIS, ROUTINE W REFLEX MICROSCOPIC
Bacteria, UA: NONE SEEN
Bilirubin Urine: NEGATIVE
Glucose, UA: NEGATIVE mg/dL
Hgb urine dipstick: NEGATIVE
Ketones, ur: 20 mg/dL — AB
Leukocytes,Ua: NEGATIVE
Nitrite: NEGATIVE
Protein, ur: 100 mg/dL — AB
Specific Gravity, Urine: 1.026 (ref 1.005–1.030)
pH: 6 (ref 5.0–8.0)

## 2019-05-07 LAB — CK: Total CK: 2032 U/L — ABNORMAL HIGH (ref 38–234)

## 2019-05-07 LAB — RESPIRATORY PANEL BY RT PCR (FLU A&B, COVID)
Influenza A by PCR: NEGATIVE
Influenza B by PCR: NEGATIVE
SARS Coronavirus 2 by RT PCR: NEGATIVE

## 2019-05-07 MED ORDER — POTASSIUM CHLORIDE CRYS ER 20 MEQ PO TBCR
40.0000 meq | EXTENDED_RELEASE_TABLET | ORAL | Status: AC
  Administered 2019-05-07: 40 meq via ORAL
  Filled 2019-05-07: qty 2

## 2019-05-07 MED ORDER — ALBUTEROL SULFATE (2.5 MG/3ML) 0.083% IN NEBU: 2.5000 mg | INHALATION_SOLUTION | Freq: Four times a day (QID) | RESPIRATORY_TRACT | Status: DC | PRN

## 2019-05-07 MED ORDER — ONDANSETRON HCL 4 MG PO TABS: 4.0000 mg | ORAL_TABLET | Freq: Four times a day (QID) | ORAL | Status: DC | PRN

## 2019-05-07 MED ORDER — SODIUM CHLORIDE 0.9% FLUSH: 3.0000 mL | Freq: Two times a day (BID) | INTRAVENOUS | Status: DC

## 2019-05-07 MED ORDER — SODIUM CHLORIDE 0.9 % IV BOLUS
500.0000 mL | Freq: Once | INTRAVENOUS | Status: AC
  Administered 2019-05-07: 10:00:00 500 mL via INTRAVENOUS

## 2019-05-07 MED ORDER — ENOXAPARIN SODIUM 40 MG/0.4ML ~~LOC~~ SOLN
40.0000 mg | SUBCUTANEOUS | Status: DC
  Administered 2019-05-07: 40 mg via SUBCUTANEOUS
  Filled 2019-05-07: qty 0.4

## 2019-05-07 MED ORDER — HYDROCODONE-ACETAMINOPHEN 5-325 MG PO TABS
1.0000 | ORAL_TABLET | Freq: Four times a day (QID) | ORAL | Status: DC | PRN
  Administered 2019-05-08 (×2): 1 via ORAL
  Filled 2019-05-07 (×2): qty 1

## 2019-05-07 MED ORDER — PROPOFOL 10 MG/ML IV BOLUS
INTRAVENOUS | Status: AC | PRN
  Administered 2019-05-07: 50 mg via INTRAVENOUS

## 2019-05-07 MED ORDER — FENTANYL CITRATE (PF) 100 MCG/2ML IJ SOLN
25.0000 ug | Freq: Once | INTRAMUSCULAR | Status: AC
  Administered 2019-05-07: 10:00:00 25 ug via INTRAVENOUS
  Filled 2019-05-07: qty 2

## 2019-05-07 MED ORDER — ACETAMINOPHEN 325 MG PO TABS: 650.0000 mg | ORAL_TABLET | Freq: Four times a day (QID) | ORAL | Status: DC | PRN

## 2019-05-07 MED ORDER — SODIUM CHLORIDE 0.9 % IV SOLN: INTRAVENOUS | Status: DC

## 2019-05-07 MED ORDER — PROPOFOL 10 MG/ML IV BOLUS
50.0000 mg | Freq: Once | INTRAVENOUS | Status: AC
  Administered 2019-05-07: 12:00:00 50 mg via INTRAVENOUS
  Filled 2019-05-07: qty 20

## 2019-05-07 MED ORDER — ONDANSETRON HCL 4 MG/2ML IJ SOLN: 4.0000 mg | Freq: Four times a day (QID) | INTRAMUSCULAR | Status: DC | PRN

## 2019-05-07 MED ORDER — ACETAMINOPHEN 650 MG RE SUPP: 650.0000 mg | Freq: Four times a day (QID) | RECTAL | Status: DC | PRN

## 2019-05-07 NOTE — ED Triage Notes (Signed)
Per EMS- patient fell 2 days ago and laid on the floor for 24 hours when the family found her. EMS was called yesterday and evaluated. Today, the patient c/o right upper arm and shoulder pain, weakness in her lower extremities, right thigh pain.  Patient stated she was changing her sheets and her legs felt weak when she fell 2 days ago.

## 2019-05-07 NOTE — ED Notes (Signed)
Pt provided verbal consent to update daughter on pt condition.

## 2019-05-07 NOTE — ED Notes (Signed)
Pt transported to and from Xray.

## 2019-05-07 NOTE — ED Provider Notes (Signed)
.  Sedation  Date/Time: 05/07/2019 4:31 PM Performed by: Lennice Sites, DO Authorized by: Lennice Sites, DO   Consent:    Consent obtained:  Verbal   Consent given by:  Patient   Risks discussed:  Allergic reaction, dysrhythmia, inadequate sedation, nausea, prolonged hypoxia resulting in organ damage, prolonged sedation necessitating reversal, respiratory compromise necessitating ventilatory assistance and intubation and vomiting   Alternatives discussed:  Analgesia without sedation, anxiolysis and regional anesthesia Universal protocol:    Procedure explained and questions answered to patient or proxy's satisfaction: yes     Relevant documents present and verified: yes     Test results available and properly labeled: yes     Imaging studies available: yes     Required blood products, implants, devices, and special equipment available: yes     Site/side marked: yes     Immediately prior to procedure a time out was called: yes     Patient identity confirmation method:  Verbally with patient Indications:    Procedure necessitating sedation performed by:  Physician performing sedation Pre-sedation assessment:    Time since last food or drink:  3   ASA classification: class 2 - patient with mild systemic disease     Neck mobility: normal     Mouth opening:  3 or more finger widths   Thyromental distance:  4 finger widths   Mallampati score:  I - soft palate, uvula, fauces, pillars visible   Pre-sedation assessments completed and reviewed: airway patency, cardiovascular function, hydration status, mental status, nausea/vomiting, pain level, respiratory function and temperature     Pre-sedation assessment completed:  05/07/2019 11:00 AM Immediate pre-procedure details:    Reassessment: Patient reassessed immediately prior to procedure     Reviewed: vital signs, relevant labs/tests and NPO status     Verified: bag valve mask available, emergency equipment available, intubation equipment  available, IV patency confirmed, oxygen available and suction available   Procedure details (see MAR for exact dosages):    Preoxygenation:  Nasal cannula   Sedation:  Propofol   Intended level of sedation: deep   Intra-procedure monitoring:  Blood pressure monitoring, cardiac monitor, continuous pulse oximetry, frequent LOC assessments, frequent vital sign checks and continuous capnometry   Intra-procedure events: none     Intra-procedure management:  Supplemental oxygen   Total Provider sedation time (minutes):  17 Post-procedure details:    Post-sedation assessment completed:  05/07/2019 12:30 PM   Attendance: Constant attendance by certified staff until patient recovered     Recovery: Patient returned to pre-procedure baseline     Post-sedation assessments completed and reviewed: airway patency, cardiovascular function, hydration status, mental status, nausea/vomiting, pain level, respiratory function and temperature     Patient is stable for discharge or admission: yes     Patient tolerance:  Tolerated well, no immediate complications      Lennice Sites, DO 05/07/19 1632

## 2019-05-07 NOTE — H&P (Addendum)
History and Physical    Rebekah Bush [id number] DOB: 02-29-1930 DOA: 05/07/2019  Referring MD/NP/PA: Carmin Muskrat, MD PCP: Gavin Pound, MD  Patient coming from: Home   Chief Complaint: Right shoulder pain   I have personally briefly reviewed patient's old medical records in [place]   HPI: Rebekah Bush is a 86 y.o. female with medical history significant of hypertension, hypothyroidism, and gout.  Patient presents with complaints of right shoulder pain.  She presents with complaints of right shoulder pain and discomfort.  Patient lives alone and is normally able to complete all of her ADLs without assistance.  She was making her bed 2 days ago when she slipped on the wooden floor.  Reports that her cell phone was on the table charging and her life alert was on the dresser where she could not reach.  She has been at least 24 hours on the ground scooting around on her back prior to family coming in finding her.  Family called EMS, but after patient was able to get help right she felt somewhat better.  However, yesterday she felt more weakness and noted diffuse pain in her right arm that did not improved with over-the-counter medications.  She also reported some numbness and tingling in her fingers on the right hand.  Her daughter brought her in to be evaluated.  ED Course: Upon admission into the emergency department patient was noted to be afebrile, pulse 75-1 04, respirations 13-44, blood pressures 119/78-180 3/75, and O2 saturation maintained on room air.  Labs significant for WBC 18.4, hemoglobin 15.3, potassium 3, BUN 42, creatinine 0.93, and CK 2032.  X-rays revealed a right superior pubic ramus fracture and a right shoulder dislocation.  Influenza and COVID-19 screening were negative.  Dr. Erlinda Hong of orthopedics was consulted to evaluate the patient.  Patient was given 500 mL of normal saline IV fluids, fentanyl 25 mcg IV, and propofol was used to reduce right shoulder.  [facility] called  to admit.    Review of Systems  Constitutional: Negative for fever and weight loss.  HENT: Negative for congestion and ear discharge.   Eyes: Negative for photophobia and pain.  Respiratory: Negative for cough and shortness of breath.   Cardiovascular: Negative for chest pain and leg swelling.  Gastrointestinal: Negative for abdominal pain, nausea and vomiting.  Genitourinary: Negative for dysuria and hematuria.  Musculoskeletal: Positive for falls and joint pain.  Skin: Negative for rash.  Neurological: Positive for tingling. Negative for focal weakness and loss of consciousness.  Endo/Heme/Allergies: Negative for polydipsia.  Psychiatric/Behavioral: Negative for memory loss and substance abuse.    Past Medical History:  Diagnosis Date   Gout    Hypertension    Hyponatremia     Past Surgical History:  Procedure Laterality Date   CHOLECYSTECTOMY     RETINAL DETACHMENT SURGERY       reports that she has never smoked. She has never used smokeless tobacco. She reports current alcohol use. She reports that she does not use drugs.  Allergies  Allergen Reactions   Sulfa Antibiotics Shortness Of Breath, Nausea And Vomiting and Other (See Comments)    Headache    Bactrim [Sulfamethoxazole-Trimethoprim]     History reviewed. No pertinent family history.  Prior to Admission medications   Medication Sig Start Date End Date Taking? Authorizing Provider  Aspirin-Acetaminophen-Caffeine (GOODY HEADACHE PO) Take 1 Package by mouth every 6 (six) hours as needed (pain).    [provider]  furosemide (LASIX) 20  MG tablet Take 20 mg by mouth daily. 03/03/19   [provider]  hydrALAZINE (APRESOLINE) 50 MG tablet Take 50 mg by mouth 2 (two) times daily. 03/29/19   [provider]  levothyroxine (SYNTHROID, LEVOTHROID) 75 MCG tablet Take 75 mcg by mouth daily.    [provider]  metoprolol succinate (TOPROL-XL) 100 MG 24 hr tablet Take 100 mg  by mouth daily. Take with or immediately following a meal.    [provider]  quinapril (ACCUPRIL) 20 MG tablet Take 20 mg by mouth at bedtime.    [provider]  triamterene-hydrochlorothiazide (MAXZIDE-25) 37.5-25 MG per tablet Take 1 tablet by mouth daily.    [provider]    Physical Exam:  Constitutional: Elderly female who appears to be in NAD, calm, comfortable Vitals:   05/07/19 1155 05/07/19 1200 05/07/19 1205 05/07/19 1210  BP: 128/77 (!) 141/89 (!) 146/102 (!) 147/75  Pulse: (!) 101 86 100 89  Resp: (!) 27 20 15 18   Temp:      TempSrc:      SpO2: 100% 96% 100% 100%  Weight:      Height:       Eyes: PERRL, lids and conjunctivae normal ENMT: Mucous membranes are moist. Posterior pharynx clear of any exudate or lesions.  Neck: normal, supple, no masses, no thyromegaly Respiratory: clear to auscultation bilaterally, no wheezing, no crackles. Normal respiratory effort. No accessory muscle use.  Cardiovascular: Regular rate and rhythm, no murmurs / rubs / gallops. No extremity edema. 2+ pedal pulses. No carotid bruits.  Abdomen: no tenderness, no masses palpated. No hepatosplenomegaly. Bowel sounds positive.  Musculoskeletal: no clubbing / cyanosis.  Right arm in a sling.  Tenderness palpation of the right side of the pelvis. Skin: no rashes, lesions, ulcers. No induration Neurologic: CN 2-12 grossly intact. Sensation intact, DTR normal. Strength 5/5 in all 4.  Psychiatric: Normal judgment and insight. Alert and oriented x 3. Normal mood.     Labs on Admission: I have personally reviewed following labs and imaging studies  CBC: Recent Labs  Lab 05/07/19 0927  WBC 18.4*  NEUTROABS 16.2*  HGB 15.3*  HCT 46.2*  MCV 90.9  PLT [id number]   Basic Metabolic Panel: Recent Labs  Lab 05/07/19 0927  NA 139  K 3.0*  CL 102  CO2 26  GLUCOSE 128*  BUN 42*  CREATININE 0.93  CALCIUM 9.4   GFR: Estimated Creatinine Clearance: 30.6 mL/min (by C-G  formula based on SCr of 0.93 mg/dL). Liver Function Tests: Recent Labs  Lab 05/07/19 0927  AST 94*  ALT 40  ALKPHOS 67  BILITOT 1.5*  PROT 6.5  ALBUMIN 3.6   No results for input(s): LIPASE, AMYLASE in the last 168 hours. No results for input(s): AMMONIA in the last 168 hours. Coagulation Profile: No results for input(s): INR, PROTIME in the last 168 hours. Cardiac Enzymes: Recent Labs  Lab 05/07/19 0927  CKTOTAL 2,032*   BNP (last 3 results) No results for input(s): PROBNP in the last 8760 hours. HbA1C: No results for input(s): HGBA1C in the last 72 hours. CBG: No results for input(s): GLUCAP in the last 168 hours. Lipid Profile: No results for input(s): CHOL, HDL, LDLCALC, TRIG, CHOLHDL, LDLDIRECT in the last 72 hours. Thyroid Function Tests: No results for input(s): TSH, T4TOTAL, FREET4, T3FREE, THYROIDAB in the last 72 hours. Anemia Panel: No results for input(s): VITAMINB12, FOLATE, FERRITIN, TIBC, IRON, RETICCTPCT in the last 72 hours. Urine analysis:    Component  Value Date/Time   COLORURINE YELLOW 01/23/2013 1928   APPEARANCEUR CLEAR 01/23/2013 1928   LABSPEC 1.010 01/23/2013 1928   PHURINE 7.0 01/23/2013 1928   GLUCOSEU NEGATIVE 01/23/2013 1928   HGBUR NEGATIVE 01/23/2013 1928   BILIRUBINUR NEGATIVE 01/23/2013 1928   KETONESUR NEGATIVE 01/23/2013 1928   PROTEINUR NEGATIVE 01/23/2013 1928   UROBILINOGEN 1.0 01/23/2013 1928   NITRITE NEGATIVE 01/23/2013 1928   LEUKOCYTESUR NEGATIVE 01/23/2013 1928   Sepsis Labs: No results found for this or any previous visit (from the past 240 hour(s)).   Radiological Exams on Admission: DG Shoulder 1 View Right  Result Date: 05/07/2019 CLINICAL DATA:  86 year old who fell 2 days ago, complaining of RIGHT upper arm pain and RIGHT shoulder pain. Patient had an glenohumeral dislocation earlier today which has been reduced. Post reduction image. EXAM: RIGHT SHOULDER - 1 VIEW 12:34 p.m.: COMPARISON:  RIGHT shoulder x-rays  earlier same day 11:51 a.m. and 10:09 a.m. FINDINGS: Anatomic alignment of the glenohumeral joint post reduction. The lucency in the inferior glenoid questioned on the examination at 11:51 a.m. is inconspicuous on the current image. However, there is now evidence of mild acromioclavicular separation which was not present on the prior shoulder imaging earlier today. IMPRESSION: 1. Anatomic alignment of the glenohumeral joint post reduction. 2. The lucency in the inferior glenoid questioned on the examination earlier today is inconspicuous on the current image. 3. Mild acromioclavicular separation is suspected. Electronically Signed   By: Evangeline Dakin M.D.   On: 05/07/2019 13:28   DG Shoulder Right  Result Date: 05/07/2019 CLINICAL DATA:  Severe right shoulder pain and limited range of motion since a fall at home yesterday. EXAM: RIGHT SHOULDER - 2+ VIEW COMPARISON:  None FINDINGS: There is anterior dislocation of the right humeral head. No visible fracture. Aortic atherosclerosis. IMPRESSION: Anterior dislocation of the right humeral head. Aortic Atherosclerosis (ICD10-I70.0). Electronically Signed   By: Lorriane Shire M.D.   On: 05/07/2019 10:23   DG Shoulder Right Portable  Result Date: 05/07/2019 CLINICAL DATA:  Post reduction.  Initial encounter EXAM: PORTABLE RIGHT SHOULDER COMPARISON:  Same day. FINDINGS: Interval reduction of previously seen anterior right shoulder dislocation. There is a curvilinear lucency in the inferior aspect of the glenoid, of indeterminate age. Humeral head is high riding with near complete loss of the acromial humeral interval. Degenerative changes in the right acromioclavicular joint. Visualized portion of the right chest is unremarkable. IMPRESSION: 1. Interval reduction of previously seen right anterior shoulder dislocation. Curvilinear lucency within the inferior aspect of the glenoid is of indeterminate age. Difficult to exclude a fracture. 2. High-riding right humeral  head is indicative of chronic rotator cuff tear. 3. Right acromioclavicular joint osteoarthritis. Electronically Signed   By: Lorin Picket M.D.   On: 05/07/2019 12:23   DG Humerus Right  Result Date: 05/07/2019 CLINICAL DATA:  Right arm pain, fall EXAM: RIGHT HUMERUS - 2+ VIEW COMPARISON:  None. FINDINGS: Anterior and inferior shoulder dislocation is identified. No fracture identified. No radio-opaque foreign bodies. IMPRESSION: Anterior and inferior shoulder dislocation. Electronically Signed   By: Kerby Moors M.D.   On: 05/07/2019 09:40   DG Hip Unilat W or Wo Pelvis 1 View Right  Result Date: 05/07/2019 CLINICAL DATA:  Right hip and leg pain secondary to a fall at home yesterday. EXAM: DG HIP (WITH OR WITHOUT PELVIS) 1V RIGHT COMPARISON:  None. FINDINGS: There is a fracture of the right superior pubic ramus. Suspect there is a fracture of the right pubic body.  There is severe arthritis of right hip joint with medial joint space narrowing and marginal osteophyte formation on the femoral head. IMPRESSION: Fractures of the right superior pubic ramus and right pubic body. Severe arthritis of the right hip. Electronically Signed   By: Lorriane Shire M.D.   On: 05/07/2019 10:26   DG Femur Min 2 Views Right  Result Date: 05/07/2019 CLINICAL DATA:  Right hip and leg pain secondary to a fall at home yesterday. EXAM: RIGHT FEMUR 2 VIEWS COMPARISON:  None FINDINGS: There is no fracture or dislocation of right femur. Chondrocalcinosis at the right knee. Severe arthritis of the right hip joint. Note is made of a fracture of the right superior pubic ramus. IMPRESSION: No acute abnormality of the right femur. Fracture of the right superior pubic ramus. Electronically Signed   By: Lorriane Shire M.D.   On: 05/07/2019 10:29    EKG: Independently reviewed.  Sinus rhythm with QTC 541  Assessment/Plan Pelvic fracture and shoulder dislocation secondary to fall at home: Acute.  Patient presents after having a  fall at home 2 days ago.  She was found to have right superior pelvic ramus fracture and acutely dislocated right shoulder.  Patient was given propofol in the shoulder was put back in place in the emergency department. -Admit to a medical telemetry bed -Hydrocodone as needed for moderate pain  -PT/OT to evaluate and treat in a.m. -Transitions of care consult  -Appreciate orthopedic consultative services,  will follow-up for further recommendations  Rhabdomyolysis: Acute.  On admission CK noted to be elevated at 2032.  Patient had been on the floor for over 24 hours as the likely cause of symptoms.  Urinalysis had not been obtained. -Normal saline IV fluids at 125 mL/h as tolerated -Follow-up urinalysis -Recheck CK in a.m.  Leukocytosis: WBC elevated at 18.4.  Suspect this is reactive in nature to the acute fracture. -Continue to monitor  Prolonged QT interval: Acute.  QTc noted to be 541. -Correct electrolyte abnormalities -Hold QT prolonging medication -Recheck EKG in a.m.  Hypokalemia: Acute.  Initial potassium noted to be 3. -Give 40 mEq of potassium chloride x1 dose. -Continue to monitor and replace as needed  Hypothyroidism -Check TSH -Continue levothyroxine  DVT prophylaxis: Lovenox Code Status: Full Family Communication: Discussed plan of care over the phone with the patient's daughter Disposition Plan: Likely discharge home once medically stable Consults called: Orthopedics Admission status: Inpatient  Norval Morton MD [facility] Pager [phone]   If 7PM-7AM, please contact night-coverage [email or web address] Password [facility]  05/07/2019, 2:06 PM

## 2019-05-07 NOTE — ED Notes (Signed)
ED Provider at bedside. 

## 2019-05-07 NOTE — Progress Notes (Signed)
I have reviewed xrays of shoulder and pelvis.  She can be WBAT BLE.  NWB RUE.  Sling at all times.  Follow up in office in 2 weeks.

## 2019-05-07 NOTE — ED Provider Notes (Addendum)
[facility] DEPT Provider Note   CSN: [id number] Arrival date & time: 05/07/19  [id number]     History Chief Complaint  Patient presents with  . Fall  . Shoulder Pain    Rebekah Bush is a 86 y.o. female.  HPI     Patient presents 2 days after a fall, now with concern for generalized weakness pain in her arm.  She notes that she was in her usual state of health when she slipped on a wooden floor. She does have a life alert bracelet, but this was on top of the dresser.  She was unable to get up for 1 day, but notes that yesterday, after getting help, getting upright, she felt somewhat better.  On however, over the interval subsequent days she has had increasing generalized weakness, soreness, and focal pain in her right arm, diffusely, but primarily in the shoulder. Pain is improved with rest, OTC medication. She denies head trauma, loss of consciousness, focal weakness in any extremity. She states that she is generally well, and has been doing well recently.  Past Medical History:  Diagnosis Date  . Gout   . Hypertension   . Hyponatremia     There are no problems to display for this patient.   Past Surgical History:  Procedure Laterality Date  . CHOLECYSTECTOMY    . RETINAL DETACHMENT SURGERY       OB History   No obstetric history on file.     History reviewed. No pertinent family history.  Social History   Tobacco Use  . Smoking status: Never Smoker  . Smokeless tobacco: Never Used  Substance Use Topics  . Alcohol use: Yes  . Drug use: Never    Home Medications Prior to Admission medications   Medication Sig Start Date End Date Taking? Authorizing Provider  Aspirin-Acetaminophen-Caffeine (GOODY HEADACHE PO) Take 1 Package by mouth every 6 (six) hours as needed (pain).    [provider]  furosemide (LASIX) 20 MG tablet Take 20 mg by mouth daily. 03/03/19   [provider]  hydrALAZINE (APRESOLINE) 50 MG  tablet Take 50 mg by mouth 2 (two) times daily. 03/29/19   [provider]  levothyroxine (SYNTHROID, LEVOTHROID) 75 MCG tablet Take 75 mcg by mouth daily.    [provider]  metoprolol succinate (TOPROL-XL) 100 MG 24 hr tablet Take 100 mg by mouth daily. Take with or immediately following a meal.    [provider]  quinapril (ACCUPRIL) 20 MG tablet Take 20 mg by mouth at bedtime.    [provider]  triamterene-hydrochlorothiazide (MAXZIDE-25) 37.5-25 MG per tablet Take 1 tablet by mouth daily.    [provider]    Allergies    Sulfa antibiotics and Bactrim [sulfamethoxazole-trimethoprim]  Review of Systems   Review of Systems  Constitutional:       Per HPI, otherwise negative  HENT:       Per HPI, otherwise negative  Respiratory:       Per HPI, otherwise negative  Cardiovascular:       Per HPI, otherwise negative  Gastrointestinal: Negative for vomiting.  Endocrine:       Negative aside from HPI  Genitourinary:       Neg aside from HPI   Musculoskeletal:       Per HPI, otherwise negative  Skin: Positive for color change.  Neurological: Positive for weakness. Negative for syncope.    Physical Exam Updated Vital Signs BP (!) 147/75  Pulse 89   Temp 97.9 F (36.6 C) (Oral)   Resp 18   Ht 5' (1.524 m)   Wt 53.1 kg   SpO2 100%   BMI 22.85 kg/m   Physical Exam Vitals and nursing note reviewed.  Constitutional:      Appearance: She is well-developed. She is ill-appearing.     Comments: Thin elderly F awake and alert, speaking clearly but uncomfortable.  HENT:     Head: Normocephalic and atraumatic.  Eyes:     Conjunctiva/sclera: Conjunctivae normal.  Cardiovascular:     Rate and Rhythm: Normal rate and regular rhythm.  Pulmonary:     Effort: Pulmonary effort is normal. No respiratory distress.     Breath sounds: Normal breath sounds. No stridor.  Abdominal:     General: There is no distension.     Tenderness:  There is no abdominal tenderness.  Musculoskeletal:     Right shoulder: Deformity, tenderness and bony tenderness present.     Left shoulder: Normal.     Left upper arm: Normal.     Right elbow: No deformity or effusion. Normal range of motion. Tenderness present.     Right wrist: Tenderness and bony tenderness present. No swelling or deformity. Normal pulse.     Left wrist: Normal.       Arms:       Legs:  Skin:    General: Skin is warm and dry.  Neurological:     Mental Status: She is alert and oriented to person, place, and time.     Cranial Nerves: No cranial nerve deficit.     ED Results / Procedures / Treatments   Labs (all labs ordered are listed, but only abnormal results are displayed) Labs Reviewed  COMPREHENSIVE METABOLIC PANEL - Abnormal; Notable for the following components:      Result Value   Potassium 3.0 (*)    Glucose, Bld 128 (*)    BUN 42 (*)    AST 94 (*)    Total Bilirubin 1.5 (*)    GFR calc non Af Amer 55 (*)    All other components within normal limits  CBC WITH DIFFERENTIAL/PLATELET - Abnormal; Notable for the following components:   WBC 18.4 (*)    Hemoglobin 15.3 (*)    HCT 46.2 (*)    Neutro Abs 16.2 (*)    Lymphs Abs 0.6 (*)    Monocytes Absolute 1.5 (*)    Abs Immature Granulocytes 0.11 (*)    All other components within normal limits  CK - Abnormal; Notable for the following components:   Total CK 2,032 (*)    All other components within normal limits  RESPIRATORY PANEL BY RT PCR (FLU A&B, COVID)  URINALYSIS, ROUTINE W REFLEX MICROSCOPIC    EKG EKG Interpretation  Date/Time:  Monday May 07 2019 08:50:24 EST Ventricular Rate:  88 PR Interval:    QRS Duration: 93 QT Interval:  447 QTC Calculation: 541 R Axis:   80 Text Interpretation: Sinus rhythm Early repolarization pattern No significant change since last tracing Abnormal ECG Confirmed by Carmin Muskrat [phone]) on 05/07/2019 9:46:09 AM   Radiology DG Shoulder 1 View  Right  Result Date: 05/07/2019 CLINICAL DATA:  86 year old who fell 2 days ago, complaining of RIGHT upper arm pain and RIGHT shoulder pain. Patient had an glenohumeral dislocation earlier today which has been reduced. Post reduction image. EXAM: RIGHT SHOULDER - 1 VIEW 12:34 p.m.: COMPARISON:  RIGHT shoulder x-rays earlier same day 11:51 a.m.  and 10:09 a.m. FINDINGS: Anatomic alignment of the glenohumeral joint post reduction. The lucency in the inferior glenoid questioned on the examination at 11:51 a.m. is inconspicuous on the current image. However, there is now evidence of mild acromioclavicular separation which was not present on the prior shoulder imaging earlier today. IMPRESSION: 1. Anatomic alignment of the glenohumeral joint post reduction. 2. The lucency in the inferior glenoid questioned on the examination earlier today is inconspicuous on the current image. 3. Mild acromioclavicular separation is suspected. Electronically Signed   By: Evangeline Dakin M.D.   On: 05/07/2019 13:28   DG Shoulder Right  Result Date: 05/07/2019 CLINICAL DATA:  Severe right shoulder pain and limited range of motion since a fall at home yesterday. EXAM: RIGHT SHOULDER - 2+ VIEW COMPARISON:  None FINDINGS: There is anterior dislocation of the right humeral head. No visible fracture. Aortic atherosclerosis. IMPRESSION: Anterior dislocation of the right humeral head. Aortic Atherosclerosis (ICD10-I70.0). Electronically Signed   By: Lorriane Shire M.D.   On: 05/07/2019 10:23   DG Shoulder Right Portable  Result Date: 05/07/2019 CLINICAL DATA:  Post reduction.  Initial encounter EXAM: PORTABLE RIGHT SHOULDER COMPARISON:  Same day. FINDINGS: Interval reduction of previously seen anterior right shoulder dislocation. There is a curvilinear lucency in the inferior aspect of the glenoid, of indeterminate age. Humeral head is high riding with near complete loss of the acromial humeral interval. Degenerative changes in the  right acromioclavicular joint. Visualized portion of the right chest is unremarkable. IMPRESSION: 1. Interval reduction of previously seen right anterior shoulder dislocation. Curvilinear lucency within the inferior aspect of the glenoid is of indeterminate age. Difficult to exclude a fracture. 2. High-riding right humeral head is indicative of chronic rotator cuff tear. 3. Right acromioclavicular joint osteoarthritis. Electronically Signed   By: Lorin Picket M.D.   On: 05/07/2019 12:23   DG Humerus Right  Result Date: 05/07/2019 CLINICAL DATA:  Right arm pain, fall EXAM: RIGHT HUMERUS - 2+ VIEW COMPARISON:  None. FINDINGS: Anterior and inferior shoulder dislocation is identified. No fracture identified. No radio-opaque foreign bodies. IMPRESSION: Anterior and inferior shoulder dislocation. Electronically Signed   By: Kerby Moors M.D.   On: 05/07/2019 09:40   DG Hip Unilat W or Wo Pelvis 1 View Right  Result Date: 05/07/2019 CLINICAL DATA:  Right hip and leg pain secondary to a fall at home yesterday. EXAM: DG HIP (WITH OR WITHOUT PELVIS) 1V RIGHT COMPARISON:  None. FINDINGS: There is a fracture of the right superior pubic ramus. Suspect there is a fracture of the right pubic body. There is severe arthritis of right hip joint with medial joint space narrowing and marginal osteophyte formation on the femoral head. IMPRESSION: Fractures of the right superior pubic ramus and right pubic body. Severe arthritis of the right hip. Electronically Signed   By: Lorriane Shire M.D.   On: 05/07/2019 10:26   DG Femur Min 2 Views Right  Result Date: 05/07/2019 CLINICAL DATA:  Right hip and leg pain secondary to a fall at home yesterday. EXAM: RIGHT FEMUR 2 VIEWS COMPARISON:  None FINDINGS: There is no fracture or dislocation of right femur. Chondrocalcinosis at the right knee. Severe arthritis of the right hip joint. Note is made of a fracture of the right superior pubic ramus. IMPRESSION: No acute abnormality  of the right femur. Fracture of the right superior pubic ramus. Electronically Signed   By: Lorriane Shire M.D.   On: 05/07/2019 10:29    Procedures .Ortho Injury Treatment  Date/Time: 05/07/2019 11:56 AM Performed by: Carmin Muskrat, MD Authorized by: Carmin Muskrat, MD   Consent:    Consent obtained:  Written   Consent given by:  Patient   Risks discussed:  Irreducible dislocation   Alternatives discussed:  Alternative treatment and no treatment Universal protocol:    Procedure explained and questions answered to patient or proxy's satisfaction: yes     Relevant documents present and verified: yes     Test results available and properly labeled: yes     Imaging studies available: yes     Required blood products, implants, devices, and special equipment available: yes     Site/side marked: yes     Immediately prior to procedure a time out was called: yes     Patient identity confirmed:  Verbally with patientInjury location: shoulder Location details: right shoulder Injury type: dislocation Dislocation type: inferior Hill-Sachs deformity: no Chronicity: new Pre-procedure neurovascular assessment: neurovascularly intact Pre-procedure distal perfusion: normal Pre-procedure neurological function: normal Pre-procedure range of motion: reduced  Anesthesia: Local anesthesia used: no  Patient sedated: Yes. Refer to sedation procedure documentation for details of sedation. Manipulation performed: yes Reduction method: external rotation and traction and counter traction Reduction successful: yes X-ray confirmed reduction: yes Immobilization: sling Supplies used: cotton padding Post-procedure neurovascular assessment: post-procedure neurovascularly intact Post-procedure neurological function: normal Post-procedure range of motion: improved Patient tolerance: patient tolerated the procedure well with no immediate complications     CRITICAL CARE Performed by: Carmin Muskrat Total critical care time: 35 minutes Critical care time was exclusive of separately billable procedures and treating other patients. Critical care was necessary to treat or prevent imminent or life-threatening deterioration. Critical care was time spent personally by me on the following activities: development of treatment plan with patient and/or surrogate as well as nursing, discussions with consultants, evaluation of patient's response to treatment, examination of patient, obtaining history from patient or surrogate, ordering and performing treatments and interventions, ordering and review of laboratory studies, ordering and review of radiographic studies, pulse oximetry and re-evaluation of patient's condition.    Medications Ordered in ED Medications  0.9 %  sodium chloride infusion ( Intravenous New Bag/Given 05/07/19 1217)  sodium chloride 0.9 % bolus 500 mL (0 mLs Intravenous Stopped 05/07/19 1217)  fentaNYL (SUBLIMAZE) injection 25 mcg (25 mcg Intravenous Given 05/07/19 0951)  propofol (DIPRIVAN) 10 mg/mL bolus/IV push 50 mg (50 mg Intravenous Given 05/07/19 1143)  propofol (DIPRIVAN) 10 mg/mL bolus/IV push (50 mg Intravenous Given 05/07/19 1143)    ED Course  I have reviewed the triage vital signs and the nursing notes.  Pertinent labs & imaging results that were available during my care of the patient were reviewed by me and considered in my medical decision making (see chart for details).   Sinus rhythm 95 unremarkable on monitor. MDM Rules/Calculators/A&P                      After the initial evaluation reduction of the patient's shoulder discussed her case with her orthopedic colleague, Dr. Erlinda Hong.  Ortho will follow as a consulting team as needed. Patient continues to receive fluid resuscitation.  Patient now has continued fluid resuscitation, almost 1.5 L complete.  1:48 PM Patient in no distress, now awake and following conscious sedation. On cardiac monitor shows  sinus rhythm, rate 90. She is aware of all findings, including pelvic fracture, dislocation with reduction of her shoulder, and labs concerning for rhabdomyolysis, though without renal implication thus far. She  has already received 1.5 L of fluid resuscitation, analgesia, is on cardiac monitor, but given after mentioned concerns, after conversation with orthopedics will require admission for further monitoring, management. Final Clinical Impression(s) / ED Diagnoses Final diagnoses:  Fall, initial encounter  Multiple closed fractures of pelvis without disruption of pelvic ring, initial encounter [facility])  Shoulder dislocation, right, initial encounter  Non-traumatic rhabdomyolysis     Carmin Muskrat, MD 05/07/19 1351    Carmin Muskrat, MD 05/07/19 1428

## 2019-05-07 NOTE — ED Notes (Signed)
Shoulder immobilizer in place

## 2019-05-07 NOTE — ED Notes (Signed)
Ellen/daughter  [phone]

## 2019-05-08 DIAGNOSIS — S43004A Unspecified dislocation of right shoulder joint, initial encounter: Secondary | ICD-10-CM

## 2019-05-08 DIAGNOSIS — M2550 Pain in unspecified joint: Secondary | ICD-10-CM | POA: Diagnosis not present

## 2019-05-08 DIAGNOSIS — S32519A Fracture of superior rim of unspecified pubis, initial encounter for closed fracture: Secondary | ICD-10-CM | POA: Diagnosis not present

## 2019-05-08 DIAGNOSIS — I10 Essential (primary) hypertension: Secondary | ICD-10-CM | POA: Diagnosis not present

## 2019-05-08 DIAGNOSIS — Z7401 Bed confinement status: Secondary | ICD-10-CM | POA: Diagnosis not present

## 2019-05-08 DIAGNOSIS — S43014A Anterior dislocation of right humerus, initial encounter: Secondary | ICD-10-CM | POA: Diagnosis not present

## 2019-05-08 DIAGNOSIS — I469 Cardiac arrest, cause unspecified: Secondary | ICD-10-CM | POA: Diagnosis not present

## 2019-05-08 DIAGNOSIS — R52 Pain, unspecified: Secondary | ICD-10-CM | POA: Diagnosis not present

## 2019-05-08 HISTORY — DX: Unspecified dislocation of right shoulder joint, initial encounter: S43.004A

## 2019-05-08 LAB — CBC
HCT: 41.1 % (ref 36.0–46.0)
Hemoglobin: 13.5 g/dL (ref 12.0–15.0)
MCH: 30.5 pg (ref 26.0–34.0)
MCHC: 32.8 g/dL (ref 30.0–36.0)
MCV: 92.8 fL (ref 80.0–100.0)
Platelets: 199 10*3/uL (ref 150–400)
RBC: 4.43 MIL/uL (ref 3.87–5.11)
RDW: 14.6 % (ref 11.5–15.5)
WBC: 13.6 10*3/uL — ABNORMAL HIGH (ref 4.0–10.5)
nRBC: 0 % (ref 0.0–0.2)

## 2019-05-08 LAB — BASIC METABOLIC PANEL
Anion gap: 7 (ref 5–15)
BUN: 27 mg/dL — ABNORMAL HIGH (ref 8–23)
CO2: 24 mmol/L (ref 22–32)
Calcium: 8.6 mg/dL — ABNORMAL LOW (ref 8.9–10.3)
Chloride: 108 mmol/L (ref 98–111)
Creatinine, Ser: 0.66 mg/dL (ref 0.44–1.00)
GFR calc Af Amer: >60 mL/min (ref >60)
GFR calc non Af Amer: >60 mL/min (ref >60)
Glucose, Bld: 100 mg/dL — ABNORMAL HIGH (ref 70–99)
Potassium: 3.7 mmol/L (ref 3.5–5.1)
Sodium: 139 mmol/L (ref 135–145)

## 2019-05-08 LAB — MAGNESIUM: Magnesium: 2.1 mg/dL (ref 1.7–2.4)

## 2019-05-08 LAB — CK: Total CK: 771 U/L — ABNORMAL HIGH (ref 38–234)

## 2019-05-08 MED ORDER — HYDROCODONE-ACETAMINOPHEN 5-325 MG PO TABS: 1.0000 | ORAL_TABLET | Freq: Four times a day (QID) | ORAL | 0 refills | Status: DC | PRN

## 2019-05-08 NOTE — Care Management CC44 (Signed)
Condition Code 44 Documentation Completed  Patient Details  Name: SEANA STELMA MRN: [id number] Date of Birth: December 21, 1929   Condition Code 44 given:  Yes Patient signature on Condition Code 44 notice:  Yes Documentation of 2 MD's agreement:  Yes Code 44 added to claim:  Yes    Erenest Rasher, RN 05/08/2019, 2:36 PM

## 2019-05-08 NOTE — Discharge Instructions (Signed)
Shoulder Dislocation Your shoulder joint is made up of 3 bones:  The upper arm bone (humerus).  The shoulder blade (scapula).  The collarbone (clavicle). A shoulder dislocation happens when your upper arm bone moves out of its normal place in your shoulder joint. What are the causes? This condition is often caused by:  A fall.  A hard, direct hit to the shoulder.  A forceful movement of the shoulder. What increases the risk? You are more likely to develop this condition if you play sports. What are the signs or symptoms?   Bad shape (deformity) of the shoulder.  Very bad pain.  A shoulder that you cannot move.  Numbness, weakness, or tingling in your neck or down your arm.  Bruising or swelling around your shoulder. How is this treated? This condition is treated with a procedure called a reduction. This is done to place the upper arm bone back in the joint. There are two types of reduction:  Closed reduction. The upper arm bone is placed back in the joint without surgery. The doctor uses his or her hands to guide the bone back into place.  Open reduction. Surgery is done to place the upper arm bone back in the joint. This may be needed if: ? You have a weak shoulder joint or weak tissues that connect bones to each other (ligaments). ? You have had more than one shoulder dislocation. ? The nerves or blood vessels around your shoulder have been damaged. After the procedure, you will wear a brace or sling to prevent the arm from moving. After the brace or sling is removed, you will have physical therapy to help improve movement (range of motion) in your shoulder joint. Follow these instructions at home: Medicines  Take over-the-counter and prescription medicines only as told by your doctor.  Ask your doctor if the medicine prescribed to you: ? Requires you to avoid driving or using heavy machinery. ? Can cause trouble pooping (constipation). You may need to take steps to  prevent or treat trouble pooping:  Drink enough fluid to keep your pee (urine) pale yellow.  Take over-the-counter or prescription medicines.  Eat foods that are high in fiber. These include beans, whole grains, and fresh fruits and vegetables.  Limit foods that are high in fat and sugar. These include fried or sweet foods. If you have a brace or sling:  Wear the brace or sling as told by your doctor. Remove it only as told by your doctor.  Loosen the brace or sling if your fingers: ? Tingle. ? Become numb. ? Turn cold or blue.  Keep the brace or sling clean.  If the brace or sling is not waterproof: ? Do not let it get wet. ? Cover it with a watertight covering when you take a bath or shower. Managing pain, stiffness, and swelling   If told, put ice on the injured area. ? If you can remove your brace or sling, remove it as told by your doctor. ? Put ice in a plastic bag. ? Place a towel between your skin and the bag. ? Leave the ice on for 20 minutes, 2-3 times per day.  Move your fingers often.  Raise (elevate) the injured area above the level of your heart while you are sitting or lying down. Activity  Do not lift your arm above shoulder level until your doctor approves.  Do not lift anything until your doctor says that it is safe.  Do not push or pull   things until your doctor approves.  Return to your normal activities as told by your doctor. Ask your doctor what activities are safe for you.  Do range-of-motion exercises only as told by your doctor.  Exercise your hand by squeezing a soft ball. This keeps your hand and wrist from getting stiff and swollen. General instructions  Do not drive while you are wearing a brace or sling on a hand that you use for driving.  Do not take baths, swim, or use a hot tub until your doctor approves. Ask your doctor if you may take showers. You may only be allowed to take sponge baths.  Do not use any tobacco products,  including cigarettes, chewing tobacco, or e-cigarettes. These can delay healing. If you need help quitting, ask your doctor.  Keep all follow-up visits as told by your doctor. This is important. Contact a doctor if:  Your brace or sling gets damaged. Get help right away if:  Your pain gets worse, not better.  You lose feeling in your arm or hand.  Your arm or hand turns white and cold. Summary  A shoulder dislocation happens when your upper arm bone moves out of its normal place in your shoulder joint.  It is often caused by a fall, a strong hit to the shoulder, or a forceful movement of the shoulder.  It causes very bad pain. You may not be able to move your shoulder.  This condition is treated with either closed or open reduction. You will also be given a brace or sling. You will do exercises to improve movement in your shoulder joint.  Contact a doctor if your brace or sling gets damaged. Get help right away if your pain gets worse, you lose feeling in your arm or hand, or your arm or hand turns white or cold. This information is not intended to replace advice given to you by your health care provider. Make sure you discuss any questions you have with your health care provider. Document Revised: 11/02/2017 Document Reviewed: 11/02/2017 Elsevier Patient Education  2020 [employer]  

## 2019-05-08 NOTE — Progress Notes (Signed)
PT wheelchair Note  Patient Details Name: Rebekah Bush MRN: [id number] DOB: 01-Jul-1929   Patient suffers from fractured pelvis and dislocated R shoulder with NWB status on shoulder which impairs their ability to perform daily activities like walking to restroom in the home.  A walker alone will not resolve the issues with performing activities of daily living. A wheelchair will allow patient to safely perform daily activities.  The patient can self propel in the home or has a caregiver who can provide assistance.     Maggie Font, PT Acute Rehab Services Pager [phone] [facility] [phone]        Karlton Lemon 05/08/2019, 2:23 PM

## 2019-05-08 NOTE — Evaluation (Addendum)
Physical Therapy Evaluation Patient Details Name: Rebekah Bush MRN: [id number] DOB: 1929/08/17 Today's Date: 05/08/2019   History of Present Illness  Pt is 86 y.o. female with medical history significant of hypertension, hypothyroidism, and gout.  She fell 2 days prior to admission and found to have R superior pelvic ramus fracture and dislocated R shoulder.  Shoulder was reduced in the ED.  Per ortho WBAT Bil LE and NWB R UE in sling at all times. Pt additionally with rhabdomylolysis.  Clinical Impression   Pt admitted with above diagnosis. Pt requiring mod A for transfers and to ambulate 5' with L UE support.  Pt needing cues for gait and transfer techniques.  Pt will need SNF level of care but reports she wants to hire private aide to assist at her daughter's house rather than going to SNF due to [place]. She would additionally benefit from w/c due to pelvis fracture and R UE fracture limiting her ability to use a RW.  Pt currently with functional limitations due to the deficits listed below (see PT Problem List). Pt will benefit from skilled PT to increase their independence and safety with mobility to allow discharge to the venue listed below.       Follow Up Recommendations Supervision/Assistance - 24 hour;Home health PT(Pt wants to hire aide for 24 hr assist instead of going to SNF - needs SNF level of care);  Pt will need transportation home- unable to get in car or up steps    Equipment Recommendations  3in1 (PT);Wheelchair (measurements PT)   Patient suffers from fractured pelvis and dislocated R shoulder with NWB status on shoulder which impairs their ability to perform daily activities like walking to restroom in the home.  A walker alone will not resolve the issues with performing activities of daily living. A wheelchair will allow patient to safely perform daily activities.  The patient can self propel in the home or has a caregiver who can provide assistance.       Recommendations  for Other Services       Precautions / Restrictions Precautions Precautions: Fall Required Braces or Orthoses: Sling(R UE in sling at all times) Restrictions Weight Bearing Restrictions: Yes RUE Weight Bearing: Non weight bearing RLE Weight Bearing: Weight bearing as tolerated LLE Weight Bearing: Weight bearing as tolerated      Mobility  Bed Mobility Overal bed mobility: Needs Assistance Bed Mobility: Supine to Sit;Sit to Supine     Supine to sit: Mod assist;HOB elevated Sit to supine: Mod assist;HOB elevated   General bed mobility comments: increased time, cues for sequence, assist for bil LE and to elevate trunk  Transfers Overall transfer level: Needs assistance Equipment used: Rolling walker (2 wheeled)((used RW perpendicular to L side)) Transfers: Sit to/from Stand Sit to Stand: Min assist         General transfer comment: increased time, cues for safe hand placment  Ambulation/Gait Ambulation/Gait assistance: Mod assist Gait Distance (Feet): 4 Feet Assistive device: Rolling walker (2 wheeled)((used RW perpendicular to L side)) Gait Pattern/deviations: Decreased stride length;Antalgic Gait velocity: decreased   General Gait Details: reports pain with R swing phase and weight bearing;  used RW on L side and therapist moved as needed; pt able to take a few small steps but limited by pain  Stairs            Wheelchair Mobility    Modified Rankin (Stroke Patients Only)       Balance Overall balance assessment: Needs assistance Sitting-balance  support: Single extremity supported;Feet supported Sitting balance-Leahy Scale: Fair Sitting balance - Comments: Sat EOB for 10 mins with SBA   Standing balance support: Single extremity supported;During functional activity Standing balance-Leahy Scale: Poor                               Pertinent Vitals/Pain Pain Assessment: Faces Faces Pain Scale: Hurts even more Pain Location: R pelvis -  with transfers; no pain at rest, reports felt good to be up for a bit Pain Descriptors / Indicators: Guarding;Grimacing Pain Intervention(s): Limited activity within patient's tolerance;Monitored during session;Repositioned    Home Living Family/patient expects to be discharged to:: Private residence Living Arrangements: Alone Available Help at Discharge: Family;Available PRN/intermittently;Other (Comment)(Pt would like to go home with daughter: son in law works from home but pt also Magazine features editor to assist) Type of Home: House(information for daughters house) Home Access: Stairs to enter Entrance Stairs-Rails: None Entrance Stairs-Number of Steps: 4 Home Layout: Two level;Able to live on main level with bedroom/bathroom Home Equipment: Kasandra Knudsen - single point      Prior Function Level of Independence: Independent         Comments: prior to fall was independent; only difficulty was getting into tub and felt unsteady going up a curb ; states "no strength in upper thighs"     Hand Dominance        Extremity/Trunk Assessment   Upper Extremity Assessment Upper Extremity Assessment: LUE deficits/detail;RUE deficits/detail RUE Deficits / Details: Hand and Wrist WNL; Shoulder and elbow not tested - in sling LUE Deficits / Details: ROM: hand/wrist/elbow WNL, shoulder ~100 degrees PROM elevation; MMT: hand/wrist 4+/5, elbow 4/5, shoulder 1/5(reports chronic L shoulder weakness)    Lower Extremity Assessment Lower Extremity Assessment: LLE deficits/detail;RLE deficits/detail RLE Deficits / Details: ROM WFL; MMT hip 2/5, knee 4/5, ankle 5/5 LLE Deficits / Details: ROM WFL; MMT hip 2/5, knee 5/5, ankle 5/5       Communication   Communication: No difficulties  Cognition Arousal/Alertness: Awake/alert Behavior During Therapy: WFL for tasks assessed/performed Overall Cognitive Status: Within Functional Limits for tasks assessed                                         General Comments General comments (skin integrity, edema, etc.): vss    Exercises     Assessment/Plan    PT Assessment Patient needs continued PT services  PT Problem List Decreased strength;Decreased mobility;Decreased knowledge of precautions;Decreased activity tolerance;Decreased balance;Decreased knowledge of use of DME;Pain       PT Treatment Interventions DME instruction;Therapeutic exercise;Gait training;Wheelchair mobility training;Balance training;Stair training;Functional mobility training;Therapeutic activities;Patient/family education    PT Goals (Current goals can be found in the Care Plan section)  Acute Rehab PT Goals Patient Stated Goal: home with daughter and hiring and aide to assist PT Goal Formulation: With patient Time For Goal Achievement: 05/22/19 Potential to Achieve Goals: Good    Frequency Min 3X/week   Barriers to discharge Decreased caregiver support Pt reports wanting to hire aide in order to have 24 hr assist as opposed to going to SNF    Co-evaluation               AM-PAC PT "6 Clicks" Mobility  Outcome Measure Help needed turning from your back to your side while in a flat bed without using  bedrails?: A Lot Help needed moving from lying on your back to sitting on the side of a flat bed without using bedrails?: A Lot Help needed moving to and from a bed to a chair (including a wheelchair)?: A Lot Help needed standing up from a chair using your arms (e.g., wheelchair or bedside chair)?: A Little Help needed to walk in hospital room?: A Lot Help needed climbing 3-5 steps with a railing? : Total 6 Click Score: 12    End of Session Equipment Utilized During Treatment: Gait belt Activity Tolerance: Patient limited by pain Patient left: in bed;with call bell/phone within reach Nurse Communication: Mobility status PT Visit Diagnosis: Unsteadiness on feet (R26.81);Other abnormalities of gait and mobility (R26.89);Muscle weakness  (generalized) (M62.81);History of falling (Z91.81)    Time: [id number] PT Time Calculation (min) (ACUTE ONLY): 40 min   Charges:   PT Evaluation $PT Eval Moderate Complexity: 1 Mod PT Treatments $Gait Training: 8-22 mins        Maggie Font, PT Acute Rehab Services Pager [phone] [facility] [phone]   Karlton Lemon 05/08/2019, 1:19 PM

## 2019-05-08 NOTE — TOC Initial Note (Signed)
Transition of Care [facility]) - Initial/Assessment Note    Patient Details  Name: Rebekah Bush MRN: [id number] Date of Birth: 20-Feb-1930  Transition of Care [facility]) CM/SW Contact:    Rebekah Rasher, RN Phone Number: [phone] 05/08/2019, 2:29 PM  Clinical Narrative:                  Rebekah Bush to dtr, Rebekah Bush and pt will be going to her address on [address] Rebekah Bush [address]. Offered choice for [facility], dtr agreeable to [facility]. Contacted [employer] and they will deliver DME to dtr's home. Pt transport to home via [facility].    Expected Discharge Plan: [place] Barriers to Discharge: No Barriers Identified   Patient Goals and CMS Choice Patient states their goals for this hospitalization and ongoing recovery are:: wants to have her come home with caregivers, they will hire CMS Medicare.gov Compare Post Acute Care list provided to:: Patient Represenative (must comment)(daughter, Rebekah Bush) Choice offered to / list presented to : Adult Children  Expected Discharge Plan and Services Expected Discharge Plan: [place] In-house Referral: Clinical Social Work Discharge Planning Services: CM Consult Post Acute Care Choice: [place] arrangements for the past 2 months: [place] Expected Discharge Date: 05/08/19               DME Arranged: 3-N-1, Wheelchair manual DME Agency: [employer] Date DME Agency Contacted: 05/08/19 Time DME Agency Contacted: [phone] Representative spoke with at DME Agency: Rebekah Bush HH Arranged: RN, PT, OT, Nurse's Aide Rebekah Bush Agency: [facility] (formerly [employer]) Date [place]: 05/08/19 Time [place]: 47 Representative spoke with at [place]: Rebekah Bush  Prior Living Arrangements/Services Living arrangements for the past 2 months: [place] with:: Adult Children Patient language and need for interpreter reviewed:: Yes Do you feel safe  going back to the place where you live?: Yes      Need for Family Participation in Patient Care: Yes (Comment) Care giver support system in place?: Yes (comment)   Criminal Activity/Legal Involvement Pertinent to Current Situation/Hospitalization: No - Comment as needed  Activities of Daily Living Home Assistive Devices/Equipment: Eyeglasses, Cane (specify quad or straight), Other (Comment)(single point cane, sling for right arm) ADL Screening (condition at time of admission) Patient's cognitive ability adequate to safely complete daily activities?: Yes Is the patient deaf or have difficulty hearing?: Yes(slight hoh) Does the patient have difficulty seeing, even when wearing glasses/contacts?: No Does the patient have difficulty concentrating, remembering, or making decisions?: No Patient able to express need for assistance with ADLs?: Yes Does the patient have difficulty dressing or bathing?: Yes Independently performs ADLs?: No Communication: Independent Dressing (OT): Needs assistance Is this a change from baseline?: Change from baseline, expected to last >3 days Grooming: Needs assistance Is this a change from baseline?: Change from baseline, expected to last >3 days Feeding: Needs assistance Is this a change from baseline?: Change from baseline, expected to last >3 days Bathing: Needs assistance Is this a change from baseline?: Change from baseline, expected to last >3 days Toileting: Needs assistance Is this a change from baseline?: Change from baseline, expected to last >3days In/Out Bed: Needs assistance Is this a change from baseline?: Change from baseline, expected to last >3 days Walks in Home: Needs assistance Is this a change from baseline?: Change from baseline, expected to last >3 days Does the patient have difficulty walking or  climbing stairs?: Yes(secondary to weakness and right arm injury) Weakness of Legs: Both Weakness of Arms/Hands: Right  Permission  Sought/Granted Permission sought to share information with : Case Manager Permission granted to share information with : Yes, Verbal Permission Granted  Share Information with NAME: Rebekah Bush  Permission granted to share info w AGENCY: Home Health, DME  Permission granted to share info w Relationship: daughter  Permission granted to share info w Contact Information: [id number]  Emotional Assessment       Orientation: : Oriented to Self, Oriented to  Time, Oriented to Place, Oriented to Situation   Psych Involvement: No (comment)  Admission diagnosis:  Rhabdomyolysis [M62.82] Shoulder dislocation, right, initial encounter [S43.004A] Patient Active Problem List   Diagnosis Date Noted  . Shoulder dislocation, right, initial encounter 05/08/2019  . Fall at home, initial encounter 05/07/2019  . Closed fracture of superior ramus of pubis, initial encounter ([place]) 05/07/2019  . Hypothyroidism 05/07/2019  . Closed dislocation of right shoulder 05/07/2019  . Prolonged QT interval 05/07/2019   PCP:  Rebekah Pound, MD Pharmacy:   [id number] [place], [place] - [address] [place] [address] Phone: [phone] Fax: [phone]  [id number] - [place], [place] [id number] [place] [id number] Phone: [phone] Fax: [phone]  [id number] - [place], [place] [address] [place] [address] Phone: [phone] Fax: [phone]     Social Determinants of Health ([place]) Interventions    Readmission Risk Interventions No flowsheet data found.

## 2019-05-08 NOTE — Discharge Summary (Signed)
Physician Discharge Summary  DEJANELLE Bush [id number] DOB: July 16, 1929 DOA: 05/07/2019  PCP: Gavin Pound, MD  Admit date: 05/07/2019 Discharge date: 05/08/2019  Admitted From: Home Disposition: Home to daughter's house  Recommendations for Outpatient Follow-up:  1. Follow up with PCP in 1-2 weeks 2. Follow-up with orthopedics, Dr. Erlinda Hong in 2 weeks; continue nonweightbearing to right upper extremity  Home Health: PT/OT/RN/aide Equipment/Devices: Wheelchair, 3 and 1 bedside commode  Discharge Condition: Stable CODE STATUS: Full code Diet recommendation: Heart healthy diet  History of present illness:  Rebekah Bush is a 86 y.o. female with medical history significant of hypertension, hypothyroidism, and gout.  Patient presents with complaints of right shoulder pain.  She presents with complaints of right shoulder pain and discomfort.  Patient lives alone and is normally able to complete all of her ADLs without assistance.  She was making her bed 2 days ago when she slipped on the wooden floor.  Reports that her cell phone was on the table charging and her life alert was on the dresser where she could not reach.  She has been at least 24 hours on the ground scooting around on her back prior to family coming in finding her.  Family called EMS, but after patient was able to get help right she felt somewhat better.  However, yesterday she felt more weakness and noted diffuse pain in her right arm that did not improved with over-the-counter medications.  She also reported some numbness and tingling in her fingers on the right hand.  Her daughter brought her in to be evaluated.  ED Course: Upon admission into the emergency department patient was noted to be afebrile, pulse 75-1 04, respirations 13-44, blood pressures 119/78-180 3/75, and O2 saturation maintained on room air.  Labs significant for WBC 18.4, hemoglobin 15.3, potassium 3, BUN 42, creatinine 0.93, and CK 2032.  X-rays revealed a right  superior pubic ramus fracture and a right shoulder dislocation.  Influenza and COVID-19 screening were negative.  Dr. Erlinda Hong of orthopedics was consulted to evaluate the patient.  Patient was given 500 mL of normal saline IV fluids, fentanyl 25 mcg IV, and propofol was used to reduce right shoulder.  TRH called to admit.   Hospital course:  Pelvic fracture and right shoulder dislocation secondary to fall at home: Acute.   Patient presents after having a fall at home 2 days ago.  She was found to have right superior pelvic ramus fracture and acutely dislocated right shoulder.  Patient was sedated with propofol, and her shoulder was reduced back in place by the ED physician.  Evaluated by orthopedics, Dr. Erlinda Hong; with recommendations of nonweightbearing, and maintain sling until follow-up 2 weeks outpatient.  Continue pain control with hydrocodone-acetaminophen prn.  Will discharge home with daughter, with PT/OT/RN/aide and 3 and 1 bedside commode/wheelchair.  Rhabdomyolysis: Acute.  On admission CK noted to be elevated at 2032.  Patient had been on the floor for over 24 hours as the likely cause of symptoms.    Continue to encourage increased hydration.  Leukocytosis, reactive  WBC elevated at 18.4.  Suspect this is reactive in nature to the acute fracture.  WBC count down to 13.6 at time of discharge.  Prolonged QT interval: Acute.  QTc noted to be 541. Recommend continue to avoid QTC prolonging medications.  Hypokalemia: Acute.   Potassium 3.0 at time of discharge.  Repleted during hospitalization.  Potassium 3.7 at time of discharge.  Hypothyroidism Continue levothyroxine  Discharge Diagnoses:  Principal Problem:  Closed fracture of superior ramus of pubis, initial encounter [facility]) Active Problems:   Fall at home, initial encounter   Hypothyroidism   Closed dislocation of right shoulder   Prolonged QT interval   Shoulder dislocation, right, initial encounter    Discharge  Instructions  Discharge Instructions    Call MD for:  difficulty breathing, headache or visual disturbances   Complete by: As directed    Call MD for:  extreme fatigue   Complete by: As directed    Call MD for:  persistant dizziness or light-headedness   Complete by: As directed    Call MD for:  persistant nausea and vomiting   Complete by: As directed    Call MD for:  severe uncontrolled pain   Complete by: As directed    Call MD for:  temperature >100.4   Complete by: As directed    Diet - low sodium heart healthy   Complete by: As directed    Increase activity slowly   Complete by: As directed      Allergies as of 05/08/2019      Reactions   Sulfa Antibiotics Shortness Of Breath, Nausea And Vomiting, Other (See Comments)   Headache   Bactrim [sulfamethoxazole-trimethoprim]       Medication List    TAKE these medications   calcium-vitamin D 500-200 MG-UNIT tablet Commonly known as: OSCAL WITH D Take 1 tablet by mouth daily.   furosemide 20 MG tablet Commonly known as: LASIX Take 20 mg by mouth daily.   hydrALAZINE 50 MG tablet Commonly known as: APRESOLINE Take 50 mg by mouth 2 (two) times daily.   HYDROcodone-acetaminophen 5-325 MG tablet Commonly known as: NORCO/VICODIN Take 1 tablet by mouth every 6 (six) hours as needed for moderate pain.   ibuprofen 200 MG tablet Commonly known as: ADVIL Take 400 mg by mouth every 6 (six) hours as needed for fever or moderate pain.   levothyroxine 75 MCG tablet Commonly known as: SYNTHROID Take 75 mcg by mouth daily.   MAGNESIUM PO Take 1 tablet by mouth daily.   metoprolol succinate 100 MG 24 hr tablet Commonly known as: TOPROL-XL Take 100 mg by mouth daily. Take with or immediately following a meal.   quinapril 20 MG tablet Commonly known as: ACCUPRIL Take 20 mg by mouth at bedtime.   VITAMIN D3 PO Take 2 capsules by mouth daily.            Durable Medical Equipment  (From admission, onward)          Start     Ordered   05/08/19 1343  For home use only DME standard manual wheelchair with seat cushion  Once    Comments: Patient suffers from right shoulder dislocation and pelvic rami fracture which impairs their ability to perform daily activities like ambulates in the home.  A cane or walker will not resolve issue with performing activities of daily living. A wheelchair will allow patient to safely perform daily activities. Patient can safely propel the wheelchair in the home or has a caregiver who can provide assistance. Length of need 6 months Accessories: elevating leg rests (ELRs), wheel locks, extensions and anti-tippers.   05/08/19 1342   05/08/19 1342  For home use only DME 3 n 1  Once     05/08/19 1342         Follow-up Information    Nnodi, Adaku, MD. Call in 1 week(s).   Specialty: Family Medicine Contact information: [place]  [place] [address] ET:[phone]        Leandrew Koyanagi, MD. Schedule an appointment as soon as possible for a visit in 2 week(s).   Specialty: Orthopedic Surgery Contact information: [place] [address] [phone]          Allergies  Allergen Reactions  . Sulfa Antibiotics Shortness Of Breath, Nausea And Vomiting and Other (See Comments)    Headache   . Bactrim [Sulfamethoxazole-Trimethoprim]     Consultations:  Orthopedics - Dr. Erlinda Hong   Procedures/Studies: DG Shoulder 1 View Right  Result Date: 05/07/2019 CLINICAL DATA:  86 year old who fell 2 days ago, complaining of RIGHT upper arm pain and RIGHT shoulder pain. Patient had an glenohumeral dislocation earlier today which has been reduced. Post reduction image. EXAM: RIGHT SHOULDER - 1 VIEW 12:34 p.m.: COMPARISON:  RIGHT shoulder x-rays earlier same day 11:51 a.m. and 10:09 a.m. FINDINGS: Anatomic alignment of the glenohumeral joint post reduction. The lucency in the inferior glenoid questioned on the examination at 11:51 a.m. is inconspicuous  on the current image. However, there is now evidence of mild acromioclavicular separation which was not present on the prior shoulder imaging earlier today. IMPRESSION: 1. Anatomic alignment of the glenohumeral joint post reduction. 2. The lucency in the inferior glenoid questioned on the examination earlier today is inconspicuous on the current image. 3. Mild acromioclavicular separation is suspected. Electronically Signed   By: Evangeline Dakin M.D.   On: 05/07/2019 13:28   DG Shoulder Right  Result Date: 05/07/2019 CLINICAL DATA:  Severe right shoulder pain and limited range of motion since a fall at home yesterday. EXAM: RIGHT SHOULDER - 2+ VIEW COMPARISON:  None FINDINGS: There is anterior dislocation of the right humeral head. No visible fracture. Aortic atherosclerosis. IMPRESSION: Anterior dislocation of the right humeral head. Aortic Atherosclerosis (ICD10-I70.0). Electronically Signed   By: Lorriane Shire M.D.   On: 05/07/2019 10:23   DG Shoulder Right Portable  Result Date: 05/07/2019 CLINICAL DATA:  Post reduction.  Initial encounter EXAM: PORTABLE RIGHT SHOULDER COMPARISON:  Same day. FINDINGS: Interval reduction of previously seen anterior right shoulder dislocation. There is a curvilinear lucency in the inferior aspect of the glenoid, of indeterminate age. Humeral head is high riding with near complete loss of the acromial humeral interval. Degenerative changes in the right acromioclavicular joint. Visualized portion of the right chest is unremarkable. IMPRESSION: 1. Interval reduction of previously seen right anterior shoulder dislocation. Curvilinear lucency within the inferior aspect of the glenoid is of indeterminate age. Difficult to exclude a fracture. 2. High-riding right humeral head is indicative of chronic rotator cuff tear. 3. Right acromioclavicular joint osteoarthritis. Electronically Signed   By: Lorin Picket M.D.   On: 05/07/2019 12:23   DG Humerus Right  Result Date:  05/07/2019 CLINICAL DATA:  Right arm pain, fall EXAM: RIGHT HUMERUS - 2+ VIEW COMPARISON:  None. FINDINGS: Anterior and inferior shoulder dislocation is identified. No fracture identified. No radio-opaque foreign bodies. IMPRESSION: Anterior and inferior shoulder dislocation. Electronically Signed   By: Kerby Moors M.D.   On: 05/07/2019 09:40   DG Hip Unilat W or Wo Pelvis 1 View Right  Result Date: 05/07/2019 CLINICAL DATA:  Right hip and leg pain secondary to a fall at home yesterday. EXAM: DG HIP (WITH OR WITHOUT PELVIS) 1V RIGHT COMPARISON:  None. FINDINGS: There is a fracture of the right superior pubic ramus. Suspect there is a fracture of the right pubic body. There is severe arthritis of right hip  joint with medial joint space narrowing and marginal osteophyte formation on the femoral head. IMPRESSION: Fractures of the right superior pubic ramus and right pubic body. Severe arthritis of the right hip. Electronically Signed   By: Lorriane Shire M.D.   On: 05/07/2019 10:26   DG Femur Min 2 Views Right  Result Date: 05/07/2019 CLINICAL DATA:  Right hip and leg pain secondary to a fall at home yesterday. EXAM: RIGHT FEMUR 2 VIEWS COMPARISON:  None FINDINGS: There is no fracture or dislocation of right femur. Chondrocalcinosis at the right knee. Severe arthritis of the right hip joint. Note is made of a fracture of the right superior pubic ramus. IMPRESSION: No acute abnormality of the right femur. Fracture of the right superior pubic ramus. Electronically Signed   By: Lorriane Shire M.D.   On: 05/07/2019 10:29      Subjective: Patient seen and examined bedside, resting comfortably.  Sling in place.  Reports pain controlled, only took 1 dose of hydrocodone overnight.  Requesting to be discharged home.  Updated patient's daughter.  No other complaints or concerns this time.  Denies headache, no chest pain, palpitations, no shortness of breath, no abdominal pain, no weakness, no fatigue, no  paresthesias.  No acute events overnight per nursing staff.  Discharge Exam: Vitals:   05/08/19 1200 05/08/19 1300  BP: (!) 158/89 132/85  Pulse: 78 90  Resp: 19 (!) 21  Temp:    SpO2: 93% 94%   Vitals:   05/08/19 1000 05/08/19 1100 05/08/19 1200 05/08/19 1300  BP: (!) 149/80 (!) 150/67 (!) 158/89 132/85  Pulse: 68 79 78 90  Resp: 18 (!) 22 19 (!) 21  Temp:      TempSrc:      SpO2: 96% 93% 93% 94%  Weight:      Height:        General: Pt is alert, awake, not in acute distress Cardiovascular: RRR, S1/S2 +, no rubs, no gallops Respiratory: CTA bilaterally, no wheezing, no rhonchi Abdominal: Soft, NT, ND, bowel sounds + Extremities: no edema, no cyanosis, sling to right shoulder in place    The results of significant diagnostics from this hospitalization (including imaging, microbiology, ancillary and laboratory) are listed below for reference.     Microbiology: Recent Results (from the past 240 hour(s))  Respiratory Panel by RT PCR (Flu A&B, Covid) - Nasopharyngeal Swab     Status: None   Collection Time: 05/07/19  2:15 PM   Specimen: Nasopharyngeal Swab  Result Value Ref Range Status   SARS Coronavirus 2 by RT PCR NEGATIVE NEGATIVE Final    Comment: (NOTE) SARS-CoV-2 target nucleic acids are NOT DETECTED. The SARS-CoV-2 RNA is generally detectable in upper respiratoy specimens during the acute phase of infection. The lowest concentration of SARS-CoV-2 viral copies this assay can detect is 131 copies/mL. A negative result does not preclude SARS-Cov-2 infection and should not be used as the sole basis for treatment or other patient management decisions. A negative result may occur with  improper specimen collection/handling, submission of specimen other than nasopharyngeal swab, presence of viral mutation(s) within the areas targeted by this assay, and inadequate number of viral copies (<131 copies/mL). A negative result must be combined with clinical observations,  patient history, and epidemiological information. The expected result is Negative. Fact Sheet for Patients:  PinkCheek.be Fact Sheet for Healthcare Providers:  GravelBags.it This test is not yet ap proved or cleared by the [place] FDA and  has been authorized for detection  and/or diagnosis of SARS-CoV-2 by FDA under an Emergency Use Authorization (EUA). This EUA will remain  in effect (meaning this test can be used) for the duration of the COVID-19 declaration under Section 564(b)(1) of the Act, 21 U.S.C. section 360bbb-3(b)(1), unless the authorization is terminated or revoked sooner.    Influenza A by PCR NEGATIVE NEGATIVE Final   Influenza B by PCR NEGATIVE NEGATIVE Final    Comment: (NOTE) The Xpert Xpress SARS-CoV-2/FLU/RSV assay is intended as an aid in  the diagnosis of influenza from Nasopharyngeal swab specimens and  should not be used as a sole basis for treatment. Nasal washings and  aspirates are unacceptable for Xpert Xpress SARS-CoV-2/FLU/RSV  testing. Fact Sheet for Patients: PinkCheek.be Fact Sheet for Healthcare Providers: GravelBags.it This test is not yet approved or cleared by the [place] FDA and  has been authorized for detection and/or diagnosis of SARS-CoV-2 by  FDA under an Emergency Use Authorization (EUA). This EUA will remain  in effect (meaning this test can be used) for the duration of the  Covid-19 declaration under Section 564(b)(1) of the Act, 21  U.S.C. section 360bbb-3(b)(1), unless the authorization is  terminated or revoked. Performed at [facility], [place] [address], [place], [place] [address]      Labs: BNP (last 3 results) No results for input(s): BNP in the last 8760 hours. Basic Metabolic Panel: Recent Labs  Lab 05/07/19 0927 05/08/19 0500  NA 139 139  K 3.0* 3.7  CL 102 108  CO2 26 24   GLUCOSE 128* 100*  BUN 42* 27*  CREATININE 0.93 0.66  CALCIUM 9.4 8.6*  MG  --  2.1   Liver Function Tests: Recent Labs  Lab 05/07/19 0927  AST 94*  ALT 40  ALKPHOS 67  BILITOT 1.5*  PROT 6.5  ALBUMIN 3.6   No results for input(s): LIPASE, AMYLASE in the last 168 hours. No results for input(s): AMMONIA in the last 168 hours. CBC: Recent Labs  Lab 05/07/19 0927 05/08/19 0500  WBC 18.4* 13.6*  NEUTROABS 16.2*  --   HGB 15.3* 13.5  HCT 46.2* 41.1  MCV 90.9 92.8  PLT 252 199   Cardiac Enzymes: Recent Labs  Lab 05/07/19 0927 05/08/19 0500  CKTOTAL 2,032* 771*   BNP: Invalid input(s): POCBNP CBG: No results for input(s): GLUCAP in the last 168 hours. D-Dimer No results for input(s): DDIMER in the last 72 hours. Hgb A1c No results for input(s): HGBA1C in the last 72 hours. Lipid Profile No results for input(s): CHOL, HDL, LDLCALC, TRIG, CHOLHDL, LDLDIRECT in the last 72 hours. Thyroid function studies No results for input(s): TSH, T4TOTAL, T3FREE, THYROIDAB in the last 72 hours.  Invalid input(s): FREET3 Anemia work up No results for input(s): VITAMINB12, FOLATE, FERRITIN, TIBC, IRON, RETICCTPCT in the last 72 hours. Urinalysis    Component Value Date/Time   COLORURINE YELLOW 05/07/2019 0913   APPEARANCEUR CLEAR 05/07/2019 0913   LABSPEC 1.026 05/07/2019 0913   PHURINE 6.0 05/07/2019 0913   GLUCOSEU NEGATIVE 05/07/2019 0913   HGBUR NEGATIVE 05/07/2019 0913   BILIRUBINUR NEGATIVE 05/07/2019 0913   KETONESUR 20 (A) 05/07/2019 0913   PROTEINUR 100 (A) 05/07/2019 0913   UROBILINOGEN 1.0 01/23/2013 1928   NITRITE NEGATIVE 05/07/2019 0913   LEUKOCYTESUR NEGATIVE 05/07/2019 0913   Sepsis Labs Invalid input(s): PROCALCITONIN,  WBC,  LACTICIDVEN Microbiology Recent Results (from the past 240 hour(s))  Respiratory Panel by RT PCR (Flu A&B, Covid) - Nasopharyngeal Swab  Status: None   Collection Time: 05/07/19  2:15 PM   Specimen: Nasopharyngeal Swab   Result Value Ref Range Status   SARS Coronavirus 2 by RT PCR NEGATIVE NEGATIVE Final    Comment: (NOTE) SARS-CoV-2 target nucleic acids are NOT DETECTED. The SARS-CoV-2 RNA is generally detectable in upper respiratoy specimens during the acute phase of infection. The lowest concentration of SARS-CoV-2 viral copies this assay can detect is 131 copies/mL. A negative result does not preclude SARS-Cov-2 infection and should not be used as the sole basis for treatment or other patient management decisions. A negative result may occur with  improper specimen collection/handling, submission of specimen other than nasopharyngeal swab, presence of viral mutation(s) within the areas targeted by this assay, and inadequate number of viral copies (<131 copies/mL). A negative result must be combined with clinical observations, patient history, and epidemiological information. The expected result is Negative. Fact Sheet for Patients:  PinkCheek.be Fact Sheet for Healthcare Providers:  GravelBags.it This test is not yet ap proved or cleared by the [place] FDA and  has been authorized for detection and/or diagnosis of SARS-CoV-2 by FDA under an Emergency Use Authorization (EUA). This EUA will remain  in effect (meaning this test can be used) for the duration of the COVID-19 declaration under Section 564(b)(1) of the Act, 21 U.S.C. section 360bbb-3(b)(1), unless the authorization is terminated or revoked sooner.    Influenza A by PCR NEGATIVE NEGATIVE Final   Influenza B by PCR NEGATIVE NEGATIVE Final    Comment: (NOTE) The Xpert Xpress SARS-CoV-2/FLU/RSV assay is intended as an aid in  the diagnosis of influenza from Nasopharyngeal swab specimens and  should not be used as a sole basis for treatment. Nasal washings and  aspirates are unacceptable for Xpert Xpress SARS-CoV-2/FLU/RSV  testing. Fact Sheet for  Patients: PinkCheek.be Fact Sheet for Healthcare Providers: GravelBags.it This test is not yet approved or cleared by the [place] FDA and  has been authorized for detection and/or diagnosis of SARS-CoV-2 by  FDA under an Emergency Use Authorization (EUA). This EUA will remain  in effect (meaning this test can be used) for the duration of the  Covid-19 declaration under Section 564(b)(1) of the Act, 21  U.S.C. section 360bbb-3(b)(1), unless the authorization is  terminated or revoked. Performed at [facility], [place] [address], [place], [place] [address]      Time coordinating discharge: Over 30 minutes  SIGNED:   Yanci Bachtell J [place] DO  [facility] 05/08/2019, 1:54 PM

## 2019-05-08 NOTE — Care Management Obs Status (Signed)
[place] NOTIFICATION   Patient Details  Name: IYUNNA SCHLENDER MRN: [id number] Date of Birth: 12/21/1929   Medicare Observation Status Notification Given:  Yes    Erenest Rasher, RN 05/08/2019, 2:36 PM

## 2019-05-10 ENCOUNTER — Telehealth: Payer: Self-pay | Admitting: *Deleted

## 2019-05-10 DIAGNOSIS — Z4789 Encounter for other orthopedic aftercare: Secondary | ICD-10-CM | POA: Diagnosis not present

## 2019-05-10 NOTE — Telephone Encounter (Signed)
TOC CM reached out to the pt and she [facility] agency has started her HH. She went and purchased a bedside commode. States her wheelchair is scheduled for delivery today. [place], [place] ED TOC CM [phone]

## 2019-05-14 ENCOUNTER — Telehealth: Payer: Self-pay | Admitting: Orthopaedic Surgery

## 2019-05-14 NOTE — Telephone Encounter (Signed)
No ROM of the right arm.  Sling at all times.  WBAT BLE.  NWB RUE.

## 2019-05-14 NOTE — Telephone Encounter (Signed)
Rebekah Bush from [facility] PT called.   He needs to know the weight baring status for both legs as well as right arm. He also needs to know if there is any range of motion restriction for the right arm.  Call back number: [phone]

## 2019-05-15 ENCOUNTER — Telehealth: Payer: Self-pay | Admitting: Orthopaedic Surgery

## 2019-05-15 NOTE — Telephone Encounter (Signed)
Jim from [place] called.   He was following up on the call he made yesterday. He needs to know the patient's weight baring status on her legs and shoulder.   Call back number: [phone]

## 2019-05-16 NOTE — Telephone Encounter (Signed)
Advised Sonia Side message below.

## 2019-05-18 DIAGNOSIS — N183 Chronic kidney disease, stage 3 unspecified: Secondary | ICD-10-CM | POA: Diagnosis not present

## 2019-05-18 DIAGNOSIS — E039 Hypothyroidism, unspecified: Secondary | ICD-10-CM | POA: Diagnosis not present

## 2019-05-18 DIAGNOSIS — M810 Age-related osteoporosis without current pathological fracture: Secondary | ICD-10-CM | POA: Diagnosis not present

## 2019-05-18 DIAGNOSIS — M1990 Unspecified osteoarthritis, unspecified site: Secondary | ICD-10-CM | POA: Diagnosis not present

## 2019-05-18 DIAGNOSIS — I10 Essential (primary) hypertension: Secondary | ICD-10-CM | POA: Diagnosis not present

## 2019-05-22 DIAGNOSIS — F410 Panic disorder [episodic paroxysmal anxiety] without agoraphobia: Secondary | ICD-10-CM | POA: Diagnosis not present

## 2019-06-04 DIAGNOSIS — I10 Essential (primary) hypertension: Secondary | ICD-10-CM | POA: Diagnosis not present

## 2019-06-04 DIAGNOSIS — E039 Hypothyroidism, unspecified: Secondary | ICD-10-CM | POA: Diagnosis not present

## 2019-06-06 DIAGNOSIS — S43004S Unspecified dislocation of right shoulder joint, sequela: Secondary | ICD-10-CM | POA: Diagnosis not present

## 2019-06-06 DIAGNOSIS — Z9181 History of falling: Secondary | ICD-10-CM | POA: Diagnosis not present

## 2019-06-06 DIAGNOSIS — M6281 Muscle weakness (generalized): Secondary | ICD-10-CM | POA: Diagnosis not present

## 2019-06-06 DIAGNOSIS — R262 Difficulty in walking, not elsewhere classified: Secondary | ICD-10-CM | POA: Diagnosis not present

## 2019-06-06 DIAGNOSIS — R2681 Unsteadiness on feet: Secondary | ICD-10-CM | POA: Diagnosis not present

## 2019-06-07 ENCOUNTER — Ambulatory Visit: Payer: Medicare Other | Admitting: Orthopaedic Surgery

## 2019-06-07 DIAGNOSIS — S43004S Unspecified dislocation of right shoulder joint, sequela: Secondary | ICD-10-CM | POA: Diagnosis not present

## 2019-06-07 DIAGNOSIS — Z23 Encounter for immunization: Secondary | ICD-10-CM | POA: Diagnosis not present

## 2019-06-07 DIAGNOSIS — R2681 Unsteadiness on feet: Secondary | ICD-10-CM | POA: Diagnosis not present

## 2019-06-07 DIAGNOSIS — M6281 Muscle weakness (generalized): Secondary | ICD-10-CM | POA: Diagnosis not present

## 2019-06-07 DIAGNOSIS — Z9181 History of falling: Secondary | ICD-10-CM | POA: Diagnosis not present

## 2019-06-07 DIAGNOSIS — R262 Difficulty in walking, not elsewhere classified: Secondary | ICD-10-CM | POA: Diagnosis not present

## 2019-06-08 ENCOUNTER — Ambulatory Visit (INDEPENDENT_AMBULATORY_CARE_PROVIDER_SITE_OTHER): Payer: Medicare Other

## 2019-06-08 ENCOUNTER — Other Ambulatory Visit: Payer: Self-pay

## 2019-06-08 ENCOUNTER — Encounter: Payer: Self-pay | Admitting: Orthopaedic Surgery

## 2019-06-08 ENCOUNTER — Ambulatory Visit (INDEPENDENT_AMBULATORY_CARE_PROVIDER_SITE_OTHER): Payer: Medicare Other | Admitting: Orthopaedic Surgery

## 2019-06-08 VITALS — Ht 60.0 in | Wt 117.0 lb

## 2019-06-08 DIAGNOSIS — M25511 Pain in right shoulder: Secondary | ICD-10-CM

## 2019-06-08 DIAGNOSIS — S43004A Unspecified dislocation of right shoulder joint, initial encounter: Secondary | ICD-10-CM | POA: Diagnosis not present

## 2019-06-08 NOTE — Progress Notes (Signed)
   Office Visit Note   Patient: Rebekah Bush           Date of Birth: May 03, 1929           MRN: [id number] Visit Date: 06/08/2019              Requested by: Kristen Loader, [place],  [place] [address] PCP: Kristen Loader, FNP   Assessment & Plan: Visit Diagnoses:  1. Closed dislocation of right shoulder, initial encounter   2. Acute pain of right shoulder     Plan:  At this point she can begin weaning the sling and we will place her in therapy for shoulder rehab at [facility].  We will recheck her in 4 weeks.  Follow-Up Instructions: Return in about 4 weeks (around 07/06/2019).   Orders:  Orders Placed This Encounter  Procedures  . XR Shoulder Right   No orders of the defined types were placed in this encounter.     Procedures: No procedures performed   Clinical Data: No additional findings.   Subjective: Chief Complaint  Patient presents with  . Right Shoulder - Follow-up    Rebekah Bush is a 86 year old female who is 4 weeks status post right shoulder dislocation that was reduced in the ER.  She follows up today.  She is doing well reports no pain.  She is eager to discontinue the sling.  She has some very mild discomfort in the upper arm deltoid region and some mild numbness and tingling in the long and ring finger.   Review of Systems   Objective: Vital Signs: Ht 5' (1.524 m)   Wt 117 lb (53.1 kg)   BMI 22.85 kg/m   Physical Exam  Ortho Exam Right shoulder exam left shows active range of motion up to the level of the shoulder without pain.  Gentle passive range of motion without pain. Specialty Comments:  No specialty comments available.  Imaging: XR Shoulder Right  Result Date: 06/08/2019 Mild osteoarthritis of the glenohumeral joint.  No evidence of dislocation.    PMFS History: Patient Active Problem List   Diagnosis Date Noted  . Shoulder dislocation, right, initial encounter 05/08/2019  . Fall at home, initial  encounter 05/07/2019  . Closed fracture of superior ramus of pubis, initial encounter ([place]) 05/07/2019  . Hypothyroidism 05/07/2019  . Closed dislocation of right shoulder 05/07/2019  . Prolonged QT interval 05/07/2019   Past Medical History:  Diagnosis Date  . Gout   . Hypertension   . Hyponatremia     History reviewed. No pertinent family history.  Past Surgical History:  Procedure Laterality Date  . CHOLECYSTECTOMY    . RETINAL DETACHMENT SURGERY     Social History   Occupational History  . Not on file  Tobacco Use  . Smoking status: Never Smoker  . Smokeless tobacco: Never Used  Substance and Sexual Activity  . Alcohol use: Yes  . Drug use: Never  . Sexual activity: Not on file

## 2019-06-09 DIAGNOSIS — Z4789 Encounter for other orthopedic aftercare: Secondary | ICD-10-CM | POA: Diagnosis not present

## 2019-06-11 DIAGNOSIS — M6281 Muscle weakness (generalized): Secondary | ICD-10-CM | POA: Diagnosis not present

## 2019-06-11 DIAGNOSIS — S43004S Unspecified dislocation of right shoulder joint, sequela: Secondary | ICD-10-CM | POA: Diagnosis not present

## 2019-06-11 DIAGNOSIS — Z9181 History of falling: Secondary | ICD-10-CM | POA: Diagnosis not present

## 2019-06-11 DIAGNOSIS — R262 Difficulty in walking, not elsewhere classified: Secondary | ICD-10-CM | POA: Diagnosis not present

## 2019-06-11 DIAGNOSIS — R2681 Unsteadiness on feet: Secondary | ICD-10-CM | POA: Diagnosis not present

## 2019-06-12 DIAGNOSIS — M6281 Muscle weakness (generalized): Secondary | ICD-10-CM | POA: Diagnosis not present

## 2019-06-12 DIAGNOSIS — S43004S Unspecified dislocation of right shoulder joint, sequela: Secondary | ICD-10-CM | POA: Diagnosis not present

## 2019-06-12 DIAGNOSIS — R2681 Unsteadiness on feet: Secondary | ICD-10-CM | POA: Diagnosis not present

## 2019-06-12 DIAGNOSIS — Z9181 History of falling: Secondary | ICD-10-CM | POA: Diagnosis not present

## 2019-06-12 DIAGNOSIS — R262 Difficulty in walking, not elsewhere classified: Secondary | ICD-10-CM | POA: Diagnosis not present

## 2019-06-13 DIAGNOSIS — R262 Difficulty in walking, not elsewhere classified: Secondary | ICD-10-CM | POA: Diagnosis not present

## 2019-06-13 DIAGNOSIS — R2681 Unsteadiness on feet: Secondary | ICD-10-CM | POA: Diagnosis not present

## 2019-06-13 DIAGNOSIS — Z9181 History of falling: Secondary | ICD-10-CM | POA: Diagnosis not present

## 2019-06-13 DIAGNOSIS — M6281 Muscle weakness (generalized): Secondary | ICD-10-CM | POA: Diagnosis not present

## 2019-06-13 DIAGNOSIS — S43004S Unspecified dislocation of right shoulder joint, sequela: Secondary | ICD-10-CM | POA: Diagnosis not present

## 2019-06-14 DIAGNOSIS — R2681 Unsteadiness on feet: Secondary | ICD-10-CM | POA: Diagnosis not present

## 2019-06-14 DIAGNOSIS — S43004S Unspecified dislocation of right shoulder joint, sequela: Secondary | ICD-10-CM | POA: Diagnosis not present

## 2019-06-14 DIAGNOSIS — Z9181 History of falling: Secondary | ICD-10-CM | POA: Diagnosis not present

## 2019-06-14 DIAGNOSIS — M6281 Muscle weakness (generalized): Secondary | ICD-10-CM | POA: Diagnosis not present

## 2019-06-14 DIAGNOSIS — R262 Difficulty in walking, not elsewhere classified: Secondary | ICD-10-CM | POA: Diagnosis not present

## 2019-06-15 DIAGNOSIS — R2681 Unsteadiness on feet: Secondary | ICD-10-CM | POA: Diagnosis not present

## 2019-06-15 DIAGNOSIS — M6281 Muscle weakness (generalized): Secondary | ICD-10-CM | POA: Diagnosis not present

## 2019-06-15 DIAGNOSIS — Z9181 History of falling: Secondary | ICD-10-CM | POA: Diagnosis not present

## 2019-06-15 DIAGNOSIS — S43004S Unspecified dislocation of right shoulder joint, sequela: Secondary | ICD-10-CM | POA: Diagnosis not present

## 2019-06-15 DIAGNOSIS — R262 Difficulty in walking, not elsewhere classified: Secondary | ICD-10-CM | POA: Diagnosis not present

## 2019-06-18 DIAGNOSIS — S43004S Unspecified dislocation of right shoulder joint, sequela: Secondary | ICD-10-CM | POA: Diagnosis not present

## 2019-06-18 DIAGNOSIS — M6281 Muscle weakness (generalized): Secondary | ICD-10-CM | POA: Diagnosis not present

## 2019-06-18 DIAGNOSIS — R2681 Unsteadiness on feet: Secondary | ICD-10-CM | POA: Diagnosis not present

## 2019-06-18 DIAGNOSIS — Z9181 History of falling: Secondary | ICD-10-CM | POA: Diagnosis not present

## 2019-06-18 DIAGNOSIS — R262 Difficulty in walking, not elsewhere classified: Secondary | ICD-10-CM | POA: Diagnosis not present

## 2019-06-20 DIAGNOSIS — S43004S Unspecified dislocation of right shoulder joint, sequela: Secondary | ICD-10-CM | POA: Diagnosis not present

## 2019-06-20 DIAGNOSIS — Z9181 History of falling: Secondary | ICD-10-CM | POA: Diagnosis not present

## 2019-06-20 DIAGNOSIS — R262 Difficulty in walking, not elsewhere classified: Secondary | ICD-10-CM | POA: Diagnosis not present

## 2019-06-20 DIAGNOSIS — M6281 Muscle weakness (generalized): Secondary | ICD-10-CM | POA: Diagnosis not present

## 2019-06-20 DIAGNOSIS — R2681 Unsteadiness on feet: Secondary | ICD-10-CM | POA: Diagnosis not present

## 2019-06-21 DIAGNOSIS — R2681 Unsteadiness on feet: Secondary | ICD-10-CM | POA: Diagnosis not present

## 2019-06-21 DIAGNOSIS — M6281 Muscle weakness (generalized): Secondary | ICD-10-CM | POA: Diagnosis not present

## 2019-06-21 DIAGNOSIS — Z9181 History of falling: Secondary | ICD-10-CM | POA: Diagnosis not present

## 2019-06-21 DIAGNOSIS — R262 Difficulty in walking, not elsewhere classified: Secondary | ICD-10-CM | POA: Diagnosis not present

## 2019-06-21 DIAGNOSIS — S43004S Unspecified dislocation of right shoulder joint, sequela: Secondary | ICD-10-CM | POA: Diagnosis not present

## 2019-06-25 DIAGNOSIS — Z9181 History of falling: Secondary | ICD-10-CM | POA: Diagnosis not present

## 2019-06-25 DIAGNOSIS — R262 Difficulty in walking, not elsewhere classified: Secondary | ICD-10-CM | POA: Diagnosis not present

## 2019-06-25 DIAGNOSIS — R2681 Unsteadiness on feet: Secondary | ICD-10-CM | POA: Diagnosis not present

## 2019-06-25 DIAGNOSIS — M6281 Muscle weakness (generalized): Secondary | ICD-10-CM | POA: Diagnosis not present

## 2019-06-25 DIAGNOSIS — S43004S Unspecified dislocation of right shoulder joint, sequela: Secondary | ICD-10-CM | POA: Diagnosis not present

## 2019-06-26 DIAGNOSIS — Z9181 History of falling: Secondary | ICD-10-CM | POA: Diagnosis not present

## 2019-06-26 DIAGNOSIS — S43004S Unspecified dislocation of right shoulder joint, sequela: Secondary | ICD-10-CM | POA: Diagnosis not present

## 2019-06-26 DIAGNOSIS — R262 Difficulty in walking, not elsewhere classified: Secondary | ICD-10-CM | POA: Diagnosis not present

## 2019-06-26 DIAGNOSIS — M6281 Muscle weakness (generalized): Secondary | ICD-10-CM | POA: Diagnosis not present

## 2019-06-26 DIAGNOSIS — R2681 Unsteadiness on feet: Secondary | ICD-10-CM | POA: Diagnosis not present

## 2019-06-27 DIAGNOSIS — Z9181 History of falling: Secondary | ICD-10-CM | POA: Diagnosis not present

## 2019-06-27 DIAGNOSIS — M6281 Muscle weakness (generalized): Secondary | ICD-10-CM | POA: Diagnosis not present

## 2019-06-27 DIAGNOSIS — S43004S Unspecified dislocation of right shoulder joint, sequela: Secondary | ICD-10-CM | POA: Diagnosis not present

## 2019-06-27 DIAGNOSIS — R262 Difficulty in walking, not elsewhere classified: Secondary | ICD-10-CM | POA: Diagnosis not present

## 2019-06-27 DIAGNOSIS — R2681 Unsteadiness on feet: Secondary | ICD-10-CM | POA: Diagnosis not present

## 2019-06-28 DIAGNOSIS — R2681 Unsteadiness on feet: Secondary | ICD-10-CM | POA: Diagnosis not present

## 2019-06-28 DIAGNOSIS — R262 Difficulty in walking, not elsewhere classified: Secondary | ICD-10-CM | POA: Diagnosis not present

## 2019-06-28 DIAGNOSIS — M6281 Muscle weakness (generalized): Secondary | ICD-10-CM | POA: Diagnosis not present

## 2019-06-28 DIAGNOSIS — Z9181 History of falling: Secondary | ICD-10-CM | POA: Diagnosis not present

## 2019-06-28 DIAGNOSIS — S43004S Unspecified dislocation of right shoulder joint, sequela: Secondary | ICD-10-CM | POA: Diagnosis not present

## 2019-07-02 DIAGNOSIS — Z9181 History of falling: Secondary | ICD-10-CM | POA: Diagnosis not present

## 2019-07-02 DIAGNOSIS — M6281 Muscle weakness (generalized): Secondary | ICD-10-CM | POA: Diagnosis not present

## 2019-07-02 DIAGNOSIS — S43004S Unspecified dislocation of right shoulder joint, sequela: Secondary | ICD-10-CM | POA: Diagnosis not present

## 2019-07-02 DIAGNOSIS — R2681 Unsteadiness on feet: Secondary | ICD-10-CM | POA: Diagnosis not present

## 2019-07-02 DIAGNOSIS — R262 Difficulty in walking, not elsewhere classified: Secondary | ICD-10-CM | POA: Diagnosis not present

## 2019-07-04 DIAGNOSIS — S43004S Unspecified dislocation of right shoulder joint, sequela: Secondary | ICD-10-CM | POA: Diagnosis not present

## 2019-07-04 DIAGNOSIS — R2681 Unsteadiness on feet: Secondary | ICD-10-CM | POA: Diagnosis not present

## 2019-07-04 DIAGNOSIS — M6281 Muscle weakness (generalized): Secondary | ICD-10-CM | POA: Diagnosis not present

## 2019-07-04 DIAGNOSIS — R262 Difficulty in walking, not elsewhere classified: Secondary | ICD-10-CM | POA: Diagnosis not present

## 2019-07-04 DIAGNOSIS — Z9181 History of falling: Secondary | ICD-10-CM | POA: Diagnosis not present

## 2019-07-05 DIAGNOSIS — M6281 Muscle weakness (generalized): Secondary | ICD-10-CM | POA: Diagnosis not present

## 2019-07-05 DIAGNOSIS — R2681 Unsteadiness on feet: Secondary | ICD-10-CM | POA: Diagnosis not present

## 2019-07-05 DIAGNOSIS — Z9181 History of falling: Secondary | ICD-10-CM | POA: Diagnosis not present

## 2019-07-05 DIAGNOSIS — R262 Difficulty in walking, not elsewhere classified: Secondary | ICD-10-CM | POA: Diagnosis not present

## 2019-07-05 DIAGNOSIS — S43004S Unspecified dislocation of right shoulder joint, sequela: Secondary | ICD-10-CM | POA: Diagnosis not present

## 2019-07-05 DIAGNOSIS — Z23 Encounter for immunization: Secondary | ICD-10-CM | POA: Diagnosis not present

## 2019-07-06 ENCOUNTER — Ambulatory Visit (INDEPENDENT_AMBULATORY_CARE_PROVIDER_SITE_OTHER): Payer: Medicare Other | Admitting: Orthopaedic Surgery

## 2019-07-06 ENCOUNTER — Encounter: Payer: Self-pay | Admitting: Orthopaedic Surgery

## 2019-07-06 ENCOUNTER — Other Ambulatory Visit: Payer: Self-pay

## 2019-07-06 DIAGNOSIS — S43004A Unspecified dislocation of right shoulder joint, initial encounter: Secondary | ICD-10-CM | POA: Diagnosis not present

## 2019-07-06 NOTE — Progress Notes (Signed)
   Office Visit Note   Patient: Rebekah Bush           Date of Birth: [birth year]           MRN: [id number] Visit Date: 07/06/2019              Requested by: Kristen Loader, [address] [place],  [place] [address] PCP: Kristen Loader, FNP   Assessment & Plan: Visit Diagnoses:  1. Closed dislocation of right shoulder, initial encounter     Plan: My impression is 6 weeks status post right shoulder dislocation.  She has recovered well and has compensated well through physical therapy.  At this point she may begin strengthening with PT and OT.  She may follow-up as needed.  Follow-Up Instructions: Return if symptoms worsen or fail to improve.   Orders:  No orders of the defined types were placed in this encounter.  No orders of the defined types were placed in this encounter.     Procedures: No procedures performed   Clinical Data: No additional findings.   Subjective: Chief Complaint  Patient presents with  . Right Shoulder - Pain    Sofiah returns today for follow-up of her right shoulder dislocation.  She is approximately 6 weeks from the injury.  She reports no pain.  She is receiving PT and OT twice a week.   Review of Systems   Objective: Vital Signs: There were no vitals taken for this visit.  Physical Exam  Ortho Exam Right shoulder shows good active range of motion to about 150 of flexion.  Manual muscle testing is just slightly weak but does not reproduce pain. Specialty Comments:  No specialty comments available.  Imaging: No results found.   PMFS History: Patient Active Problem List   Diagnosis Date Noted  . Shoulder dislocation, right, initial encounter 05/08/2019  . Fall at home, initial encounter 05/07/2019  . Closed fracture of superior ramus of pubis, initial encounter ([place]) 05/07/2019  . Hypothyroidism 05/07/2019  . Closed dislocation of right shoulder 05/07/2019  . Prolonged QT interval 05/07/2019   Past Medical History:    Diagnosis Date  . Gout   . Hypertension   . Hyponatremia     History reviewed. No pertinent family history.  Past Surgical History:  Procedure Laterality Date  . CHOLECYSTECTOMY    . RETINAL DETACHMENT SURGERY     Social History   Occupational History  . Not on file  Tobacco Use  . Smoking status: Never Smoker  . Smokeless tobacco: Never Used  Substance and Sexual Activity  . Alcohol use: Yes  . Drug use: Never  . Sexual activity: Not on file

## 2019-07-09 DIAGNOSIS — E039 Hypothyroidism, unspecified: Secondary | ICD-10-CM | POA: Diagnosis not present

## 2019-07-09 DIAGNOSIS — R262 Difficulty in walking, not elsewhere classified: Secondary | ICD-10-CM | POA: Diagnosis not present

## 2019-07-09 DIAGNOSIS — I10 Essential (primary) hypertension: Secondary | ICD-10-CM | POA: Diagnosis not present

## 2019-07-09 DIAGNOSIS — S43004S Unspecified dislocation of right shoulder joint, sequela: Secondary | ICD-10-CM | POA: Diagnosis not present

## 2019-07-09 DIAGNOSIS — Z9181 History of falling: Secondary | ICD-10-CM | POA: Diagnosis not present

## 2019-07-09 DIAGNOSIS — K5901 Slow transit constipation: Secondary | ICD-10-CM | POA: Diagnosis not present

## 2019-07-09 DIAGNOSIS — R2681 Unsteadiness on feet: Secondary | ICD-10-CM | POA: Diagnosis not present

## 2019-07-09 DIAGNOSIS — M6281 Muscle weakness (generalized): Secondary | ICD-10-CM | POA: Diagnosis not present

## 2019-07-11 DIAGNOSIS — M6281 Muscle weakness (generalized): Secondary | ICD-10-CM | POA: Diagnosis not present

## 2019-07-11 DIAGNOSIS — Z9181 History of falling: Secondary | ICD-10-CM | POA: Diagnosis not present

## 2019-07-11 DIAGNOSIS — S43004S Unspecified dislocation of right shoulder joint, sequela: Secondary | ICD-10-CM | POA: Diagnosis not present

## 2019-07-11 DIAGNOSIS — R262 Difficulty in walking, not elsewhere classified: Secondary | ICD-10-CM | POA: Diagnosis not present

## 2019-07-11 DIAGNOSIS — R2681 Unsteadiness on feet: Secondary | ICD-10-CM | POA: Diagnosis not present

## 2019-07-16 DIAGNOSIS — S43004S Unspecified dislocation of right shoulder joint, sequela: Secondary | ICD-10-CM | POA: Diagnosis not present

## 2019-07-16 DIAGNOSIS — R2681 Unsteadiness on feet: Secondary | ICD-10-CM | POA: Diagnosis not present

## 2019-07-16 DIAGNOSIS — R262 Difficulty in walking, not elsewhere classified: Secondary | ICD-10-CM | POA: Diagnosis not present

## 2019-07-16 DIAGNOSIS — Z9181 History of falling: Secondary | ICD-10-CM | POA: Diagnosis not present

## 2019-07-16 DIAGNOSIS — M6281 Muscle weakness (generalized): Secondary | ICD-10-CM | POA: Diagnosis not present

## 2019-07-17 DIAGNOSIS — Z9181 History of falling: Secondary | ICD-10-CM | POA: Diagnosis not present

## 2019-07-17 DIAGNOSIS — R262 Difficulty in walking, not elsewhere classified: Secondary | ICD-10-CM | POA: Diagnosis not present

## 2019-07-17 DIAGNOSIS — I10 Essential (primary) hypertension: Secondary | ICD-10-CM | POA: Diagnosis not present

## 2019-07-17 DIAGNOSIS — E039 Hypothyroidism, unspecified: Secondary | ICD-10-CM | POA: Diagnosis not present

## 2019-07-17 DIAGNOSIS — S43004S Unspecified dislocation of right shoulder joint, sequela: Secondary | ICD-10-CM | POA: Diagnosis not present

## 2019-07-17 DIAGNOSIS — M6281 Muscle weakness (generalized): Secondary | ICD-10-CM | POA: Diagnosis not present

## 2019-07-17 DIAGNOSIS — R2681 Unsteadiness on feet: Secondary | ICD-10-CM | POA: Diagnosis not present

## 2019-07-18 DIAGNOSIS — Z961 Presence of intraocular lens: Secondary | ICD-10-CM | POA: Diagnosis not present

## 2019-07-18 DIAGNOSIS — H353122 Nonexudative age-related macular degeneration, left eye, intermediate dry stage: Secondary | ICD-10-CM | POA: Diagnosis not present

## 2019-07-18 DIAGNOSIS — Z9889 Other specified postprocedural states: Secondary | ICD-10-CM | POA: Diagnosis not present

## 2019-07-18 DIAGNOSIS — H353114 Nonexudative age-related macular degeneration, right eye, advanced atrophic with subfoveal involvement: Secondary | ICD-10-CM | POA: Diagnosis not present

## 2019-08-01 ENCOUNTER — Ambulatory Visit (INDEPENDENT_AMBULATORY_CARE_PROVIDER_SITE_OTHER): Payer: Medicare Other | Admitting: Ophthalmology

## 2019-08-01 ENCOUNTER — Other Ambulatory Visit: Payer: Self-pay

## 2019-08-01 ENCOUNTER — Encounter (INDEPENDENT_AMBULATORY_CARE_PROVIDER_SITE_OTHER): Payer: Self-pay | Admitting: Ophthalmology

## 2019-08-01 DIAGNOSIS — H43813 Vitreous degeneration, bilateral: Secondary | ICD-10-CM | POA: Diagnosis not present

## 2019-08-01 DIAGNOSIS — H35371 Puckering of macula, right eye: Secondary | ICD-10-CM

## 2019-08-01 DIAGNOSIS — H353122 Nonexudative age-related macular degeneration, left eye, intermediate dry stage: Secondary | ICD-10-CM | POA: Diagnosis not present

## 2019-08-01 DIAGNOSIS — H353113 Nonexudative age-related macular degeneration, right eye, advanced atrophic without subfoveal involvement: Secondary | ICD-10-CM | POA: Insufficient documentation

## 2019-08-01 NOTE — Assessment & Plan Note (Signed)

## 2019-08-01 NOTE — Progress Notes (Signed)
08/01/2019     CHIEF COMPLAINT Patient presents for Retina Evaluation   HISTORY OF PRESENT ILLNESS: Rebekah Bush is a 86 y.o. female who presents to the clinic today for:   HPI    Retina Evaluation    In both eyes.  This started 1 year ago.  Associated Symptoms Floaters.  Negative for Flashes.  Context:  near vision.  Treatments tried include no treatments.          Comments    Patient referred by [facility] for AMD.  Patient states she has noticed she has had trouble reading up close for about a year. Patient states that [place] found dry AMD in OS.        Last edited by Gerda Diss, [place] on 08/01/2019 11:16 AM. (History)      Referring physician: Kristen Loader, [place],   [id number]  HISTORICAL INFORMATION:   Selected notes from the MEDICAL RECORD NUMBER    Lab Results  Component Value Date   HGBA1C 5.8 (H) 08/19/2011     CURRENT MEDICATIONS: No current outpatient medications on file. (Ophthalmic Drugs)   No current facility-administered medications for this visit. (Ophthalmic Drugs)   Current Outpatient Medications (Other)  Medication Sig  . calcium-vitamin D (OSCAL WITH D) 500-200 MG-UNIT tablet Take 1 tablet by mouth daily.  . Cholecalciferol (VITAMIN D3 PO) Take 2 capsules by mouth daily.  . furosemide (LASIX) 20 MG tablet Take 20 mg by mouth daily.  . hydrALAZINE (APRESOLINE) 50 MG tablet Take 50 mg by mouth 2 (two) times daily.  Marland Kitchen HYDROcodone-acetaminophen (NORCO/VICODIN) 5-325 MG tablet Take 1 tablet by mouth every 6 (six) hours as needed for moderate pain.  Marland Kitchen ibuprofen (ADVIL) 200 MG tablet Take 400 mg by mouth every 6 (six) hours as needed for fever or moderate pain.  Marland Kitchen levothyroxine (SYNTHROID, LEVOTHROID) 75 MCG tablet Take 75 mcg by mouth daily.  Marland Kitchen MAGNESIUM PO Take 1 tablet by mouth daily.  . metoprolol succinate (TOPROL-XL) 100 MG 24 hr tablet Take 100 mg by mouth daily. Take with or immediately following a meal.  .  quinapril (ACCUPRIL) 20 MG tablet Take 20 mg by mouth at bedtime.   No current facility-administered medications for this visit. (Other)      REVIEW OF SYSTEMS:    ALLERGIES Allergies  Allergen Reactions  . Sulfa Antibiotics Shortness Of Breath, Nausea And Vomiting and Other (See Comments)    Headache   . Bactrim [Sulfamethoxazole-Trimethoprim]     PAST MEDICAL HISTORY Past Medical History:  Diagnosis Date  . Gout   . Hypertension   . Hyponatremia   . Retinal detachment    OD [facility]   Past Surgical History:  Procedure Laterality Date  . CATARACT EXTRACTION Right    Gershon Crane - 1980s   . CATARACT EXTRACTION Left    Gershon Crane - 1980s  . CHOLECYSTECTOMY    . RETINAL DETACHMENT SURGERY      FAMILY HISTORY History reviewed. No pertinent family history.  SOCIAL HISTORY Social History   Tobacco Use  . Smoking status: Never Smoker  . Smokeless tobacco: Never Used  Substance Use Topics  . Alcohol use: Yes  . Drug use: Never         OPHTHALMIC EXAM:  Base Eye Exam    Visual Acuity (Snellen - Linear)      Right Left   Dist cc 20/100-1 20/25+1   Dist ph cc 20/80-2    Correction:  Glasses       Tonometry (Tonopen, 11:17 AM)      Right Left   Pressure 12 14       Pupils      Dark Light Shape React APD   Right   Irregular Minimal None   Left 4 3 Round Brisk None       Visual Fields (Counting fingers)      Left Right    Full Full       Extraocular Movement      Right Left    Full Full       Neuro/Psych    Oriented x3: Yes   Mood/Affect: Normal       Dilation    Both eyes: 1.0% Mydriacyl, 2.5% Phenylephrine @ 11:17 AM        Slit Lamp and Fundus Exam    External Exam      Right Left   External Normal Normal       Slit Lamp Exam      Right Left   Lids/Lashes Normal Normal   Conjunctiva/Sclera White and quiet White and quiet   Cornea Clear Clear   Anterior Chamber Deep and quiet Deep and quiet   Iris Round and reactive  Round and reactive   Lens Posterior chamber intraocular lens, partial subluxation into the anterior chamber.  The temporal optic is anterior to the iris.  There are old posterior synechia of the iris temporally to the posterior capsule.  This is thus a partial capture of the intraocular lens which the patient reports has been in this condition for several decades. Posterior chamber intraocular lens   Anterior Vitreous Normal Normal       Fundus Exam      Right Left   Posterior Vitreous ,,, Posterior vitreous detachment ,,, Posterior vitreous detachment   Disc Normal Normal   C/D Ratio 0.7 0.5   Macula Advanced age related macular degeneration, Retinal pigment epithelial atrophy in the foveal avascular zone region, no cystoid macular edema, no disciform scar, no exudates, no hemorrhage, Retinal pigment epithelial mottling Advanced age related macular degeneration, Retinal pigment epithelial atrophy in the foveal avascular zone region, no cystoid macular edema, no disciform scar, no exudates, no hemorrhage, Retinal pigment epithelial mottling   Vessels Normal Normal   Periphery Normal Normal          IMAGING AND PROCEDURES  Imaging and Procedures for 08/01/19  OCT, Retina - OU - Both Eyes       Right Eye Quality was good. Scan locations included subfoveal. Findings include abnormal foveal contour, retinal drusen , central retinal atrophy, outer retinal atrophy, epiretinal membrane.   Left Eye Quality was good. Scan locations included subfoveal. Findings include abnormal foveal contour, retinal drusen .   Notes OD with central foveal atrophy.  There are accumulations of retinal drusen however there is loss of the outer retinal layers in the central foveal region.  The dental noted of epiretinal membrane noted temporally, no topographic distortion.  OS with multifocal lobular soft drusen deposits in the macular region.  Intact outer retinal layers are seen.                   ASSESSMENT/PLAN:  Advanced nonexudative age-related macular degeneration of right eye without subfoveal involvement The nature of dry age related macular degeneration was discussed with the patient as well as its possible conversion to wet. The results of the AREDS 2 study was discussed with the patient. A diet  rich in dark leafy green vegetables was advised and specific recommendations were made regarding supplements with AREDS 2 formulation . Control of hypertension and serum cholesterol may slow the disease. Smoking cessation is mandatory to slow the disease and diminish the risk of progressing to wet age related macular degeneration. The patient was instructed in the use of an [place] and was told to return immediately for any changes in the Grid. Stressed to the patient do not rub eyes      ICD-10-CM   1. Advanced nonexudative age-related macular degeneration of right eye without subfoveal involvement  H35.3113 OCT, Retina - OU - Both Eyes  2. 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 Posterior vitreous detachment of both eyes  H43.813   4. Right epiretinal membrane  H35.371     1.  2.  3.  Ophthalmic Meds Ordered this visit:  No orders of the defined types were placed in this encounter.      Return in about 1 week (around 08/08/2019) for OPTOS FFA R/L, DILATE OU, COLOR FP.  There are no Patient Instructions on file for this visit.   Explained the diagnoses, plan, and follow up with the patient and they expressed understanding.  Patient expressed understanding of the importance of proper follow up care.   Clent Demark Jianni Batten M.D. Diseases & Surgery of the Retina and Vitreous Retina & Diabetic [place] 08/01/19     Abbreviations: M myopia (nearsighted); A astigmatism; H hyperopia (farsighted); P presbyopia; Mrx spectacle prescription;  CTL contact lenses; OD right eye; OS left eye; OU both eyes  XT exotropia; ET  esotropia; PEK punctate epithelial keratitis; PEE punctate epithelial erosions; DES dry eye syndrome; MGD meibomian gland dysfunction; ATs artificial tears; PFAT's preservative free artificial tears; [place] nuclear sclerotic cataract; PSC posterior subcapsular cataract; ERM epi-retinal membrane; PVD posterior vitreous detachment; RD retinal detachment; DM diabetes mellitus; DR diabetic retinopathy; NPDR non-proliferative diabetic retinopathy; PDR proliferative diabetic retinopathy; CSME clinically significant macular edema; DME diabetic macular edema; dbh dot blot hemorrhages; CWS cotton wool spot; POAG primary open angle glaucoma; C/D cup-to-disc ratio; HVF humphrey visual field; GVF goldmann visual field; OCT optical coherence tomography; IOP intraocular pressure; BRVO Branch retinal vein occlusion; CRVO central retinal vein occlusion; CRAO central retinal artery occlusion; BRAO branch retinal artery occlusion; RT retinal tear; SB scleral buckle; PPV pars plana vitrectomy; VH Vitreous hemorrhage; PRP panretinal laser photocoagulation; IVK intravitreal kenalog; VMT vitreomacular traction; MH Macular hole;  NVD neovascularization of the disc; NVE neovascularization elsewhere; AREDS age related eye disease study; ARMD age related macular degeneration; POAG primary open angle glaucoma; EBMD epithelial/anterior basement membrane dystrophy; ACIOL anterior chamber intraocular lens; IOL intraocular lens; PCIOL posterior chamber intraocular lens; Phaco/IOL phacoemulsification with intraocular lens placement; [place] photorefractive keratectomy; LASIK laser assisted in situ keratomileusis; HTN hypertension; DM diabetes mellitus; COPD chronic obstructive pulmonary disease

## 2019-08-07 ENCOUNTER — Encounter (INDEPENDENT_AMBULATORY_CARE_PROVIDER_SITE_OTHER): Payer: Medicare Other | Admitting: Ophthalmology

## 2019-08-08 ENCOUNTER — Encounter (INDEPENDENT_AMBULATORY_CARE_PROVIDER_SITE_OTHER): Payer: Medicare Other | Admitting: Ophthalmology

## 2019-08-15 ENCOUNTER — Other Ambulatory Visit: Payer: Self-pay

## 2019-08-15 ENCOUNTER — Encounter (INDEPENDENT_AMBULATORY_CARE_PROVIDER_SITE_OTHER): Payer: Self-pay | Admitting: Ophthalmology

## 2019-08-15 ENCOUNTER — Encounter (INDEPENDENT_AMBULATORY_CARE_PROVIDER_SITE_OTHER): Payer: Medicare Other | Admitting: Ophthalmology

## 2019-08-15 ENCOUNTER — Ambulatory Visit (INDEPENDENT_AMBULATORY_CARE_PROVIDER_SITE_OTHER): Payer: Medicare Other | Admitting: Ophthalmology

## 2019-08-15 DIAGNOSIS — H353113 Nonexudative age-related macular degeneration, right eye, advanced atrophic without subfoveal involvement: Secondary | ICD-10-CM

## 2019-08-15 DIAGNOSIS — H353122 Nonexudative age-related macular degeneration, left eye, intermediate dry stage: Secondary | ICD-10-CM | POA: Diagnosis not present

## 2019-08-15 DIAGNOSIS — H353114 Nonexudative age-related macular degeneration, right eye, advanced atrophic with subfoveal involvement: Secondary | ICD-10-CM

## 2019-08-15 MED ORDER — FLUORESCEIN SODIUM 10 % IV SOLN
500.0000 mg | INTRAVENOUS | Status: AC | PRN
  Administered 2019-08-15: 500 mg via INTRAVENOUS

## 2019-08-15 NOTE — Progress Notes (Signed)
08/15/2019     CHIEF COMPLAINT Patient presents for Retina Follow Up   HISTORY OF PRESENT ILLNESS: Rebekah Bush is a 86 y.o. female who presents to the clinic today for:   HPI    Retina Follow Up    Patient presents with  Dry AMD.  In both eyes.  Severity is moderate.  Since onset it is stable.  I, the attending physician,  performed the HPI with the patient and updated documentation appropriately.          Comments    1 Week AMD f\u FFA R\L. FP  Pt states no changes or issues since her visit last week.       Last edited by Hurman Horn, MD on 08/15/2019 10:22 AM. (History)      Referring physician: Roetta Sessions, NP [address] [place],  [place] [id number]  HISTORICAL INFORMATION:   Selected notes from the MEDICAL RECORD NUMBER    Lab Results  Component Value Date   HGBA1C 5.8 (H) 08/19/2011     CURRENT MEDICATIONS: No current outpatient medications on file. (Ophthalmic Drugs)   No current facility-administered medications for this visit. (Ophthalmic Drugs)   Current Outpatient Medications (Other)  Medication Sig  . calcium-vitamin D (OSCAL WITH D) 500-200 MG-UNIT tablet Take 1 tablet by mouth daily.  . Cholecalciferol (VITAMIN D3 PO) Take 2 capsules by mouth daily.  . furosemide (LASIX) 20 MG tablet Take 20 mg by mouth daily.  . hydrALAZINE (APRESOLINE) 50 MG tablet Take 50 mg by mouth 2 (two) times daily.  Marland Kitchen HYDROcodone-acetaminophen (NORCO/VICODIN) 5-325 MG tablet Take 1 tablet by mouth every 6 (six) hours as needed for moderate pain.  Marland Kitchen ibuprofen (ADVIL) 200 MG tablet Take 400 mg by mouth every 6 (six) hours as needed for fever or moderate pain.  Marland Kitchen levothyroxine (SYNTHROID, LEVOTHROID) 75 MCG tablet Take 75 mcg by mouth daily.  Marland Kitchen MAGNESIUM PO Take 1 tablet by mouth daily.  . metoprolol succinate (TOPROL-XL) 100 MG 24 hr tablet Take 100 mg by mouth daily. Take with or immediately following a meal.  . quinapril (ACCUPRIL) 20 MG tablet  Take 20 mg by mouth at bedtime.   No current facility-administered medications for this visit. (Other)      REVIEW OF SYSTEMS:    ALLERGIES Allergies  Allergen Reactions  . Sulfa Antibiotics Shortness Of Breath, Nausea And Vomiting and Other (See Comments)    Headache   . Bactrim [Sulfamethoxazole-Trimethoprim]     PAST MEDICAL HISTORY Past Medical History:  Diagnosis Date  . Gout   . Hypertension   . Hyponatremia   . Retinal detachment    OD [facility]   Past Surgical History:  Procedure Laterality Date  . CATARACT EXTRACTION Right    Gershon Crane - 1980s   . CATARACT EXTRACTION Left    Gershon Crane - 1980s  . CHOLECYSTECTOMY    . RETINAL DETACHMENT SURGERY      FAMILY HISTORY History reviewed. No pertinent family history.  SOCIAL HISTORY Social History   Tobacco Use  . Smoking status: Never Smoker  . Smokeless tobacco: Never Used  Substance Use Topics  . Alcohol use: Yes  . Drug use: Never         OPHTHALMIC EXAM: Base Eye Exam    Visual Acuity (Snellen - Linear)      Right Left   Dist cc 20/200 20/25   Dist ph cc NI    Correction: Glasses  Tonometry (Tonopen, 9:01 AM)      Right Left   Pressure 16 19       Pupils      Pupils Dark Light Shape React APD   Right PERRL 6 6 Irregular Minimal None   Left PERRL 4 3 Round Brisk None       Visual Fields (Counting fingers)      Left Right    Full Full       Neuro/Psych    Oriented x3: Yes   Mood/Affect: Normal       Dilation    Both eyes: 1.0% Mydriacyl, 2.5% Phenylephrine @ 9:02 AM        Slit Lamp and Fundus Exam    External Exam      Right Left   External Normal Normal       Slit Lamp Exam      Right Left   Lids/Lashes Normal Normal   Conjunctiva/Sclera White and quiet White and quiet   Cornea Clear Clear   Anterior Chamber Deep and quiet Deep and quiet   Iris Round and reactive Round and reactive   Lens Posterior chamber intraocular lens, partial subluxation into  the anterior chamber.  The temporal optic is anterior to the iris.  There are old posterior synechia of the iris temporally to the posterior capsule.  This is thus a partial capture of the intraocular lens which the patient reports has been in this condition for several decades. Posterior chamber intraocular lens   Anterior Vitreous Normal Normal       Fundus Exam      Right Left   Posterior Vitreous ,,, Posterior vitreous detachment ,,, Posterior vitreous detachment   Disc Normal Normal   C/D Ratio 0.7 0.5   Macula Advanced age related macular degeneration, Retinal pigment epithelial atrophy in the foveal avascular zone region, no cystoid macular edema, no disciform scar, no exudates, no hemorrhage, Retinal pigment epithelial mottling Advanced age related macular degeneration, Retinal pigment epithelial atrophy in the foveal avascular zone region, no cystoid macular edema, no disciform scar, no exudates, no hemorrhage, Retinal pigment epithelial mottling   Vessels Normal Normal   Periphery Normal Normal          IMAGING AND PROCEDURES  Imaging and Procedures for 08/15/19  Color Fundus Photography Optos - OU - Both Eyes       Right Eye Progression has been stable. Disc findings include normal observations. Macula : drusen, geographic atrophy. Vessels : normal observations. Periphery : normal observations.   Left Eye Disc findings include normal observations. Macula : geographic atrophy, drusen. Vessels : normal observations.   Notes Bilateral intermediate age-related macular degeneration       Fluorescein Angiography Optos (Transit OD)       Injection:  500 mg Fluorescein Sodium 10 % injection   NDC: [phone]   Route: Intravenous, Site: Left ArmRight Eye   Progression has been stable. Early phase findings include staining, window defect. Mid/Late phase findings include staining, window defect.   Left Eye   Progression has been stable. Early phase findings include  staining. Mid/Late phase findings include staining, window defect.   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                ASSESSMENT/PLAN:  No problem-specific Assessment & Plan notes found for this encounter.      ICD-10-CM   1. Advanced nonexudative age-related macular degeneration of right eye without subfoveal  involvement  H35.3113 Color Fundus Photography Optos - OU - Both Eyes    Fluorescein Angiography Optos (Transit OD)    Fluorescein Sodium 10 % injection 500 mg  2. Intermediate stage nonexudative age-related macular degeneration of left eye  H35.3122 Color Fundus Photography Optos - OU - Both Eyes    Fluorescein Angiography Optos (Transit OD)    Fluorescein Sodium 10 % injection 500 mg  3. Advanced nonexudative age-related macular degeneration of right eye with subfoveal involvement  H35.3114     1.  Foveal atrophy, outer retina OD accounts for the level of visual functioning.  There is no signs of wet age-related macular degeneration in either eye.  2.  3.  Ophthalmic Meds Ordered this visit:  Meds ordered this encounter  Medications  . Fluorescein Sodium 10 % injection 500 mg       No follow-ups on file.  There are no Patient Instructions on file for this visit.   Explained the diagnoses, plan, and follow up with the patient and they expressed understanding.  Patient expressed understanding of the importance of proper follow up care.   Clent Demark Keviana Guida M.D. Diseases & Surgery of the Retina and Vitreous Retina & Diabetic [place] 08/15/19     Abbreviations: M myopia (nearsighted); A astigmatism; H hyperopia (farsighted); P presbyopia; Mrx spectacle prescription;  CTL contact lenses; OD right eye; OS left eye; OU both eyes  XT exotropia; ET esotropia; PEK punctate epithelial keratitis; PEE punctate epithelial erosions; DES dry eye syndrome; MGD meibomian gland dysfunction; ATs artificial  tears; PFAT's preservative free artificial tears; [place] nuclear sclerotic cataract; PSC posterior subcapsular cataract; ERM epi-retinal membrane; PVD posterior vitreous detachment; RD retinal detachment; DM diabetes mellitus; DR diabetic retinopathy; NPDR non-proliferative diabetic retinopathy; PDR proliferative diabetic retinopathy; CSME clinically significant macular edema; DME diabetic macular edema; dbh dot blot hemorrhages; CWS cotton wool spot; POAG primary open angle glaucoma; C/D cup-to-disc ratio; HVF humphrey visual field; GVF goldmann visual field; OCT optical coherence tomography; IOP intraocular pressure; BRVO Branch retinal vein occlusion; CRVO central retinal vein occlusion; CRAO central retinal artery occlusion; BRAO branch retinal artery occlusion; RT retinal tear; SB scleral buckle; PPV pars plana vitrectomy; VH Vitreous hemorrhage; PRP panretinal laser photocoagulation; IVK intravitreal kenalog; VMT vitreomacular traction; MH Macular hole;  NVD neovascularization of the disc; NVE neovascularization elsewhere; AREDS age related eye disease study; ARMD age related macular degeneration; POAG primary open angle glaucoma; EBMD epithelial/anterior basement membrane dystrophy; ACIOL anterior chamber intraocular lens; IOL intraocular lens; PCIOL posterior chamber intraocular lens; Phaco/IOL phacoemulsification with intraocular lens placement; [place] photorefractive keratectomy; LASIK laser assisted in situ keratomileusis; HTN hypertension; DM diabetes mellitus; COPD chronic obstructive pulmonary disease

## 2019-08-29 ENCOUNTER — Ambulatory Visit (INDEPENDENT_AMBULATORY_CARE_PROVIDER_SITE_OTHER): Payer: Medicare Other | Admitting: Physician Assistant

## 2019-08-29 ENCOUNTER — Other Ambulatory Visit: Payer: Self-pay

## 2019-08-29 ENCOUNTER — Encounter: Payer: Self-pay | Admitting: Physician Assistant

## 2019-08-29 DIAGNOSIS — Z872 Personal history of diseases of the skin and subcutaneous tissue: Secondary | ICD-10-CM | POA: Diagnosis not present

## 2019-08-29 DIAGNOSIS — D485 Neoplasm of uncertain behavior of skin: Secondary | ICD-10-CM

## 2019-08-29 DIAGNOSIS — C4491 Basal cell carcinoma of skin, unspecified: Secondary | ICD-10-CM

## 2019-08-29 DIAGNOSIS — C4492 Squamous cell carcinoma of skin, unspecified: Secondary | ICD-10-CM

## 2019-08-29 DIAGNOSIS — L570 Actinic keratosis: Secondary | ICD-10-CM

## 2019-08-29 HISTORY — DX: Basal cell carcinoma of skin, unspecified: C44.91

## 2019-08-29 HISTORY — DX: Squamous cell carcinoma of skin, unspecified: C44.92

## 2019-08-29 NOTE — Progress Notes (Signed)
New Patient Visit  Subjective  Rebekah Bush is a 86 y.o. female who presents for the following: Skin Problem (spots on top of scalp- x yrs-scaly, Left cheek-crusty-comes and goes and spots on both arms-"crusty" : pt saw Dr.Lupton in past - AK's only no SCC or BCC). Spot on scalp for years. Dr. Allyson Sabal was checking it. They did biopsy and it showed precancer. She also has a crust on her left forearm and left cheek. After they biopsied the scalp she feels like they may have frozen the lesion. It didn't totally clear but she doesn't feel like its getting any bigger. None of her lesions are sore and none of them bleed. No history of skin cancer but she has had areas frozen in the past.    Objective  Well appearing patient in no apparent distress; mood and affect are within normal limits.  A focused examination was performed including face, back, upper extremities, neck. Relevant physical exam findings are noted in the Assessment and Plan. No suspicious moles noted on back.  Objective  Left Forearm - Posterior (2), Mid Parietal Scalp (2), Right Forearm - Posterior: Erythematous patches with gritty scale.  Objective  Left Buccal Cheek : 2.3 cm to left corner mouth Pink crusted patch       Objective  Right Temple: 2.8 cm to angioma Pink papule       Objective  Left Upper Arm - Posterior: 5.3 cm to angioma Eroded patch       Objective  Left Forearm - Posterior: 4.0 cm to scar Pink crusted patch       Assessment & Plan  AK (actinic keratosis) (5) Left Forearm - Posterior (2); Right Forearm - Posterior; Mid Parietal Scalp (2)  Destruction of lesion - Left Forearm - Posterior, Mid Parietal Scalp, Right Forearm - Posterior Complexity: simple   Destruction method: cryotherapy   Informed consent: discussed and consent obtained   Timeout:  patient name, date of birth, surgical site, and procedure verified Lesion destroyed using liquid nitrogen: Yes   Outcome:  patient tolerated procedure well with no complications   Post-procedure details: wound care instructions given    Neoplasm of uncertain behavior of skin (4) Left Buccal Cheek   Skin / nail biopsy Type of biopsy: tangential   Informed consent: discussed and consent obtained   Procedure prep:  Patient was prepped and draped in usual sterile fashion (Non sterile) Prep type:  Chlorhexidine Anesthesia: the lesion was anesthetized in a standard fashion   Anesthetic:  1% lidocaine w/ epinephrine 1-100,000 local infiltration Instrument used: flexible razor blade   Hemostasis achieved with: ferric subsulfate   Outcome: patient tolerated procedure well   Post-procedure details: wound care instructions given    Specimen 1 - Surgical pathology Differential Diagnosis: R/O BCC vs SCC Check Margins: No  Right Temple  Skin / nail biopsy Type of biopsy: tangential   Informed consent: discussed and consent obtained   Procedure prep:  Patient was prepped and draped in usual sterile fashion (Non sterile) Prep type:  Chlorhexidine Anesthesia: the lesion was anesthetized in a standard fashion   Anesthetic:  1% lidocaine w/ epinephrine 1-100,000 local infiltration Instrument used: flexible razor blade   Hemostasis achieved with: ferric subsulfate   Outcome: patient tolerated procedure well   Post-procedure details: wound care instructions given    Specimen 2 - Surgical pathology Differential Diagnosis: R/O BCC vs SCC Check Margins: No  Left Upper Arm - Posterior  Skin / nail biopsy Type of  biopsy: tangential   Informed consent: discussed and consent obtained   Procedure prep:  Patient was prepped and draped in usual sterile fashion (Non sterile) Prep type:  Chlorhexidine Anesthesia: the lesion was anesthetized in a standard fashion   Anesthetic:  1% lidocaine w/ epinephrine 1-100,000 local infiltration Instrument used: flexible razor blade   Hemostasis achieved with: ferric subsulfate     Outcome: patient tolerated procedure well   Post-procedure details: wound care instructions given    Specimen 3 - Surgical pathology Differential Diagnosis: R/O BCC vs SCC Check Margins: No  Left Forearm - Posterior  Skin / nail biopsy Type of biopsy: tangential   Informed consent: discussed and consent obtained   Procedure prep:  Patient was prepped and draped in usual sterile fashion (Non sterile) Prep type:  Chlorhexidine Anesthesia: the lesion was anesthetized in a standard fashion   Anesthetic:  1% lidocaine w/ epinephrine 1-100,000 local infiltration Instrument used: flexible razor blade   Hemostasis achieved with: ferric subsulfate   Outcome: patient tolerated procedure well   Post-procedure details: wound care instructions given    Specimen 4 - Surgical pathology Differential Diagnosis: R/O BCC vs SCC Check Margins: No

## 2019-08-29 NOTE — Patient Instructions (Signed)

## 2019-09-04 ENCOUNTER — Telehealth: Payer: Self-pay

## 2019-09-04 NOTE — Telephone Encounter (Signed)
-----   Message from Arlyss Gandy, [place] sent at 09/03/2019  8:49 AM EDT ----- Mohs for temple 30 min with me for at least 2 of others. Can do in whatever order works best.

## 2019-09-04 NOTE — Telephone Encounter (Signed)
Attempted to call patient with results; however, call could not be completed and disconnected.

## 2019-09-05 NOTE — Telephone Encounter (Signed)
Phone call to patient with her pathology results. Patient aware of results.  

## 2019-09-05 NOTE — Telephone Encounter (Signed)
-----   Message from Arlyss Gandy, [place] sent at 09/03/2019  8:49 AM EDT ----- Mohs for temple 30 min with me for at least 2 of others. Can do in whatever order works best.

## 2019-09-10 ENCOUNTER — Ambulatory Visit (INDEPENDENT_AMBULATORY_CARE_PROVIDER_SITE_OTHER): Payer: Medicare Other | Admitting: *Deleted

## 2019-09-10 ENCOUNTER — Other Ambulatory Visit: Payer: Self-pay

## 2019-09-10 DIAGNOSIS — L089 Local infection of the skin and subcutaneous tissue, unspecified: Secondary | ICD-10-CM

## 2019-09-10 MED ORDER — MUPIROCIN 2 % EX OINT: 1.0000 "application " | TOPICAL_OINTMENT | Freq: Two times a day (BID) | CUTANEOUS | 1 refills | Status: DC

## 2019-09-10 NOTE — Progress Notes (Signed)
Here for a nts wound check on the left forearm.  Jennifer Clark-Bruning  did bacteria culture and a prescription for mupirocin ointment to use until culture comes back. Patient will call with any problems.

## 2019-09-13 NOTE — Progress Notes (Signed)
Call to see how mupirocin is doing. If better she can just continue it not we can add Doxycycline.

## 2019-09-16 LAB — ANAEROBIC AND AEROBIC CULTURE
MICRO NUMBER:: 10511991
MICRO NUMBER:: 10511992
SPECIMEN QUALITY:: ADEQUATE
SPECIMEN QUALITY:: ADEQUATE

## 2019-09-18 ENCOUNTER — Telehealth: Payer: Self-pay

## 2019-09-18 NOTE — Telephone Encounter (Signed)
-----   Message from Arlyss Gandy, [place] sent at 09/03/2019  8:49 AM EDT ----- Mohs for temple 30 min with me for at least 2 of others. Can do in whatever order works best.

## 2019-09-18 NOTE — Telephone Encounter (Signed)
Phone call to patient to schedule surgery appointment with Arlyss Gandy, PA for CIS x 3.  Voicemail left for patient to give the office a call back.

## 2019-09-19 NOTE — Telephone Encounter (Signed)
-----   Message from Rebekah Bush, [place] sent at 09/13/2019  2:10 PM EDT ----- Call to see how mupirocin is doing. If better she can just continue it not we can add Doxycycline.

## 2019-09-19 NOTE — Telephone Encounter (Signed)
Phone call from patient returning our call for her culture results.  Patient aware of results, patient states that she is doing much better and she would like to continue with current treatment and hold off on the Doxycycline.  Jennifer Clark-Bruning aware.

## 2019-09-20 ENCOUNTER — Telehealth: Payer: Self-pay

## 2019-09-20 NOTE — Telephone Encounter (Signed)
Phone call to patient to schedule appointment with Arlyss Gandy, PA for surgery CIX x 3.  Voicemail left for patient to give the office a call back.

## 2019-09-20 NOTE — Telephone Encounter (Signed)
-----   Message from Arlyss Gandy, [place] sent at 09/03/2019  8:49 AM EDT ----- Mohs for temple 30 min with me for at least 2 of others. Can do in whatever order works best.

## 2019-09-20 NOTE — Telephone Encounter (Signed)
Patient returned call and scheduled appointment with Rebekah Bush on October 31, 2019 at 11:00.

## 2019-10-31 ENCOUNTER — Encounter: Payer: Medicare Other | Admitting: Physician Assistant

## 2020-02-14 ENCOUNTER — Encounter (INDEPENDENT_AMBULATORY_CARE_PROVIDER_SITE_OTHER): Payer: Medicare Other | Admitting: Ophthalmology

## 2020-06-24 ENCOUNTER — Ambulatory Visit (INDEPENDENT_AMBULATORY_CARE_PROVIDER_SITE_OTHER): Payer: Medicare Other | Admitting: Orthopaedic Surgery

## 2020-06-24 ENCOUNTER — Ambulatory Visit: Payer: Medicare Other | Admitting: Orthopaedic Surgery

## 2020-06-24 ENCOUNTER — Ambulatory Visit (INDEPENDENT_AMBULATORY_CARE_PROVIDER_SITE_OTHER): Payer: Medicare Other

## 2020-06-24 ENCOUNTER — Encounter: Payer: Self-pay | Admitting: Orthopaedic Surgery

## 2020-06-24 ENCOUNTER — Other Ambulatory Visit: Payer: Self-pay

## 2020-06-24 DIAGNOSIS — M25551 Pain in right hip: Secondary | ICD-10-CM

## 2020-06-24 NOTE — Progress Notes (Signed)
Office Visit Note   Patient: Rebekah Bush           Date of Birth: 1930/02/08           MRN: [id number] Visit Date: 06/24/2020              Requested by: Roetta Sessions, NP [place] [address] [place],  [place] [address] PCP: Roetta Sessions, NP   Assessment & Plan: Visit Diagnoses:  1. Pain in right hip     Plan: Impression is right hip DJD exacerbation that has greatly improved.  At this point she can resume physical therapy at her facility.  She can come back anytime if she wants to try cortisone injection in the right hip joint.  Otherwise we will see her back as needed.  Follow-Up Instructions: Return if symptoms worsen or fail to improve.   Orders:  Orders Placed This Encounter  Procedures  . XR HIP UNILAT W OR W/O PELVIS 2-3 VIEWS RIGHT   No orders of the defined types were placed in this encounter.     Procedures: No procedures performed   Clinical Data: No additional findings.   Subjective: Chief Complaint  Patient presents with  . Right Hip - Pain    Rebekah Bush comes back today for evaluation of right groin pain.  She states that last week she a lot of pain and difficulty flexing her hip up.  She held off on physical therapy.  This week she is feeling much better.  She is taking ibuprofen as needed.   Review of Systems  Constitutional: Negative.   HENT: Negative.   Eyes: Negative.   Respiratory: Negative.   Cardiovascular: Negative.   Endocrine: Negative.   Musculoskeletal: Negative.   Neurological: Negative.   Hematological: Negative.   Psychiatric/Behavioral: Negative.   All other systems reviewed and are negative.    Objective: Vital Signs: There were no vitals taken for this visit.  Physical Exam Vitals and nursing note reviewed.  Constitutional:      Appearance: She is well-developed and well-nourished.  Pulmonary:     Effort: Pulmonary effort is normal.  Skin:    General: Skin is warm.     Capillary Refill:  Capillary refill takes less than 2 seconds.  Neurological:     Mental Status: She is alert and oriented to person, place, and time.  Psychiatric:        Mood and Affect: Mood and affect normal.        Behavior: Behavior normal.        Thought Content: Thought content normal.        Judgment: Judgment normal.     Ortho Exam Right hip shows moderate limitation in internal/external rotation.  Positive Stinchfield sign.  No tenderness palpation.  Pelvic ring is nontender. Specialty Comments:  No specialty comments available.  Imaging: XR HIP UNILAT W OR W/O PELVIS 2-3 VIEWS RIGHT  Result Date: 06/24/2020 Healed pelvic ring fractures.  Advanced right hip DJD    PMFS History: Patient Active Problem List   Diagnosis Date Noted  . Advanced nonexudative age-related macular degeneration of right eye with subfoveal involvement 08/15/2019  . Intermediate stage nonexudative age-related macular degeneration of left eye 08/01/2019  . Right epiretinal membrane 08/01/2019  . Shoulder dislocation, right, initial encounter 05/08/2019  . Fall at home, initial encounter 05/07/2019  . Closed fracture of superior ramus of pubis, initial encounter ([place]) 05/07/2019  . Hypothyroidism 05/07/2019  . Closed dislocation of right  shoulder 05/07/2019  . Prolonged QT interval 05/07/2019   Past Medical History:  Diagnosis Date  . Basal cell carcinoma 08/29/2019   sclerosis on right temple  . Gout   . Hypertension   . Hyponatremia   . Retinal detachment    OD [facility]  . Squamous cell carcinoma of skin 08/29/2019   in situ on left buccal cheek  . Squamous cell carcinoma of skin 08/29/2019   in situ on left upper arm, posterior  . Squamous cell carcinoma of skin 08/29/2019   in situ on left forearm, posterior    History reviewed. No pertinent family history.  Past Surgical History:  Procedure Laterality Date  . CATARACT EXTRACTION Right    [facility] - 1980s   . CATARACT EXTRACTION Left     [facility] - 1980s  . CHOLECYSTECTOMY    . RETINAL DETACHMENT SURGERY     Social History   Occupational History  . Not on file  Tobacco Use  . Smoking status: Never Smoker  . Smokeless tobacco: Never Used  Vaping Use  . Vaping Use: Never used  Substance and Sexual Activity  . Alcohol use: Yes  . Drug use: Never  . Sexual activity: Not on file

## 2020-07-09 ENCOUNTER — Ambulatory Visit: Payer: Medicare Other | Admitting: Orthopaedic Surgery

## 2020-07-29 NOTE — Progress Notes (Signed)
Office Visit Note  Patient: Rebekah Bush             Date of Birth: 1930-04-01           MRN: [id number]             PCP: Roetta Sessions, NP Referring: Roetta Sessions* Visit Date: 07/30/2020 Occupation: _0 @  Subjective:  New Patient (Initial Visit) (Patient complains of bilateral hand weakness, stiffness, and aches (right (dominant)>left). Patient had an issue with her right hip and leg with weakness in January 2022 and was evaluated, not treated, by Dr. Erlinda Hong- patient is currently having no issues with this. Patient complains of itchy, red rash with burning sensation on bilateral lower legs. )   History of Present Illness: Rebekah Bush is a 87 y.o. female here for evaluation of groin and hip pain and positive ANA with high ESR and CRP. She was originally having an acute onset of right hip pain but this actually improved a lot since her original presentation and has no complaints about it today. She does however have bilateral hand pain, weakness, and stiffness that is ongoing. She also notices a snapping or catching movement in several fingers worst at the right 4th finger not particularly painful. She also has redness, itching, burning type pain, and swelling in her lower legs that is worst at night. She works with OT and PT currently for her hand arthritis and her hips and legs problem. This is partially helpful. She takes ibuprofen 200 mg occasionally which also helps symptoms a lot.   Labs reviewed 04/2020 ANA 1:320 homogenous ESR 49 Uric acid 7.1 RF (pos)  Xray hip Arthritis, linear irregularity without displacement or acute joint changes  Activities of Daily Living:  Patient reports morning stiffness for 20-30 minutes.   Patient Denies nocturnal pain.  Difficulty dressing/grooming: Denies Difficulty climbing stairs: Denies Difficulty getting out of chair: Denies Difficulty using hands for taps, buttons, cutlery, and/or writing: Reports  Review of  Systems  Constitutional: Negative for fatigue.  HENT: Negative for mouth sores, mouth dryness and nose dryness.   Eyes: Negative for pain, itching, visual disturbance and dryness.  Respiratory: Negative for cough, hemoptysis, shortness of breath and difficulty breathing.   Cardiovascular: Positive for swelling in legs/feet. Negative for chest pain and palpitations.  Gastrointestinal: Negative for abdominal pain, blood in stool, constipation and diarrhea.  Endocrine: Negative for increased urination.  Genitourinary: Negative for painful urination.  Musculoskeletal: Positive for arthralgias, joint pain, joint swelling and morning stiffness. Negative for myalgias, muscle weakness, muscle tenderness and myalgias.  Skin: Positive for color change, rash, redness and skin tightness.  Allergic/Immunologic: Negative for susceptible to infections.  Neurological: Positive for numbness. Negative for dizziness, headaches, memory loss and weakness.  Hematological: Negative for swollen glands.  Psychiatric/Behavioral: Negative for confusion and sleep disturbance.    PMFS History:  Patient Active Problem List   Diagnosis Date Noted  . Generalized osteoarthritis 07/30/2020  . Positive ANA (antinuclear antibody) 07/30/2020  . Trigger finger, right ring finger 07/30/2020  . Bilateral hand pain 07/30/2020  . Pain in right hip 07/30/2020  . Rash and other nonspecific skin eruption 07/30/2020  . Advanced nonexudative age-related macular degeneration of right eye with subfoveal involvement 08/15/2019  . Intermediate stage nonexudative age-related macular degeneration of left eye 08/01/2019  . Right epiretinal membrane 08/01/2019  . Shoulder dislocation, right, initial encounter 05/08/2019  . Fall at home, initial encounter 05/07/2019  . Closed fracture of superior ramus of  pubis, initial encounter ([place]) 05/07/2019  . Hypothyroidism 05/07/2019  . Closed dislocation of right shoulder 05/07/2019  . Prolonged  QT interval 05/07/2019    Past Medical History:  Diagnosis Date  . Basal cell carcinoma 08/29/2019   sclerosis on right temple  . Gout   . Hypertension   . Hyponatremia   . Retinal detachment    OD [facility]  . Squamous cell carcinoma of skin 08/29/2019   in situ on left buccal cheek  . Squamous cell carcinoma of skin 08/29/2019   in situ on left upper arm, posterior  . Squamous cell carcinoma of skin 08/29/2019   in situ on left forearm, posterior    Family History  Problem Relation Age of Onset  . Ovarian cancer Mother   . Pulmonary embolism Father   . Hypertension Son    Past Surgical History:  Procedure Laterality Date  . CATARACT EXTRACTION Right    [facility] - 1980s   . CATARACT EXTRACTION Left    [facility] Crane - 1980s  . CHOLECYSTECTOMY    . RETINAL DETACHMENT SURGERY     Social History   Social History Narrative  . Not on file   Immunization History  Administered Date(s) Administered  . Moderna Sars-Covid-2 Vaccination 06/07/2019, 07/05/2019, 02/21/2020     Objective: Vital Signs: BP (!) 170/84 (BP Location: Right Arm, Patient Position: Sitting, Cuff Size: Normal)   Pulse (!) 59   Ht $R'5\' 1"'Iz$  (1.549 m)   Wt 121 lb 3.2 oz (55 kg)   BMI 22.90 kg/m    Physical Exam HENT:     Right Ear: External ear normal.     Left Ear: External ear normal.     Mouth/Throat:     Mouth: Mucous membranes are moist.     Pharynx: Oropharynx is clear.  Eyes:     Conjunctiva/sclera: Conjunctivae normal.  Cardiovascular:     Rate and Rhythm: Normal rate and regular rhythm.  Pulmonary:     Effort: Pulmonary effort is normal.     Breath sounds: Normal breath sounds.  Skin:    General: Skin is warm and dry.     Comments: 2+ pitting edema over bilateral ankles with erythema 1/3 height to knee, tortuous superficial venous varicosities  Neurological:     General: No focal deficit present.     Mental Status: She is alert.     Deep Tendon Reflexes: Reflexes normal.   Psychiatric:        Mood and Affect: Mood normal.     Musculoskeletal Exam:  Left shoulder decreased external rotation full ROM in other directions Elbows full ROM no tenderness or swelling Wrists reduced ROM in flexion and extension b/l Ulnar deviation inf MCP joints of bilateral hands, interosseus muscle wasting, no tenderness or palpable synovitis Triggering with PIP ROM most pronounced in right 4th digit Ankles full ROM no tenderness overlying pedal edema was present  Investigation: No additional findings.  Imaging: XR Hand 2 View Left  Result Date: 07/30/2020 Xray left hand 2 views Radiocarpal joint space complete erosion or absence of ulnar styloid, calcification of TFCC present. Numerous cystic changes in carpal bones. Advanced degenerative arthritis of 1st CMC with partial subluxation. MCP joint narrowing and slight subluxation in 2-3rd digits. PIP joint space slightly reduced severe degenerative change in DIP joints. No erosions seen. Bone mineralization shows generalized osteopenia. Impression Advanced osteoarthritis of the 1st CMC joint and DIP joints. Cystic changes of wrist, ulnar styloid absent and MCP subluxation cannot exclude crystalline  arthropathy or previous inflammatory disease.  XR Hand 2 View Right  Result Date: 07/30/2020 Xray right hand 2 views Reduced radiocarpal joint space and ulnar styloid is eroded or otherwise absent. Mild calcification of TFCC present. Very large cystic appearing changes in carpal bones and base of 1st metacarpal. Moderately advanced 1st CMC joint degenerative arthritis with partial subluxation and 1st MCP joint hyperextension. MCP narrowing and subluxation in 2nd-3rd digits with probable flexor tendon calcifications seen at 2nd MCP. PIP and DIP joints asymmetric narrowing with some joint deviation and osteophytes also some periarticualr or tendon calcifications seen. No periarticular erosions seen. Bone mineralization shows generalized  osteopenia. Impression Advanced osteoarthritis throughout. Ulnar styloid absence, MCP involvement with subluxation cannot exclude crystalline arthropathy or previous inflammatory process   Recent Labs: Lab Results  Component Value Date   WBC 13.6 (H) 05/08/2019   HGB 13.5 05/08/2019   PLT 199 05/08/2019   NA 139 05/08/2019   K 3.7 05/08/2019   CL 108 05/08/2019   CO2 24 05/08/2019   GLUCOSE 100 (H) 05/08/2019   BUN 27 (H) 05/08/2019   CREATININE 0.66 05/08/2019   BILITOT 1.5 (H) 05/07/2019   ALKPHOS 67 05/07/2019   AST 94 (H) 05/07/2019   ALT 40 05/07/2019   PROT 6.5 05/07/2019   ALBUMIN 3.6 05/07/2019   CALCIUM 8.6 (L) 05/08/2019   GFRAA >60 05/08/2019    Speciality Comments: No specialty comments available.  Procedures:  No procedures performed Allergies: Sulfa antibiotics and Bactrim [sulfamethoxazole-trimethoprim]   Assessment / Plan:     Visit Diagnoses: Bilateral hand pain - Plan: Sedimentation rate, Rheumatoid factor, Cyclic citrul peptide antibody, IgG, Anti-Smith antibody, Sjogrens syndrome-A extractable nuclear antibody, Sjogrens syndrome-B extractable nuclear antibody, Anti-DNA antibody, double-stranded, C3 and C4, XR Hand 2 View Right, XR Hand 2 View Left  Bilateral hand pain with no inflammation seen on exam today but has advanced changes with interosseus muscle wasting, reduced wrist ROM, multiple triggering fingers. May be degenerative disease or inactive inflammation. Checking ESR, RF, CCP. Bilateral hand xrays for evaluation any erosive or degenerative changes.  Generalized osteoarthritis - Plan: Sedimentation rate   Left shoulder, wrists, knees, hips on xray report multiple areas reduced ROM appears generalized OA, checking ESR again 51 was borderline for age/sex.  Positive ANA (antinuclear antibody) - Plan: Sedimentation rate, Rheumatoid factor, Cyclic citrul peptide antibody, IgG, Anti-Smith antibody, Sjogrens syndrome-A extractable nuclear antibody,  Sjogrens syndrome-B extractable nuclear antibody, Anti-DNA antibody, double-stranded, C3 and C4  Positive ANA at a fair titer but no clinical features for SLE at this time. Checking ENA panel SSA, SSB, smith, dsDNA, and complements with lower pretest suspicion.  Trigger finger, right ring finger - Plan: Sedimentation rate  Trigger fingers in multiple joints worst in right 4th finger. Discussed treatment option including trial of local steroid injection and will f/u with repeat exam. Using cane in left hand so not contributory.  Pain in right hip  Right hip pain apparently improved spontaneously, no complaints today.  Rash and other nonspecific skin eruption  Lower extremity rash and burning pain looks consistent with venous stasis. She has 2+ pitting edema in the affected area and pain worst after the day. Previously had procedure for right side venous insufficiency/varicosities. She plans to resume use of compressive socks. May need readjustment of diuretics with hypertension and edema noted today.  Orders: Orders Placed This Encounter  Procedures  . XR Hand 2 View Right  . XR Hand 2 View Left  . Sedimentation rate  . Rheumatoid  factor  . Cyclic citrul peptide antibody, IgG  . Anti-Smith antibody  . Sjogrens syndrome-A extractable nuclear antibody  . Sjogrens syndrome-B extractable nuclear antibody  . Anti-DNA antibody, double-stranded  . C3 and C4   No orders of the defined types were placed in this encounter.   Follow-Up Instructions: Return in about 2 weeks (around 08/13/2020) for Bilateral hand pain, triggering, positive labs, leg rash and pain.   Collier Salina, MD  Note - This record has been created using [employer].  Chart creation errors have been sought, but may not always  have been located. Such creation errors do not reflect on  the standard of medical care.

## 2020-07-30 ENCOUNTER — Other Ambulatory Visit: Payer: Self-pay

## 2020-07-30 ENCOUNTER — Encounter: Payer: Self-pay | Admitting: Internal Medicine

## 2020-07-30 ENCOUNTER — Ambulatory Visit (INDEPENDENT_AMBULATORY_CARE_PROVIDER_SITE_OTHER): Payer: Medicare Other | Admitting: Internal Medicine

## 2020-07-30 ENCOUNTER — Ambulatory Visit: Payer: Self-pay

## 2020-07-30 VITALS — BP 170/84 | HR 59 | Ht 61.0 in | Wt 121.2 lb

## 2020-07-30 DIAGNOSIS — M159 Polyosteoarthritis, unspecified: Secondary | ICD-10-CM | POA: Diagnosis not present

## 2020-07-30 DIAGNOSIS — M79641 Pain in right hand: Secondary | ICD-10-CM | POA: Insufficient documentation

## 2020-07-30 DIAGNOSIS — R768 Other specified abnormal immunological findings in serum: Secondary | ICD-10-CM | POA: Diagnosis not present

## 2020-07-30 DIAGNOSIS — M65341 Trigger finger, right ring finger: Secondary | ICD-10-CM | POA: Diagnosis not present

## 2020-07-30 DIAGNOSIS — M25551 Pain in right hip: Secondary | ICD-10-CM

## 2020-07-30 DIAGNOSIS — R21 Rash and other nonspecific skin eruption: Secondary | ICD-10-CM

## 2020-07-30 DIAGNOSIS — M79642 Pain in left hand: Secondary | ICD-10-CM | POA: Diagnosis not present

## 2020-07-30 NOTE — Patient Instructions (Addendum)
Stasis Dermatitis Stasis dermatitis is a long-term (chronic) skin condition that happens when veins can no longer pump blood back to the heart (poor circulation). This condition causes a red or brown scaly rash or sores (ulcers) from the pooling of blood (stasis). This condition usually affects the lower legs. It may affect one leg or both legs. Without treatment, severe stasis dermatitis can lead to other skin conditions and infections. What are the causes? This condition is caused by poor circulation. What increases the risk? You are more likely to develop this condition if:  You are not very active.  You stand for long periods of time.  You have veins that have become enlarged and twisted (varicose veins).  You have leg veins that are not strong enough to send blood back to the heart (venous insufficiency).  You have had a blood clot.  You have been pregnant many times.  You have had vein surgery.  You are obese.  You have heart or kidney failure.  You are 6 years of age or older.  You have had injuries to your legs in the past. What are the signs or symptoms? Common early symptoms of this condition include:  Itchiness in one or both of your legs.  Swelling in your ankle or leg. This might get better overnight but be worse again during the day.  Skin that looks thin on your ankle and leg.  Red or brown marks that develop slowly.  Skin that is dry, cracked, or easily irritated.  Red, swollen skin that is sore or has a burning feeling.  An achy or heavy feeling after you walk or stand for long periods of time.  Pain. Later and more severe symptoms of this condition include:  Skin that looks shiny.  Small, open sores (ulcers). These are often red or purple and leak fluid.  Skin that feels hard.  Severe itching.  A change in the shape or color of your lower legs.  Severe pain.  Difficulty walking.   How is this diagnosed? This condition may be  diagnosed based on:  Your symptoms and medical history.  A physical exam. You may also have tests, including:  Blood tests.  Imaging tests to check blood flow (Doppler ultrasound).  Allergy tests. You may need to see a health care provider who specializes in skin diseases (dermatologist). How is this treated? This condition may be treated with:  Compression stockings or an elastic wrap to improve circulation.  Medicines, such as: ? Corticosteroid creams and ointments. ? Non-corticosteroid medicines applied to the skin (topical). ? Medicine to reduce swelling in the legs (diuretics). ? Antibiotics. ? Medicine to relieve itching (antihistamines).  A bandage (dressing).  A wrap that contains zinc and gelatin (Unna boot).   Follow these instructions at home: Skin care  Moisturize your skin as told by your health care provider. Do not use moisturizers with fragrance. This can irritate your skin.  Apply a cool, wet cloth (cool compress) to the affected areas.  Do not scratch your skin.  Do not rub your skin dry after a bath or shower. Gently pat your skin dry.  Do not use scented soaps, detergents, or perfumes. Medicines  Take or use over-the-counter and prescription medicines only as told by your health care provider.  If you were prescribed an antibiotic medicine, take or use it as told by your health care provider. Do not stop taking or using the antibiotic even if your condition improves. Activity  Walk as  told by your health care provider. Walking increases blood flow.  Do calf and ankle exercises throughout the day as told by your health care provider. This will help increase blood flow.  Raise (elevate) your legs above the level of your heart when you are sitting or lying down. Lifestyle  Work with your health care provider to lose weight, if needed.  Do not cross your legs when you sit.  Do not stand or sit in one position for long periods of time.  Wear  comfortable, loose-fitting clothing. Circulation in your legs will be worse if you wear tight pants, belts, and waistbands.  Do not use any products that contain nicotine or tobacco, such as cigarettes, e-cigarettes, and chewing tobacco. If you need help quitting, ask your health care provider. General instructions  If you were asked to use one of the following to help with your condition, follow instructions from your health care provider on how to: ? Remove and change any dressing. ? Wear compression stockings. These stockings help to prevent blood clots and reduce swelling in your legs. ? Wear the [employer].  Keep all follow-up visits as told by your health care provider. This is important. Contact a health care provider if:  Your condition does not improve with treatment.  Your condition gets worse.  You have signs of infection in the affected area. Watch for: ? Swelling. ? Tenderness. ? Redness. ? Soreness. ? Warmth.  You have a fever. Get help right away if:  You notice red streaks coming from the affected area.  Your bone or joint underneath the affected area becomes painful after the skin has healed.  The affected area turns darker.  You feel a deep pain in your leg or groin.  You are short of breath. Summary  Stasis dermatitis is a long-term (chronic) skin condition that happens when veins can no longer pump blood back to the heart (poor circulation).  Wear compression stockings as told by your health care provider. These stockings help to prevent blood clots and reduce swelling in your legs.  Follow instructions from your health care provider about activity, medicines, and lifestyle.  Contact a health care provider if you have a fever or have signs of infection in the affected area.  Keep all follow-up visits as told by your health care provider. This is important. This information is not intended to replace advice given to you by your health care provider.  Make sure you discuss any questions you have with your health care provider. Document Revised: 09/05/2017 Document Reviewed: 09/05/2017 Elsevier Patient Education  2021 [employer].   Trigger Finger  Trigger finger, also called stenosing tenosynovitis,  is a condition that causes a finger to get stuck in a bent position. Each finger has a tendon, which is a tough, cord-like tissue that connects muscle to bone, and each tendon passes through a tunnel of tissue called a tendon sheath. To move your finger, your tendon needs to glide freely through the sheath. Trigger finger happens when the tendon or the sheath thickens, making it difficult to move your finger. Trigger finger can affect any finger or a thumb. It may affect more than one finger. Mild cases may clear up with rest and medicine. Severe cases require more treatment. What are the causes? Trigger finger is caused by a thickened finger tendon or tendon sheath. The cause of this thickening is not known. What increases the risk? The following factors may make you more likely to develop this  condition:  Doing activities that require a strong grip.  Having rheumatoid arthritis, gout, or diabetes.  Being 74-46 years old.  Being female. What are the signs or symptoms? Symptoms of this condition include:  Pain when bending or straightening your finger.  Tenderness or swelling where your finger attaches to the palm of your hand.  A lump in the palm of your hand or on the inside of your finger.  Hearing a noise like a pop or a snap when you try to straighten your finger.  Feeling a catching or locking sensation when you try to straighten your finger.  Being unable to straighten your finger. How is this diagnosed? This condition is diagnosed based on your symptoms and a physical exam. How is this treated? This condition may be treated by:  Resting your finger and avoiding activities that make symptoms worse.  Wearing a finger  splint to keep your finger extended.  Taking NSAIDs, such as ibuprofen, to relieve pain and swelling.  Doing gentle exercises to stretch the finger as told by your health care provider.  Having medicine that reduces swelling and inflammation (steroids) injected into the tendon sheath. Injections may need to be repeated.  Having surgery to open the tendon sheath. This may be done if other treatments do not work and you cannot straighten your finger. You may need physical therapy after surgery. Follow these instructions at home: If you have a splint:  Wear the splint as told by your health care provider. Remove it only as told by your health care provider.  Loosen it if your fingers tingle, become numb, or turn cold and blue.  Keep it clean.  If the splint is not waterproof: ? Do not let it get wet. ? Cover it with a watertight covering when you take a bath or shower. Managing pain, stiffness, and swelling If directed, apply heat to the affected area as often as told by your health care provider. Use the heat source that your health care provider recommends, such as a moist heat pack or a heating pad.  Place a towel between your skin and the heat source.  Leave the heat on for 20-30 minutes.  Remove the heat if your skin turns bright red. This is especially important if you are unable to feel pain, heat, or cold. You may have a greater risk of getting burned. If directed, put ice on the painful area. To do this:  If you have a removable splint, remove it as told by your health care provider.  Put ice in a plastic bag.  Place a towel between your skin and the bag or between your splint and the bag.  Leave the ice on for 20 minutes, 2-3 times a day.      Activity  Rest your finger as told by your health care provider. Avoid activities that make the pain worse.  Return to your normal activities as told by your health care provider. Ask your health care provider what activities  are safe for you.  Do exercises as told by your health care provider.  Ask your health care provider when it is safe to drive if you have a splint on your hand. General instructions  Take over-the-counter and prescription medicines only as told by your health care provider.  Keep all follow-up visits as told by your health care provider. This is important. Contact a health care provider if:  Your symptoms are not improving with home care. Summary  Trigger finger, also  called stenosing tenosynovitis, causes your finger to get stuck in a bent position. This can make it difficult and painful to straighten your finger.  This condition develops when a finger tendon or tendon sheath thickens.  Treatment may include resting your finger, wearing a splint, and taking medicines.  In severe cases, surgery to open the tendon sheath may be needed.    Erythrocyte Sedimentation Rate Test Why am I having this test? The erythrocyte sedimentation rate (ESR) test is used to help find illnesses related to:  Sudden (acute) or long-term (chronic) infections.  Inflammation.  The body's disease-fighting system attacking healthy cells (autoimmune diseases).  Cancer.  Tissue death. If you have symptoms that may be related to any of these illnesses, your health care provider may do an ESR test before doing more specific tests. If you have an inflammatory immune disease, such as rheumatoid arthritis, you may have this test to help monitor your therapy. What is being tested? This test measures how long it takes for your red blood cells (erythrocytes) to settle in a solution over a certain amount of time (sedimentation rate). When you have an infection or inflammation, your red blood cells clump together and settle faster. The sedimentation rate provides information about how much inflammation is present in the body. What kind of sample is taken? A blood sample is required for this test. It is usually  collected by inserting a needle into a blood vessel.   How do I prepare for this test? Follow any instructions from your health care provider about changing or stopping your regular medicines. Tell a health care provider about:  Any allergies you have.  All medicines you are taking, including vitamins, herbs, eye drops, creams, and over-the-counter medicines.  Any blood disorders you have.  Any surgeries you have had.  Any medical conditions you have, such as thyroid or kidney disease.  Whether you are pregnant or may be pregnant. How are the results reported? Your results will be reported as a value that measures sedimentation rate in millimeters per hour (mm/hr). Your health care provider will compare your results to normal ranges that were established after testing a large group of people (reference values). Reference values may vary among labs and hospitals. For this test, common reference values, which vary by age and gender, are:  Newborn: 0-2 mm/hr.  Child, up to puberty: 0-10 mm/hr.  Female: ? Under 50 years: 0-20 mm/hr. ? 50-85 years: 0-30 mm/hr. ? Over 85 years: 0-42 mm/hr.  Female: ? Under 50 years: 0-15 mm/hr. ? 50-85 years: 0-20 mm/hr. ? Over 85 years: 0-30 mm/hr. Certain conditions or medicines may cause ESR levels to be falsely lower or higher, such as:  Pregnancy.  Obesity.  Steroids, birth control pills, and blood thinners.  Thyroid or kidney disease. What do the results mean? Results that are within reference values are considered normal, meaning that the level of inflammation in your body is healthy. High ESR levels mean that there is inflammation in your body. You will have more tests to help make a diagnosis. Inflammation may result from many different conditions or injuries. Talk with your health care provider about what your results mean. Questions to ask your health care provider Ask your health care provider, or the department that is doing the  test:  When will my results be ready?  How will I get my results?  What are my treatment options?  What other tests do I need?  What are my next steps? Summary  The erythrocyte sedimentation rate (ESR) test is used to help find illnesses associated with sudden (acute) or long-term (chronic) infections, inflammation, autoimmune diseases, cancer, or tissue death.  If you have symptoms that may be related to any of these illnesses, your health care provider may do an ESR test before doing more specific tests. If you have an inflammatory immune disease, such as rheumatoid arthritis, you may have this test to help monitor your therapy.  This test measures how long it takes for your red blood cells (erythrocytes) to settle in a solution over a certain amount of time (sedimentation rate). This provides information about how much inflammation is present in the body.    Anti-DNA Antibody Test Why am I having this test? The anti-DNA antibody test helps with the diagnosis and follow-up of systemic lupus erythematosus (SLE). It is also used to monitor treatment of this condition as the antibody decreases with successful therapy. What is being tested? This test measures the amount of anti-DNA antibody in the blood. This antibody is found in 65-80% of patients with active SLE. This antibody is not as common in patients who have other diseases. What kind of sample is taken? A blood sample is required for this test. It is usually collected by inserting a needle into a blood vessel.   How are the results reported? Your test results will be reported as a value. Your test results may also be reported as positive, intermediate, or negative. Your health care provider will compare your results to normal ranges that were established after testing a large group of people (reference values). Reference values may vary among labs and hospitals. For this test, common reference values are:  Positive: 10 or more  international units/mL.  Intermediate: 5-9 international units/mL.  Negative: Less than 5 international units/mL. What do the results mean? Positive results, which are associated with results that are higher than the reference values, may indicate:  Autoimmune disorders such as SLE.  Infectious mononucleosis.  Chronic liver conditions. Intermediate results mean that the anti-DNA antibody levels are higher than normal, but not high enough to be considered positive. Negative results mean that you do not have the anti-DNA antibody that is associated with these conditions. Talk with your health care provider about what your results mean. Questions to ask your health care provider Ask your health care provider, or the department that is doing the test:  When will my results be ready?  How will I get my results?  What are my treatment options?  What other tests do I need?  What are my next steps? Summary  The anti-DNA antibody test helps with the diagnosis and follow-up of systemic lupus erythematosus (SLE). It is also used to monitor treatment of this condition as the antibody decreases with successful therapy.  This test measures the amount of anti-DNA antibody in the blood.  Elevated levels of anti-DNA antibody can be seen in patients with SLE and certain other conditions.  Complement Assay Test Why am I having this test? Complement refers to a group of proteins that are part of the body's disease-fighting system (immune system). A complement assay test provides information about some or all of these proteins. You may have this test:  To diagnose a lack, or deficiency, of certain complement proteins. Deficiencies can be passed from parent to child (inherited).  To monitor an infection or autoimmune disease.  If you have unexplained inflammation or swelling (edema).  If you have bacterial infections again and again. What is  being tested? This test can be used to  measure:  Total complement. This is the total number of protein complements in your blood.  The number of each kind of complement in your blood. The nine main kinds of complement are labeled C1 through C9. Some of these complements, such as C3 and C4, are especially important and have many functions in the body. Depending on why you are having the test, your health care provider may test your total complement or only some individual complements, such as C3 and C4. The total complement assay test may be done before individual complements are tested. What kind of sample is taken? A blood sample is required for this test. It is usually collected by inserting a needle into a blood vessel.   Tell a health care provider about:  Any allergies you have.  All medicines you are taking, including vitamins, herbs, eye drops, creams, and over-the-counter medicines.  Any blood disorders you have.  Any surgeries you have had.  Any medical conditions you have.  Whether you are pregnant or may be pregnant. How are the results reported? Your results will be reported as a value that tells you how much complement is in your blood. This will be given as units per milliliter of blood (units/mL) or as milligrams per deciliter of blood (mg/dL). Your results may be reported as total complement, or as individual complements, or both. Your health care provider will compare your results to normal ranges that were established after testing a large group of people (reference ranges). Reference ranges may vary among labs and hospitals. For this test, reference ranges for some of the most commonly measured complement assays may be:  Total complement: 30-75 units/mL.  C2: 1-4 mg/dL.  C3: 75-175 mg/dL.  C4: 22-45 units/mL. What do the results mean? Results within reference ranges are considered normal, which means you have a normal amount of complement in your blood. Results that are higher than the reference ranges  may be caused by:  Inflammatory disease.  Heart attack.  Cancer. Complement deficiencies, or results lower than the reference ranges, may be caused by:  Certain inherited conditions.  Autoimmune disease.  Certain liver diseases.  Malnutrition.  Certain types of anemia that result in breakdown of red blood cells (hemolytic anemia). Talk with your health care provider about what your results mean. Questions to ask your health care provider Ask your health care provider, or the department that is doing the test:  When will my results be ready?  How will I get my results?  What are my treatment options?  What other tests do I need?  What are my next steps? Summary  Complement refers to a group of proteins that are part of the body's disease-fighting system (immune system). A complement assay test can provide information about some or all of these proteins.  You may have a complement assay test to help diagnose a complement deficiency, and to monitor some infections or autoimmune disease.  Talk with your health care provider about what your results mean.    Anticyclic-Citrullinated Peptide Antibody Test Why am I having this test? You may have the anticyclic-citrullinated peptide antibody test done to help:  Diagnose rheumatoid arthritis (RA). RA is a long-term (chronic) disease that causes inflammation in the joints.  Determine the severity of your RA, including how much worse it is getting (progression). This test may be done if you have unexplained joint inflammation and have previously tested negative for rheumatoid factor. It may  also be done if you have been diagnosed with undifferentiated arthritis and your health care provider suspects rheumatoid arthritis. What is being tested? This test checks your blood for the presence of anticyclic-citrullinated peptide antibodies. Antibodies are cells that are part of the body's disease-fighting (immune) system. These  antibodies appear early in the course of RA and are thought to be directly involved in the progression of the disease. What kind of sample is taken? A blood sample is required for this test. It is usually collected by inserting a needle into a blood vessel.   How are the results reported? Your test results will be reported as either positive or negative. A result is considered negative if there is less than 20 units of the antibody per mL of blood. What do the results mean?  A positive blood test may mean that you have RA.  A negative blood test means that it is less likely that you have RA. However, a negative test does not completely rule out rheumatoid arthritis. Talk with your health care provider about what your results mean. Questions to ask your health care provider Ask your health care provider, or the department that is doing the test:  When will my results be ready?  How will I get my results?  What are my treatment options?  What other tests do I need?  What are my next steps? Summary  The anticyclic-citrullinated peptide antibody blood test may be done to help your health care provider diagnose rheumatoid arthritis (RA).  This test checks your blood for the presence of anticyclic-citrullinated peptide antibodies. These antibodies appear early in the course of RA.  A positive blood test may mean that you have RA. This information is not intended to replace advice given to you by your health care provider. Make sure you discuss any questions you have with your health care provider. Document Revised: 07/25/2019 Document Reviewed: 07/25/2019 Elsevier Patient Education  2021 [employer].

## 2020-07-31 LAB — RHEUMATOID FACTOR: Rheumatoid fact SerPl-aCnc: <14 IU/mL (ref <14)

## 2020-07-31 LAB — SJOGRENS SYNDROME-B EXTRACTABLE NUCLEAR ANTIBODY: SSB (La) (ENA) Antibody, IgG: <1 AI

## 2020-07-31 LAB — CYCLIC CITRUL PEPTIDE ANTIBODY, IGG: Cyclic Citrullin Peptide Ab: <16 UNITS

## 2020-07-31 LAB — C3 AND C4
C3 Complement: 113 mg/dL
C4 Complement: 40 mg/dL

## 2020-07-31 LAB — ANTI-SMITH ANTIBODY: ENA SM Ab Ser-aCnc: <1 AI

## 2020-07-31 LAB — SJOGRENS SYNDROME-A EXTRACTABLE NUCLEAR ANTIBODY: SSA (Ro) (ENA) Antibody, IgG: <1 AI

## 2020-07-31 LAB — SEDIMENTATION RATE: Sed Rate: 29 mm/h (ref 0–30)

## 2020-07-31 LAB — ANTI-DNA ANTIBODY, DOUBLE-STRANDED: ds DNA Ab: 4 IU/mL

## 2020-08-12 NOTE — Progress Notes (Signed)
Office Visit Note  Patient: Rebekah Bush             Date of Birth: 1929/12/30           MRN: [id number]             PCP: Roetta Sessions, NP Referring: Roetta Sessions* Visit Date: 08/13/2020   Subjective:  Follow-up (Patient denies changes in symptoms since last visit. Patient has continued bilateral hand pain (right>left). )   History of Present Illness: Rebekah Bush is a 86 y.o. female here for follow up for bilateral hand arthritis.  Her right hip pain remains improved since the last visit.  Laboratory work-up was negative for any specific serology related to the positive ANA.  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  She says symptoms are about the same still having pain in both hands and bothersome triggering and catching with fourth and fifth digit of her right hand.    Review of Systems  Constitutional: Negative for fatigue.  HENT: Negative for mouth sores, mouth dryness and nose dryness.   Eyes: Negative for pain, itching, visual disturbance and dryness.  Respiratory: Negative for cough, hemoptysis, shortness of breath and difficulty breathing.   Cardiovascular: Positive for swelling in legs/feet. Negative for chest pain and palpitations.  Gastrointestinal: Negative for abdominal pain, blood in stool, constipation and diarrhea.  Endocrine: Negative for increased urination.  Genitourinary: Negative for painful urination.  Musculoskeletal: Positive for arthralgias, joint pain, joint swelling and morning stiffness. Negative for myalgias, muscle weakness, muscle tenderness and myalgias.  Skin: Positive for redness. Negative for color change and rash.  Allergic/Immunologic: Negative for susceptible to infections.  Neurological: Negative for dizziness, numbness, headaches, memory loss and weakness.  Hematological: Negative for swollen glands.  Psychiatric/Behavioral: Negative for confusion and  sleep disturbance.    PMFS History:  Patient Active Problem List   Diagnosis Date Noted  . Generalized osteoarthritis 07/30/2020  . Positive ANA (antinuclear antibody) 07/30/2020  . Trigger finger, right ring finger 07/30/2020  . Bilateral hand pain 07/30/2020  . Pain in right hip 07/30/2020  . Rash and other nonspecific skin eruption 07/30/2020  . Advanced nonexudative age-related macular degeneration of right eye with subfoveal involvement 08/15/2019  . Intermediate stage nonexudative age-related macular degeneration of left eye 08/01/2019  . Right epiretinal membrane 08/01/2019  . Shoulder dislocation, right, initial encounter 05/08/2019  . Fall at home, initial encounter 05/07/2019  . Closed fracture of superior ramus of pubis, initial encounter ([place]) 05/07/2019  . Hypothyroidism 05/07/2019  . Closed dislocation of right shoulder 05/07/2019  . Prolonged QT interval 05/07/2019    Past Medical History:  Diagnosis Date  . Basal cell carcinoma 08/29/2019   sclerosis on right temple  . Gout   . Hypertension   . Hyponatremia   . Retinal detachment    [facility]  . Squamous cell carcinoma of skin 08/29/2019   in situ on left buccal cheek  . Squamous cell carcinoma of skin 08/29/2019   in situ on left upper arm, posterior  . Squamous cell carcinoma of skin 08/29/2019   in situ on left forearm, posterior    Family History  Problem Relation Age of Onset  . Ovarian cancer Mother   . Pulmonary embolism Father   . Hypertension Son    Past Surgical History:  Procedure Laterality Date  . CATARACT EXTRACTION Right    Rebekah Bush - 1980s   .  CATARACT EXTRACTION Left    Rebekah Bush - 1980s  . CHOLECYSTECTOMY    . RETINAL DETACHMENT SURGERY     Social History   Social History Narrative  . Not on file   Immunization History  Administered Date(s) Administered  . Moderna Sars-Covid-2 Vaccination 06/07/2019, 07/05/2019, 02/21/2020     Objective: Vital Signs: BP (!)  160/78 (BP Location: Left Arm, Patient Position: Sitting, Cuff Size: Normal)   Pulse (!) 52   Ht 5\' 1"  (1.549 m)   Wt 119 lb 3.2 oz (54.1 kg)   BMI 22.52 kg/m    Physical Exam Skin:    General: Skin is warm and dry.     Findings: No rash.     Comments: Senile purpura  Neurological:     General: No focal deficit present.     Mental Status: She is alert.  Psychiatric:        Mood and Affect: Mood normal.    Musculoskeletal Exam:  Wrists right side prominent ulnar head slightly decreased wrist range of motion in flexion and extension probable chronic soft tissue swelling on the ulnar aspect of hand, no friction rub detectable Fingers full ROM partial subluxation of second third MCP and ulnar deviation of second through fourth digits on the right hand, palpable nodule over fourth digit flexor tendon around the A1 pulley   Investigation: No additional findings.  Imaging: XR Hand 2 View Left  Result Date: 07/30/2020 Xray left hand 2 views Radiocarpal joint space complete erosion or absence of ulnar styloid, calcification of TFCC present. Numerous cystic changes in carpal bones. Advanced degenerative arthritis of 1st CMC with partial subluxation. MCP joint narrowing and slight subluxation in 2-3rd digits. PIP joint space slightly reduced severe degenerative change in DIP joints. No erosions seen. Bone mineralization shows generalized osteopenia. Impression Advanced osteoarthritis of the 1st CMC joint and DIP joints. Cystic changes of wrist, ulnar styloid absent and MCP subluxation cannot exclude crystalline arthropathy or previous inflammatory disease.  XR Hand 2 View Right  Result Date: 07/30/2020 Xray right hand 2 views Reduced radiocarpal joint space and ulnar styloid is eroded or otherwise absent. Mild calcification of TFCC present. Very large cystic appearing changes in carpal bones and base of 1st metacarpal. Moderately advanced 1st CMC joint degenerative arthritis with partial  subluxation and 1st MCP joint hyperextension. MCP narrowing and subluxation in 2nd-3rd digits with probable flexor tendon calcifications seen at 2nd MCP. PIP and DIP joints asymmetric narrowing with some joint deviation and osteophytes also some periarticualr or tendon calcifications seen. No periarticular erosions seen. Bone mineralization shows generalized osteopenia. Impression Advanced osteoarthritis throughout. Ulnar styloid absence, MCP involvement with subluxation cannot exclude crystalline arthropathy or previous inflammatory process   Recent Labs: Lab Results  Component Value Date   WBC 13.6 (H) 05/08/2019   HGB 13.5 05/08/2019   PLT 199 05/08/2019   NA 139 05/08/2019   K 3.7 05/08/2019   CL 108 05/08/2019   CO2 24 05/08/2019   GLUCOSE 100 (H) 05/08/2019   BUN 27 (H) 05/08/2019   CREATININE 0.66 05/08/2019   BILITOT 1.5 (H) 05/07/2019   ALKPHOS 67 05/07/2019   AST 94 (H) 05/07/2019   ALT 40 05/07/2019   PROT 6.5 05/07/2019   ALBUMIN 3.6 05/07/2019   CALCIUM 8.6 (L) 05/08/2019   GFRAA >60 05/08/2019    Speciality Comments: No specialty comments available.  Procedures:  Hand/UE Inj: R ring A1 for trigger finger on 08/13/2020 1:45 PM Indications: tendon swelling and therapeutic Details: 25 G  needle, volar approach Medications: 0.5 mL lidocaine 1 %; 20 mg triamcinolone acetonide 40 MG/ML Procedure, treatment alternatives, risks and benefits explained, specific risks discussed. Consent was given by the patient. Immediately prior to procedure a time out was called to verify the correct patient, procedure, equipment, support staff and site/side marked as required. Patient was prepped and draped in the usual sterile fashion.     Allergies: Sulfa antibiotics and Bactrim [sulfamethoxazole-trimethoprim]   Assessment / Plan:     Visit Diagnoses: Bilateral hand pain  I suspect current hand pain is due to osteoarthritis but there is clinical and radiographic evidence the pattern  of damage is suggestive for calcium pyrophosphate deposition disease.  He currently using NSAIDs with reasonable control of her symptoms which is also appropriate for this problem.  We discussed if she notices a flareup of swelling redness or acute increase in joint pains we can take another look at that time.  Trigger finger, right ring finger  Suspect triggering in the right hand related to flexor tendon nodule though cannot exclude a problem with the extensor tendons given the high riding ulnar head and degree of deviation in these finger joints.  If she does not get any benefit from the injection treatment would consider referral to hand surgeon if functional conditions worsening.  Positive ANA (antinuclear antibody)  Positive ANA no clinical criteria for systemic lupus are seen and more specific antibody profile is all negative.  Based on this I do not suspect a systemic connective tissue disease no additional work-up recommended.  Orders: Orders Placed This Encounter  Procedures  . Hand/UE Inj   No orders of the defined types were placed in this encounter.    Follow-Up Instructions: Return if symptoms worsen or fail to improve.   Collier Salina, MD  Note - This record has been created using [employer].  Chart creation errors have been sought, but may not always  have been located. Such creation errors do not reflect on  the standard of medical care.

## 2020-08-13 ENCOUNTER — Other Ambulatory Visit: Payer: Self-pay

## 2020-08-13 ENCOUNTER — Ambulatory Visit (INDEPENDENT_AMBULATORY_CARE_PROVIDER_SITE_OTHER): Payer: Medicare Other | Admitting: Internal Medicine

## 2020-08-13 ENCOUNTER — Encounter: Payer: Self-pay | Admitting: Internal Medicine

## 2020-08-13 VITALS — BP 160/78 | HR 52 | Ht 61.0 in | Wt 119.2 lb

## 2020-08-13 DIAGNOSIS — M65341 Trigger finger, right ring finger: Secondary | ICD-10-CM

## 2020-08-13 DIAGNOSIS — M79642 Pain in left hand: Secondary | ICD-10-CM | POA: Diagnosis not present

## 2020-08-13 DIAGNOSIS — R768 Other specified abnormal immunological findings in serum: Secondary | ICD-10-CM

## 2020-08-13 DIAGNOSIS — M79641 Pain in right hand: Secondary | ICD-10-CM

## 2020-08-13 MED ORDER — LIDOCAINE HCL 1 % IJ SOLN
0.5000 mL | INTRAMUSCULAR | Status: AC | PRN
  Administered 2020-08-13: .5 mL

## 2020-08-13 MED ORDER — TRIAMCINOLONE ACETONIDE 40 MG/ML IJ SUSP
20.0000 mg | INTRAMUSCULAR | Status: AC | PRN
Start: 2020-08-13 — End: 2020-08-13
  Administered 2020-08-13: 20 mg

## 2020-08-13 NOTE — Patient Instructions (Addendum)
Joint Steroid Injection A joint steroid injection is a procedure to relieve swelling and pain in a joint. Steroids are medicines that reduce inflammation. In this procedure, your health care provider uses a syringe and a needle to inject a steroid medicine into a painful and inflamed joint. A pain-relieving medicine (anesthetic) may be injected along with the steroid. In some cases, your health care provider may use an imaging technique such as ultrasound or fluoroscopy to guide the injection. Joints that are often treated with steroid injections include the knee, shoulder, hip, and spine. These injections may also be used in the elbow, ankle, and joints of the hands or feet. You may have joint steroid injections as part of your treatment for inflammation caused by:  Gout.  Rheumatoid arthritis.  Advanced wear-and-tear arthritis (osteoarthritis).  Tendinitis.  Bursitis. Joint steroid injections may be repeated, but having them too often can damage a joint or the skin over the joint. You should not have joint steroid injections less than 6 weeks apart or more than four times a year. Tell a health care provider about:  Any allergies you have.  All medicines you are taking, including vitamins, herbs, eye drops, creams, and over-the-counter medicines.  Any problems you or family members have had with anesthetic medicines.  Any blood disorders you have.  Any surgeries you have had.  Any medical conditions you have.  Whether you are pregnant or may be pregnant. What are the risks? Generally, this is a safe treatment. However, problems may occur, including:  Infection.  Bleeding.  Allergic reactions to medicines.  Damage to the joint or tissues around the joint.  Thinning of skin or loss of skin color over the joint.  Temporary flushing of the face or chest.  Temporary increase in pain.  Temporary increase in blood sugar.  Failure to relieve inflammation or pain. What  happens before the treatment? Medicines Ask your health care provider about:  Changing or stopping your regular medicines. This is especially important if you are taking diabetes medicines or blood thinners.  Taking medicines such as aspirin and ibuprofen. These medicines can thin your blood. Do not take these medicines unless your health care provider tells you to take them.  Taking over-the-counter medicines, vitamins, herbs, and supplements. General instructions  You may have imaging tests of your joint.  Ask your health care provider if you can drive yourself home after the procedure. What happens during the treatment?  Your health care provider will position you for the injection and locate the injection site over your joint.  The skin over the joint will be cleaned with a germ-killing soap.  Your health care provider may: ? Spray a numbing solution (topical anesthetic) over the injection site. ? Inject a local anesthetic under the skin above your joint.  The needle will be placed through your skin into your joint. Your health care provider may use imaging to guide the needle to the right spot for the injection. If imaging is used, a special contrast dye may be injected to confirm that the needle is in the correct location.  The steroid medicine will be injected into your joint.  Anesthetic may be injected along with the steroid. This may be a medicine that relieves pain for a short time (short-acting anesthetic) or for a longer time (long-acting anesthetic).  The needle will be removed, and an adhesive bandage (dressing) will be placed over the injection site. The procedure may vary among health care providers and hospitals.  What can I expect after the treatment?  You will be able to go home after the treatment.  It is normal to feel slight flushing for a few days after the injection.  After the treatment, it is common to have an increase in joint pain after the  anesthetic has worn off. This may happen about an hour after a short-acting anesthetic or about 8 hours after a longer-acting anesthetic.  You should begin to feel relief from joint pain and swelling after 24 to 48 hours. Contact your health care provider if you do not begin to feel relief after 2 days. Follow these instructions at home: Injection site care  Leave the adhesive dressing over your injection site in place until your health care provider says you can remove it.  Check your injection site every day for signs of infection. Check for: ? More redness, swelling, or pain. ? Fluid or blood. ? Warmth. ? Pus or a bad smell. Activity  Return to your normal activities as told by your health care provider. Ask your health care provider what activities are safe for you. You may be asked to limit activities that put stress on the joint for a few days.  Do joint exercises as told by your health care provider.  Do not take baths, swim, or use a hot tub until your health care provider approves. Ask your health care provider if you may take showers. You may only be allowed to take sponge baths. Managing pain, stiffness, and swelling  If directed, put ice on the joint. To do this: ? Put ice in a plastic bag. ? Place a towel between your skin and the bag. ? Leave the ice on for 20 minutes, 2-3 times a day. ? Remove the ice if your skin turns bright red. This is very important. If you cannot feel pain, heat, or cold, you have a greater risk of damage to the area.  Raise (elevate) your joint above the level of your heart when you are sitting or lying down.   General instructions  Take over-the-counter and prescription medicines only as told by your health care provider.  Do not use any products that contain nicotine or tobacco, such as cigarettes, e-cigarettes, and chewing tobacco. These can delay joint healing. If you need help quitting, ask your health care provider.  If you have  diabetes, be aware that your blood sugar may be slightly elevated for several days after the injection.  Keep all follow-up visits. This is important. Contact a health care provider if you have:  Chills or a fever.  Any signs of infection at your injection site.  Increased pain or swelling or no relief after 2 days. Summary  A joint steroid injection is a treatment to relieve pain and swelling in a joint.  Steroids are medicines that reduce inflammation. Your health care provider may add an anesthetic along with the steroid.  You may have joint steroid injections as part of your arthritis treatment.  Joint steroid injections may be repeated, but having them too often can damage a joint or the skin over the joint.  Contact your health care provider if you have a fever, chills, or signs of infection, or if you get no relief from joint pain or swelling.  Calcium Pyrophosphate Deposition Calcium pyrophosphate deposition (CPPD) is a type of arthritis that causes pain, swelling, and inflammation in a joint. Attacks of CPPD may come and go. The joint pain can be severe and may last for  days to weeks. This condition usually affects one joint at a time. The knees are most often affected, but this condition can also affect the wrists, elbows, shoulders, or ankles. CPPD may also be called pseudogout because it is similar to gout. Both conditions result from the buildup of crystals in a joint. However, CPPD is caused by a type of crystal that is different from the crystals that cause gout. What are the causes? This condition is caused by the buildup of calcium pyrophosphate dihydrate crystals in a joint. The reason why this buildup occurs is not known. An increased likelihood of having this condition (predisposition) may be passed from parent to child (is hereditary). What increases the risk? This condition is more likely to develop in people who:  Are older than 85 years of age.  Have a family  history of CPPD.  Have certain medical conditions, such as hemophilia, amyloidosis, or overactive parathyroid glands.  Have low levels of magnesium in the blood. What are the signs or symptoms? Symptoms of this condition include:  Joint pain. The pain may: ? Be intense and constant. ? Develop quickly. ? Get worse with movement. ? Last from several days to a few weeks.  Redness, swelling, stiffness, and warmth at the joint.   How is this diagnosed? To diagnose this condition, your health care provider will use a needle to remove fluid from the joint. The fluid will be examined for the crystals that cause CPPD. You also may have additional tests, such as:  Blood tests.  X-rays.  Ultrasound.  MRI. How is this treated? There is no way to remove the crystals from the joint and no cure for this condition. However, treatment can relieve symptoms and improve joint function. Treatment may include:  NSAIDs to reduce inflammation and pain.  Removing some of the fluid and crystals from around the joint with a needle.  Injections of medicine (cortisone) into the joint to reduce pain and swelling.  Medicines to help prevent attacks.  Physical therapy to improve joint function. Follow these instructions at home: Managing pain, stiffness, and swelling  Rest the affected joint until your symptoms start to go away.  If directed, put ice on the affected area to relieve pain and swelling: ? Put ice in a plastic bag. ? Place a towel between your skin and the bag. ? Leave the ice on for 20 minutes, 2-3 times a day.  Keep your affected joint raised (elevated) above the level of your heart, when possible. This will help to reduce swelling. For example, prop your foot up on a chair while sitting down to elevate your knee.   General instructions  If the painful joint is in your leg, use crutches as told by your health care provider.  Take over-the-counter and prescription medicines only as  told by your health care provider.  When your symptoms start to go away, begin to exercise regularly or do physical therapy. Talk with your health care provider or physical therapist about what types of exercise are safe for you. Exercise that is easier on your joints (low-impact exercise) may be best. This includes walking, swimming, bicycling, and water aerobics.  Maintain a healthy weight. Excess weight puts stress on your joints.  Keep all follow-up visits as told by your health care provider and physical therapist. This is important. Contact a health care provider if you:  Notice that your symptoms get worse.  Develop a skin rash.  Notice that your pain gets worse. Get help  right away if you:  Have a fever.  Have difficulty breathing.  Are taking NSAIDs and you: ? Vomit blood. ? Have blood in your stool. ? Have stool that is tarry and black. Summary  Calcium pyrophosphate deposition (CPPD) is a type of arthritis that causes pain, swelling, and inflammation in a joint. The knees are most often affected, but it can also affect the wrists, elbows, shoulders, or ankles.  CPPD is caused by the buildup of calcium crystals in a joint. The reason why this occurs is not known.  Attacks of CPPD may come and go. The joint pain can be severe and may last for days to weeks.  There is no way to remove the crystals from the joint and no cure for this condition. However, treatment can relieve symptoms and improve joint function.  Rest the affected joint until your symptoms start to go away. After your symptoms go away, begin to exercise regularly or do physical therapy.

## 2020-08-21 ENCOUNTER — Telehealth: Payer: Self-pay

## 2020-08-21 MED ORDER — COLCHICINE 0.6 MG PO CAPS: 0.6000 mg | ORAL_CAPSULE | Freq: Every day | ORAL | 0 refills | Status: DC

## 2020-08-21 NOTE — Telephone Encounter (Signed)
Please advise 

## 2020-08-21 NOTE — Telephone Encounter (Signed)
Patient called stating she had a steroid injection in her right hand ringer finger and is still experiencing pain.  Patient states she tried to give it some time before calling, but is not sure that it worked.  Patient requested a return call.

## 2020-08-21 NOTE — Telephone Encounter (Signed)
FYI- I spoke with Ms. 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 Rx sent to [facility] on [address] she plans to contact clinic again after seeing how symptoms do starting this for next 1-2 weeks.

## 2020-10-21 ENCOUNTER — Telehealth: Payer: Self-pay

## 2020-10-21 DIAGNOSIS — M159 Polyosteoarthritis, unspecified: Secondary | ICD-10-CM

## 2020-10-21 DIAGNOSIS — M65341 Trigger finger, right ring finger: Secondary | ICD-10-CM

## 2020-10-21 DIAGNOSIS — M79641 Pain in right hand: Secondary | ICD-10-CM

## 2020-10-21 NOTE — Telephone Encounter (Signed)
Patient called stating she would like a referral to see a hand specialist.  Patient states she doesn't know anyone and wanted Dr. Marveen Reeks opinion on who he would recommend.  Patient requested a return call.

## 2020-10-21 NOTE — Telephone Encounter (Signed)
I recommend Dr. Amedeo Plenty as a hand specialist. This would be in regards to flexor tendon nodule of hands that I injected previously. She also has problems with osteoarthritis and CPDD.

## 2020-10-21 NOTE — Telephone Encounter (Signed)
Referral has been placed for Dr. Amedeo Plenty at [facility].  Spoke with patient, made aware of referral.

## 2021-01-01 DIAGNOSIS — M19049 Primary osteoarthritis, unspecified hand: Secondary | ICD-10-CM | POA: Insufficient documentation

## 2021-01-28 ENCOUNTER — Encounter (HOSPITAL_COMMUNITY): Payer: Self-pay

## 2021-01-28 ENCOUNTER — Emergency Department (HOSPITAL_COMMUNITY): Payer: Medicare Other | Admitting: Certified Registered Nurse Anesthetist

## 2021-01-28 ENCOUNTER — Emergency Department (HOSPITAL_COMMUNITY): Payer: Medicare Other

## 2021-01-28 ENCOUNTER — Inpatient Hospital Stay (HOSPITAL_COMMUNITY): Payer: Medicare Other

## 2021-01-28 ENCOUNTER — Inpatient Hospital Stay (HOSPITAL_COMMUNITY)
Admission: EM | Admit: 2021-01-28 | Discharge: 2021-02-02 | DRG: 351 | Disposition: A | Discharge status: Home Health | Payer: Medicare Other | Attending: Surgery | Admitting: Surgery

## 2021-01-28 ENCOUNTER — Encounter: Payer: Self-pay | Admitting: Surgery

## 2021-01-28 ENCOUNTER — Encounter (HOSPITAL_COMMUNITY): Admission: EM | Disposition: A | Discharge status: Home Health | Payer: Self-pay | Source: Home / Self Care

## 2021-01-28 DIAGNOSIS — Z8249 Family history of ischemic heart disease and other diseases of the circulatory system: Secondary | ICD-10-CM | POA: Diagnosis not present

## 2021-01-28 DIAGNOSIS — E039 Hypothyroidism, unspecified: Secondary | ICD-10-CM | POA: Diagnosis present

## 2021-01-28 DIAGNOSIS — R059 Cough, unspecified: Secondary | ICD-10-CM | POA: Diagnosis not present

## 2021-01-28 DIAGNOSIS — M109 Gout, unspecified: Secondary | ICD-10-CM | POA: Diagnosis present

## 2021-01-28 DIAGNOSIS — Z85828 Personal history of other malignant neoplasm of skin: Secondary | ICD-10-CM | POA: Diagnosis not present

## 2021-01-28 DIAGNOSIS — R9431 Abnormal electrocardiogram [ECG] [EKG]: Secondary | ICD-10-CM | POA: Diagnosis present

## 2021-01-28 DIAGNOSIS — K4020 Bilateral inguinal hernia, without obstruction or gangrene, not specified as recurrent: Secondary | ICD-10-CM | POA: Diagnosis present

## 2021-01-28 DIAGNOSIS — Z8041 Family history of malignant neoplasm of ovary: Secondary | ICD-10-CM

## 2021-01-28 DIAGNOSIS — Z79890 Hormone replacement therapy (postmenopausal): Secondary | ICD-10-CM | POA: Diagnosis not present

## 2021-01-28 DIAGNOSIS — Z20822 Contact with and (suspected) exposure to covid-19: Secondary | ICD-10-CM | POA: Diagnosis present

## 2021-01-28 DIAGNOSIS — R768 Other specified abnormal immunological findings in serum: Secondary | ICD-10-CM | POA: Diagnosis present

## 2021-01-28 DIAGNOSIS — K4130 Unilateral femoral hernia, with obstruction, without gangrene, not specified as recurrent: Principal | ICD-10-CM

## 2021-01-28 DIAGNOSIS — F03A0 Unspecified dementia, mild, without behavioral disturbance, psychotic disturbance, mood disturbance, and anxiety: Secondary | ICD-10-CM | POA: Diagnosis present

## 2021-01-28 DIAGNOSIS — Z87891 Personal history of nicotine dependence: Secondary | ICD-10-CM | POA: Diagnosis not present

## 2021-01-28 DIAGNOSIS — Z79899 Other long term (current) drug therapy: Secondary | ICD-10-CM | POA: Diagnosis not present

## 2021-01-28 DIAGNOSIS — K56609 Unspecified intestinal obstruction, unspecified as to partial versus complete obstruction: Secondary | ICD-10-CM

## 2021-01-28 DIAGNOSIS — Z882 Allergy status to sulfonamides status: Secondary | ICD-10-CM | POA: Diagnosis not present

## 2021-01-28 DIAGNOSIS — E440 Moderate protein-calorie malnutrition: Secondary | ICD-10-CM | POA: Insufficient documentation

## 2021-01-28 DIAGNOSIS — I10 Essential (primary) hypertension: Secondary | ICD-10-CM | POA: Diagnosis present

## 2021-01-28 DIAGNOSIS — R7689 Other specified abnormal immunological findings in serum: Secondary | ICD-10-CM | POA: Diagnosis present

## 2021-01-28 DIAGNOSIS — Z6821 Body mass index (BMI) 21.0-21.9, adult: Secondary | ICD-10-CM | POA: Diagnosis not present

## 2021-01-28 DIAGNOSIS — Z978 Presence of other specified devices: Secondary | ICD-10-CM

## 2021-01-28 DIAGNOSIS — R051 Acute cough: Secondary | ICD-10-CM | POA: Diagnosis not present

## 2021-01-28 DIAGNOSIS — K469 Unspecified abdominal hernia without obstruction or gangrene: Secondary | ICD-10-CM | POA: Diagnosis present

## 2021-01-28 DIAGNOSIS — K460 Unspecified abdominal hernia with obstruction, without gangrene: Secondary | ICD-10-CM

## 2021-01-28 DIAGNOSIS — Z8781 Personal history of (healed) traumatic fracture: Secondary | ICD-10-CM

## 2021-01-28 HISTORY — PX: BOWEL RESECTION: SHX1257

## 2021-01-28 HISTORY — PX: INGUINAL HERNIA REPAIR: SHX194

## 2021-01-28 LAB — CBC WITH DIFFERENTIAL/PLATELET
Abs Immature Granulocytes: 0.07 10*3/uL (ref 0.00–0.07)
Basophils Absolute: 0 10*3/uL (ref 0.0–0.1)
Basophils Relative: 0 %
Eosinophils Absolute: 0.1 10*3/uL (ref 0.0–0.5)
Eosinophils Relative: 0 %
HCT: 47.7 % — ABNORMAL HIGH (ref 36.0–46.0)
Hemoglobin: 15.6 g/dL — ABNORMAL HIGH (ref 12.0–15.0)
Immature Granulocytes: 1 %
Lymphocytes Relative: 6 %
Lymphs Abs: 0.8 10*3/uL (ref 0.7–4.0)
MCH: 29.5 pg (ref 26.0–34.0)
MCHC: 32.7 g/dL (ref 30.0–36.0)
MCV: 90.3 fL (ref 80.0–100.0)
Monocytes Absolute: 1.3 10*3/uL — ABNORMAL HIGH (ref 0.1–1.0)
Monocytes Relative: 9 %
Neutro Abs: 12.9 10*3/uL — ABNORMAL HIGH (ref 1.7–7.7)
Neutrophils Relative %: 84 %
Platelets: 327 10*3/uL (ref 150–400)
RBC: 5.28 MIL/uL — ABNORMAL HIGH (ref 3.87–5.11)
RDW: 14.2 % (ref 11.5–15.5)
WBC: 15.2 10*3/uL — ABNORMAL HIGH (ref 4.0–10.5)
nRBC: 0 % (ref 0.0–0.2)

## 2021-01-28 LAB — URINALYSIS, ROUTINE W REFLEX MICROSCOPIC
Bilirubin Urine: NEGATIVE
Glucose, UA: NEGATIVE mg/dL
Ketones, ur: 20 mg/dL — AB
Nitrite: NEGATIVE
Protein, ur: 30 mg/dL — AB
Specific Gravity, Urine: 1.017 (ref 1.005–1.030)
pH: 6 (ref 5.0–8.0)

## 2021-01-28 LAB — COMPREHENSIVE METABOLIC PANEL
ALT: 14 U/L (ref 0–44)
AST: 20 U/L (ref 15–41)
Albumin: 3.6 g/dL (ref 3.5–5.0)
Alkaline Phosphatase: 79 U/L (ref 38–126)
Anion gap: 11 (ref 5–15)
BUN: 16 mg/dL (ref 8–23)
CO2: 29 mmol/L (ref 22–32)
Calcium: 9.9 mg/dL (ref 8.9–10.3)
Chloride: 102 mmol/L (ref 98–111)
Creatinine, Ser: 0.81 mg/dL (ref 0.44–1.00)
GFR, Estimated: >60 mL/min (ref >60)
Glucose, Bld: 104 mg/dL — ABNORMAL HIGH (ref 70–99)
Potassium: 3.5 mmol/L (ref 3.5–5.1)
Sodium: 142 mmol/L (ref 135–145)
Total Bilirubin: 0.9 mg/dL (ref 0.3–1.2)
Total Protein: 7 g/dL (ref 6.5–8.1)

## 2021-01-28 LAB — RESP PANEL BY RT-PCR (FLU A&B, COVID) ARPGX2
Influenza A by PCR: NEGATIVE
Influenza B by PCR: NEGATIVE
SARS Coronavirus 2 by RT PCR: NEGATIVE

## 2021-01-28 LAB — LIPASE, BLOOD: Lipase: 25 U/L (ref 11–51)

## 2021-01-28 SURGERY — REPAIR, HERNIA, INGUINAL, LAPAROSCOPIC
Anesthesia: General | Site: Abdomen | Laterality: Right

## 2021-01-28 MED ORDER — ROCURONIUM BROMIDE 100 MG/10ML IV SOLN
INTRAVENOUS | Status: DC | PRN
  Administered 2021-01-28: 30 mg via INTRAVENOUS

## 2021-01-28 MED ORDER — PROPOFOL 10 MG/ML IV BOLUS
INTRAVENOUS | Status: DC | PRN
Start: 2021-01-28 — End: 2021-01-28
  Administered 2021-01-28: 70 mg via INTRAVENOUS

## 2021-01-28 MED ORDER — PHENYLEPHRINE HCL (PRESSORS) 10 MG/ML IV SOLN
INTRAVENOUS | Status: AC
  Filled 2021-01-28: qty 2

## 2021-01-28 MED ORDER — BUPIVACAINE LIPOSOME 1.3 % IJ SUSP: 20.0000 mL | Freq: Once | INTRAMUSCULAR | Status: DC

## 2021-01-28 MED ORDER — BUPIVACAINE-EPINEPHRINE 0.25% -1:200000 IJ SOLN
INTRAMUSCULAR | Status: DC | PRN
  Administered 2021-01-28: 60 mL

## 2021-01-28 MED ORDER — ACETAMINOPHEN 10 MG/ML IV SOLN
INTRAVENOUS | Status: AC
  Filled 2021-01-28: qty 100

## 2021-01-28 MED ORDER — METRONIDAZOLE 500 MG/100ML IV SOLN
INTRAVENOUS | Status: AC
  Filled 2021-01-28: qty 100

## 2021-01-28 MED ORDER — ALBUMIN HUMAN 5 % IV SOLN: INTRAVENOUS | Status: DC | PRN

## 2021-01-28 MED ORDER — FENTANYL CITRATE (PF) 100 MCG/2ML IJ SOLN
INTRAMUSCULAR | Status: AC
  Filled 2021-01-28: qty 2

## 2021-01-28 MED ORDER — KETOROLAC TROMETHAMINE 15 MG/ML IJ SOLN
INTRAMUSCULAR | Status: AC
  Administered 2021-01-28: 15 mg
  Filled 2021-01-28: qty 1

## 2021-01-28 MED ORDER — PHENYLEPHRINE 40 MCG/ML (10ML) SYRINGE FOR IV PUSH (FOR BLOOD PRESSURE SUPPORT)
PREFILLED_SYRINGE | INTRAVENOUS | Status: DC | PRN
  Administered 2021-01-28: 80 ug via INTRAVENOUS
  Administered 2021-01-28: 120 ug via INTRAVENOUS
  Administered 2021-01-28: 80 ug via INTRAVENOUS

## 2021-01-28 MED ORDER — LACTATED RINGERS IV SOLN: INTRAVENOUS | Status: DC | PRN

## 2021-01-28 MED ORDER — MORPHINE SULFATE (PF) 4 MG/ML IV SOLN: 4.0000 mg | Freq: Once | INTRAVENOUS | Status: DC

## 2021-01-28 MED ORDER — CEFAZOLIN SODIUM-DEXTROSE 2-4 GM/100ML-% IV SOLN
INTRAVENOUS | Status: AC
  Filled 2021-01-28: qty 100

## 2021-01-28 MED ORDER — FENTANYL CITRATE PF 50 MCG/ML IJ SOSY
PREFILLED_SYRINGE | INTRAMUSCULAR | Status: AC
  Filled 2021-01-28: qty 1

## 2021-01-28 MED ORDER — CHLORHEXIDINE GLUCONATE CLOTH 2 % EX PADS: 6.0000 | MEDICATED_PAD | Freq: Once | CUTANEOUS | Status: DC

## 2021-01-28 MED ORDER — KETOROLAC TROMETHAMINE 30 MG/ML IJ SOLN
INTRAMUSCULAR | Status: AC
  Administered 2021-01-28: 15 mg via INTRAVENOUS
  Filled 2021-01-28: qty 1

## 2021-01-28 MED ORDER — GABAPENTIN 300 MG PO CAPS: 300.0000 mg | ORAL_CAPSULE | ORAL | Status: DC

## 2021-01-28 MED ORDER — BUPIVACAINE-EPINEPHRINE (PF) 0.25% -1:200000 IJ SOLN
INTRAMUSCULAR | Status: AC
  Filled 2021-01-28: qty 60

## 2021-01-28 MED ORDER — PHENYLEPHRINE 40 MCG/ML (10ML) SYRINGE FOR IV PUSH (FOR BLOOD PRESSURE SUPPORT)
PREFILLED_SYRINGE | INTRAVENOUS | Status: AC
  Filled 2021-01-28: qty 10

## 2021-01-28 MED ORDER — SODIUM CHLORIDE 0.9 % IV SOLN: INTRAVENOUS | Status: DC

## 2021-01-28 MED ORDER — PROPOFOL 10 MG/ML IV BOLUS
INTRAVENOUS | Status: AC
  Filled 2021-01-28: qty 20

## 2021-01-28 MED ORDER — FENTANYL CITRATE (PF) 100 MCG/2ML IJ SOLN
INTRAMUSCULAR | Status: DC | PRN
  Administered 2021-01-28 (×2): 50 ug via INTRAVENOUS

## 2021-01-28 MED ORDER — PHENYLEPHRINE HCL-NACL 20-0.9 MG/250ML-% IV SOLN
INTRAVENOUS | Status: DC | PRN
  Administered 2021-01-28: 50 ug/min via INTRAVENOUS

## 2021-01-28 MED ORDER — ONDANSETRON HCL 4 MG/2ML IJ SOLN: 4.0000 mg | Freq: Once | INTRAMUSCULAR | Status: DC | PRN

## 2021-01-28 MED ORDER — ACETAMINOPHEN 500 MG PO TABS: 1000.0000 mg | ORAL_TABLET | ORAL | Status: DC

## 2021-01-28 MED ORDER — CEFAZOLIN SODIUM-DEXTROSE 2-4 GM/100ML-% IV SOLN
2.0000 g | INTRAVENOUS | Status: AC
  Administered 2021-01-28: 2 g via INTRAVENOUS

## 2021-01-28 MED ORDER — BUPIVACAINE LIPOSOME 1.3 % IJ SUSP
INTRAMUSCULAR | Status: AC
  Filled 2021-01-28: qty 20

## 2021-01-28 MED ORDER — BUPIVACAINE-EPINEPHRINE (PF) 0.5% -1:200000 IJ SOLN
INTRAMUSCULAR | Status: AC
  Filled 2021-01-28: qty 30

## 2021-01-28 MED ORDER — BUPIVACAINE LIPOSOME 1.3 % IJ SUSP
INTRAMUSCULAR | Status: DC | PRN
Start: 2021-01-28 — End: 2021-01-28
  Administered 2021-01-28: 20 mL

## 2021-01-28 MED ORDER — KETOROLAC TROMETHAMINE 30 MG/ML IJ SOLN
15.0000 mg | Freq: Once | INTRAMUSCULAR | Status: AC | PRN
Start: 2021-01-28 — End: 2021-01-28

## 2021-01-28 MED ORDER — METRONIDAZOLE 500 MG/100ML IV SOLN
500.0000 mg | INTRAVENOUS | Status: AC
  Administered 2021-01-28: 500 mg via INTRAVENOUS

## 2021-01-28 MED ORDER — ACETAMINOPHEN 10 MG/ML IV SOLN
INTRAVENOUS | Status: DC | PRN
  Administered 2021-01-28: 1000 mg via INTRAVENOUS

## 2021-01-28 MED ORDER — IOHEXOL 350 MG/ML SOLN
80.0000 mL | Freq: Once | INTRAVENOUS | Status: AC | PRN
  Administered 2021-01-28: 60 mL via INTRAVENOUS

## 2021-01-28 MED ORDER — FENTANYL CITRATE PF 50 MCG/ML IJ SOSY
25.0000 ug | PREFILLED_SYRINGE | Freq: Once | INTRAMUSCULAR | Status: AC
  Administered 2021-01-28: 25 ug via INTRAVENOUS
  Filled 2021-01-28: qty 1

## 2021-01-28 MED ORDER — LIDOCAINE HCL (CARDIAC) PF 100 MG/5ML IV SOSY
PREFILLED_SYRINGE | INTRAVENOUS | Status: DC | PRN
  Administered 2021-01-28: 60 mg via INTRAVENOUS

## 2021-01-28 MED ORDER — FENTANYL CITRATE PF 50 MCG/ML IJ SOSY: 25.0000 ug | PREFILLED_SYRINGE | INTRAMUSCULAR | Status: DC | PRN

## 2021-01-28 MED ORDER — LORAZEPAM 2 MG/ML IJ SOLN
0.5000 mg | Freq: Once | INTRAMUSCULAR | Status: AC
  Administered 2021-01-28: 0.5 mg via INTRAVENOUS
  Filled 2021-01-28: qty 1

## 2021-01-28 MED ORDER — SUCCINYLCHOLINE CHLORIDE 200 MG/10ML IV SOSY
PREFILLED_SYRINGE | INTRAVENOUS | Status: DC | PRN
  Administered 2021-01-28: 80 mg via INTRAVENOUS

## 2021-01-28 SURGICAL SUPPLY — 44 items
BAG COUNTER SPONGE SURGICOUNT (BAG) IMPLANT
CABLE HIGH FREQUENCY MONO STRZ (ELECTRODE) ×4 IMPLANT
CELLS DAT CNTRL 66122 CELL SVR (MISCELLANEOUS) ×3 IMPLANT
CHLORAPREP W/TINT 26 (MISCELLANEOUS) ×4 IMPLANT
COVER SURGICAL LIGHT HANDLE (MISCELLANEOUS) ×4 IMPLANT
DECANTER SPIKE VIAL GLASS SM (MISCELLANEOUS) ×4 IMPLANT
DEVICE SECURE STRAP 25 ABSORB (INSTRUMENTS) IMPLANT
DRAPE WARM FLUID 44X44 (DRAPES) ×4 IMPLANT
DRSG TEGADERM 2-3/8X2-3/4 SM (GAUZE/BANDAGES/DRESSINGS) ×8 IMPLANT
DRSG TEGADERM 4X4.75 (GAUZE/BANDAGES/DRESSINGS) ×4 IMPLANT
ELECT REM PT RETURN 15FT ADLT (MISCELLANEOUS) ×4 IMPLANT
GAUZE SPONGE 2X2 8PLY STRL LF (GAUZE/BANDAGES/DRESSINGS) ×3 IMPLANT
GLOVE SURG NEOPR MICRO LF SZ8 (GLOVE) ×4 IMPLANT
GLOVE SURG UNDER LTX SZ8 (GLOVE) ×4 IMPLANT
GOWN STRL REUS W/TWL XL LVL3 (GOWN DISPOSABLE) ×8 IMPLANT
IRRIG SUCT STRYKERFLOW 2 WTIP (MISCELLANEOUS) ×4
IRRIGATION SUCT STRKRFLW 2 WTP (MISCELLANEOUS) ×3 IMPLANT
KIT BASIN OR (CUSTOM PROCEDURE TRAY) ×4 IMPLANT
KIT TURNOVER KIT A (KITS) ×4 IMPLANT
MESH BARD SOFT 6X6IN (Mesh General) ×12 IMPLANT
NEEDLE INSUFFLATION 14GA 120MM (NEEDLE) ×4 IMPLANT
PAD POSITIONING PINK XL (MISCELLANEOUS) ×4 IMPLANT
RTRCTR WOUND ALEXIS 18CM MED (MISCELLANEOUS) ×4
SCISSORS LAP 5X35 DISP (ENDOMECHANICALS) ×4 IMPLANT
SET TUBE SMOKE EVAC HIGH FLOW (TUBING) ×4 IMPLANT
SLEEVE ADV FIXATION 5X100MM (TROCAR) ×8 IMPLANT
SPONGE GAUZE 2X2 STER 10/PKG (GAUZE/BANDAGES/DRESSINGS) ×1
STAPLER 90 3.5 STAND SLIM (STAPLE) ×4
STAPLER 90 3.5 STD SLIM (STAPLE) ×3 IMPLANT
STAPLER PROXIMATE 75MM BLUE (STAPLE) ×4 IMPLANT
SUT MNCRL AB 4-0 PS2 18 (SUTURE) ×4 IMPLANT
SUT PDS AB 1 CT1 27 (SUTURE) IMPLANT
SUT SILK 2 0 SH CR/8 (SUTURE) ×4 IMPLANT
SUT VIC AB 2-0 SH 27 (SUTURE) ×1
SUT VIC AB 2-0 SH 27X BRD (SUTURE) ×3 IMPLANT
SUT VICRYL 0 UR6 27IN ABS (SUTURE) ×4 IMPLANT
SUT VLOC BARB 180 ABS3/0GR12 (SUTURE) ×8
SUTURE VLOC BRB 180 ABS3/0GR12 (SUTURE) ×6 IMPLANT
TACKER 5MM HERNIA 3.5CML NAB (ENDOMECHANICALS) IMPLANT
TOWEL OR 17X26 10 PK STRL BLUE (TOWEL DISPOSABLE) ×4 IMPLANT
TOWEL OR NON WOVEN STRL DISP B (DISPOSABLE) ×4 IMPLANT
TRAY LAPAROSCOPIC (CUSTOM PROCEDURE TRAY) ×4 IMPLANT
TROCAR ADV FIXATION 5X100MM (TROCAR) ×4 IMPLANT
TROCAR XCEL BLUNT TIP 100MML (ENDOMECHANICALS) ×4 IMPLANT

## 2021-01-28 NOTE — Discharge Instructions (Signed)
SURGERY: POST OP INSTRUCTIONS (Surgery for small bowel obstruction, colon resection, etc)   ######################################################################  EAT Gradually transition to a high fiber diet with a fiber supplement over the next few days after discharge  WALK Walk an hour a day.  Control your pain to do that.    CONTROL PAIN Control pain so that you can walk, sleep, tolerate sneezing/coughing, go up/down stairs.  HAVE A BOWEL MOVEMENT DAILY Keep your bowels regular to avoid problems.  OK to try a laxative to override constipation.  OK to use an antidairrheal to slow down diarrhea.  Call if not better after 2 tries  CALL IF YOU HAVE PROBLEMS/CONCERNS Call if you are still struggling despite following these instructions. Call if you have concerns not answered by these instructions  ######################################################################   DIET Follow a light diet the first few days at home.  Start with a bland diet such as soups, liquids, starchy foods, low fat foods, etc.  If you feel full, bloated, or constipated, stay on a ful liquid or pureed/blenderized diet for a few days until you feel better and no longer constipated. Be sure to drink plenty of fluids every day to avoid getting dehydrated (feeling dizzy, not urinating, etc.). Gradually add a fiber supplement to your diet over the next week.  Gradually get back to a regular solid diet.  Avoid fast food or heavy meals the first week as you are more likely to get nauseated. It is expected for your digestive tract to need a few months to get back to normal.  It is common for your bowel movements and stools to be irregular.  You will have occasional bloating and cramping that should eventually fade away.  Until you are eating solid food normally, off all pain medications, and back to regular activities; your bowels will not be normal. Focus on eating a low-fat, high fiber diet the rest of your life  (See Getting to [place], below).  CARE of your INCISION or WOUND It is good for closed incision and even open wounds to be washed every day.  Shower every day.  Short baths are fine.  Wash the incisions and wounds clean with soap & water.     If you have a closed incision(s), wash the incision with soap & water every day.  You may leave closed incisions open to air if it is dry.   You may cover the incision with clean gauze & replace it after your daily shower for comfort.  It is good for closed incisions and even open wounds to be washed every day.  Shower every day.  Short baths are fine.  Wash the incisions and wounds clean with soap & water.    You may leave closed incisions open to air if it is dry.   You may cover the incision with clean gauze & replace it after your daily shower for comfort.  TEGADERM:  You have clear gauze band-aid dressings over your closed incision(s).  Remove the dressings 3 days after surgery.   If you have an open wound with a wound vac, see wound vac care instructions.     ACTIVITIES as tolerated Start light daily activities --- self-care, walking, climbing stairs-- beginning the day after surgery.  Gradually increase activities as tolerated.  Control your pain to be active.  Stop when you are tired.  Ideally, walk several times a day, eventually an hour a day.   Most people are back to most day-to-day activities in  a few weeks.  It takes 4-8 weeks to get back to unrestricted, intense activity. If you can walk 30 minutes without difficulty, it is safe to try more intense activity such as jogging, treadmill, bicycling, low-impact aerobics, swimming, etc. Save the most intensive and strenuous activity for last (Usually 4-8 weeks after surgery) such as sit-ups, heavy lifting, contact sports, etc.  Refrain from any intense heavy lifting or straining until you are off narcotics for pain control.  You will have off days, but things should improve  week-by-week. DO NOT PUSH THROUGH PAIN.  Let pain be your guide: If it hurts to do something, don't do it.  Pain is your body warning you to avoid that activity for another week until the pain goes down. You may drive when you are no longer taking narcotic prescription pain medication, you can comfortably wear a seatbelt, and you can safely make sudden turns/stops to protect yourself without hesitating due to pain. You may have sexual intercourse when it is comfortable. If it hurts to do something, stop.  MEDICATIONS Take your usually prescribed home medications unless otherwise directed.   Blood thinners:  Usually you can restart any strong blood thinners after the second postoperative day.  It is OK to take aspirin right away.     If you are on strong blood thinners (warfarin/Coumadin, Plavix, Xerelto, Eliquis, Pradaxa, etc), discuss with your surgeon, medicine PCP, and/or cardiologist for instructions on when to restart the blood thinner & if blood monitoring is needed (PT/INR blood check, etc).     PAIN CONTROL Pain after surgery or related to activity is often due to strain/injury to muscle, tendon, nerves and/or incisions.  This pain is usually short-term and will improve in a few months.  To help speed the process of healing and to get back to regular activity more quickly, DO THE FOLLOWING THINGS TOGETHER: Increase activity gradually.  DO NOT PUSH THROUGH PAIN Use Ice and/or Heat Try Gentle Massage and/or Stretching Take over the counter pain medication Take Narcotic prescription pain medication for more severe pain  Good pain control = faster recovery.  It is better to take more medicine to be more active than to stay in bed all day to avoid medications.  Increase activity gradually Avoid heavy lifting at first, then increase to lifting as tolerated over the next 6 weeks. Do not "push through" the pain.  Listen to your body and avoid positions and maneuvers than reproduce the pain.   Wait a few days before trying something more intense Walking an hour a day is encouraged to help your body recover faster and more safely.  Start slowly and stop when getting sore.  If you can walk 30 minutes without stopping or pain, you can try more intense activity (running, jogging, aerobics, cycling, swimming, treadmill, sex, sports, weightlifting, etc.) Remember: If it hurts to do it, then don't do it! Use Ice and/or Heat You will have swelling and bruising around the incisions.  This will take several weeks to resolve. Ice packs or heating pads (6-8 times a day, 30-60 minutes at a time) will help sooth soreness & bruising. Some people prefer to use ice alone, heat alone, or alternate between ice & heat.  Experiment and see what works best for you.  Consider trying ice for the first few days to help decrease swelling and bruising; then, switch to heat to help relax sore spots and speed recovery. Shower every day.  Short baths are fine.  It feels good!  Keep the incisions and wounds clean with soap & water.   Try Gentle Massage and/or Stretching Massage at the area of pain many times a day Stop if you feel pain - do not overdo it Take over the counter pain medication This helps the muscle and nerve tissues become less irritable and calm down faster Choose ONE of the following over-the-counter anti-inflammatory medications: Acetaminophen 578m tabs (Tylenol) 1-2 pills with every meal and just before bedtime (avoid if you have liver problems or if you have acetaminophen in you narcotic prescription) Naproxen 227mtabs (ex. Aleve, Naprosyn) 1-2 pills twice a day (avoid if you have kidney, stomach, IBD, or bleeding problems) Ibuprofen 20073mabs (ex. Advil, Motrin) 3-4 pills with every meal and just before bedtime (avoid if you have kidney, stomach, IBD, or bleeding problems) Take with food/snack several times a day as directed for at least 2 weeks to help keep pain / soreness down & more  manageable. Take Narcotic prescription pain medication for more severe pain A prescription for strong pain control is often given to you upon discharge (for example: oxycodone/Percocet, hydrocodone/Norco/Vicodin, or tramadol/Ultram) Take your pain medication as prescribed. Be mindful that most narcotic prescriptions contain Tylenol (acetaminophen) as well - avoid taking too much Tylenol. If you are having problems/concerns with the prescription medicine (does not control pain, nausea, vomiting, rash, itching, etc.), please call us [phone] see if we need to switch you to a different pain medicine that will work better for you and/or control your side effects better. If you need a refill on your pain medication, you must call the office before 4 pm and on weekdays only.  By federal law, prescriptions for narcotics cannot be called into a pharmacy.  They must be filled out on paper & picked up from our office by the patient or authorized caretaker.  Prescriptions cannot be filled after 4 pm nor on weekends.    WHEN TO CALL US [phone] uncontrolled or worsening pain  Fever over 101 F (38.5 C) Concerns with the incision: Worsening pain, redness, rash/hives, swelling, bleeding, or drainage Reactions / problems with new medications (itching, rash, hives, nausea, etc.) Nausea and/or vomiting Difficulty urinating Difficulty breathing Worsening fatigue, dizziness, lightheadedness, blurred vision Other concerns If you are not getting better after two weeks or are noticing you are getting worse, contact our office [phone] for further advice.  We may need to adjust your medications, re-evaluate you in the office, send you to the emergency room, or see what other things we can do to help. The clinic staff is available to answer your questions during regular business hours (8:30am-5pm).  Please don't hesitate to call and ask to speak to one of our nurses for clinical concerns.    A  sur[facility] from [facility] is always on call at the hospitals 24 hours/day If you have a medical emergency, go to the nearest emergency room or call 911.  FOLLOW UP in our office One the day of your discharge from the hospital (or the next business weekday), please call [facility] to set up or confirm an appointment to see your surgeon in the office for a follow-up appointment.  Usually it is 2-3 weeks after your surgery.   If you have skin staples at your incision(s), let the office know so we can set up a time in the office for the nurse to remove them (usually around 10 days after surgery). Make sure that you call for  appointments the day of discharge (or the next business weekday) from the hospital to ensure a convenient appointment time. IF YOU HAVE DISABILITY OR FAMILY LEAVE FORMS, BRING THEM TO THE OFFICE FOR PROCESSING.  DO NOT GIVE THEM TO YOUR DOCTOR.  [facility] [address] [place], [place], [place]  [address] ? [phone] - Main [phone] - [place],  [phone] - Fax [email or web address]  GETTING TO GOOD BOWEL HEALTH. It is expected for your digestive tract to need a few months to get back to normal.  It is common for your bowel movements and stools to be irregular.  You will have occasional bloating and cramping that should eventually fade away.  Until you are eating solid food normally, off all pain medications, and back to regular activities; your bowels will not be normal.   Avoiding constipation The goal: ONE SOFT BOWEL MOVEMENT A DAY!    Drink plenty of fluids.  Choose water first. TAKE A FIBER SUPPLEMENT EVERY DAY THE REST OF YOUR LIFE During your first week back home, gradually add back a fiber supplement every day Experiment which form you can tolerate.   There are many forms such as powders, tablets, wafers, gummies, etc Psyllium bran (Metamucil), methylcellulose (Citrucel), Miralax or Glycolax,  Benefiber, Flax Seed.  Adjust the dose week-by-week (1/2 dose/day to 6 doses a day) until you are moving your bowels 1-2 times a day.  Cut back the dose or try a different fiber product if it is giving you problems such as diarrhea or bloating. Sometimes a laxative is needed to help jump-start bowels if constipated until the fiber supplement can help regulate your bowels.  If you are tolerating eating & you are farting, it is okay to try a gentle laxative such as double dose MiraLax, prune juice, or Milk of Magnesia.  Avoid using laxatives too often. Stool softeners can sometimes help counteract the constipating effects of narcotic pain medicines.  It can also cause diarrhea, so avoid using for too long. If you are still constipated despite taking fiber daily, eating solids, and a few doses of laxatives, call our office. Controlling diarrhea Try drinking liquids and eating bland foods for a few days to avoid stressing your intestines further. Avoid dairy products (especially milk & ice cream) for a short time.  The intestines often can lose the ability to digest lactose when stressed. Avoid foods that cause gassiness or bloating.  Typical foods include beans and other legumes, cabbage, broccoli, and dairy foods.  Avoid greasy, spicy, fast foods.  Every person has some sensitivity to other foods, so listen to your body and avoid those foods that trigger problems for you. Probiotics (such as active yogurt, Align, etc) may help repopulate the intestines and colon with normal bacteria and calm down a sensitive digestive tract Adding a fiber supplement gradually can help thicken stools by absorbing excess fluid and retrain the intestines to act more normally.  Slowly increase the dose over a few weeks.  Too much fiber too soon can backfire and cause cramping & bloating. It is okay to try and slow down diarrhea with a few doses of antidiarrheal medicines.   Bismuth subsalicylate (ex. Kayopectate, Pepto Bismol)  for a few doses can help control diarrhea.  Avoid if pregnant.   Loperamide (Imodium) can slow down diarrhea.  Start with one tablet (47m) first.  Avoid if you are having fevers or severe pain.  ILEOSTOMY PATIENTS WILL HAVE CHRONIC DIARRHEA since their colon is  not in use.    Drink plenty of liquids.  You will need to drink even more glasses of water/liquid a day to avoid getting dehydrated. Record output from your ileostomy.  Expect to empty the bag every 3-4 hours at first.  Most people with a permanent ileostomy empty their bag 4-6 times at the least.   Use antidiarrheal medicine (especially Imodium) several times a day to avoid getting dehydrated.  Start with a dose at bedtime & breakfast.  Adjust up or down as needed.  Increase antidiarrheal medications as directed to avoid emptying the bag more than 8 times a day (every 3 hours). Work with your wound ostomy nurse to learn care for your ostomy.  See ostomy care instructions. TROUBLESHOOTING IRREGULAR BOWELS 1) Start with a soft & bland diet. No spicy, greasy, or fried foods.  2) Avoid gluten/wheat or dairy products from diet to see if symptoms improve. 3) Miralax 17gm or flax seed mixed in [place]. water or juice-daily. May use 2-4 times a day as needed. 4) Gas-X, Phazyme, etc. as needed for gas & bloating.  5) Prilosec (omeprazole) over-the-counter as needed 6)  Consider probiotics (Align, Activa, etc) to help calm the bowels down  Call your doctor if you are getting worse or not getting better.  Sometimes further testing (cultures, endoscopy, X-ray studies, CT scans, bloodwork, etc.) may be needed to help diagnose and treat the cause of the diarrhea. [facility], [place], [place], [place], [place]  [address] [phone] - Main.    [phone]  - Toll Free.   [phone] - Fax [email or web address]

## 2021-01-28 NOTE — ED Triage Notes (Signed)
"  Call came out as abdominal pain RLQ x 24 hours and nausea, no rebound tenderness, soft abdomen" per EMS

## 2021-01-28 NOTE — ED Notes (Signed)
Resting quietly with eyes closed. When she falls deep asleep oxygen saturation will drop in upper 80s due to sedation from pain medication. Placed on oxygen at 3L via [place] until more awake. No complaints. No n/v. No signs of distress. Awaiting surgeon to come and reduce hernia.

## 2021-01-28 NOTE — ED Notes (Signed)
Patient transported to CT 

## 2021-01-28 NOTE — ED Notes (Signed)
Ambulatory to bathroom with assistance to collect urine. No vomiting, no signs of distress.

## 2021-01-28 NOTE — ED Provider Notes (Signed)
[place] DEPT Provider Note   CSN: [id number] Arrival date & time: 01/28/21  1308     History Chief Complaint  Patient presents with   Abdominal Pain    RLQ    Rebekah Bush is a 87 y.o. female.  HPI She presents for evaluation abdominal pain which started yesterday.  Pain is localized to the right lower quadrant.  She came by EMS for evaluation.  She denies nausea, vomiting or diarrhea.  She has not had fever or chills.  She tried to contact her PCP, but could not arrange a visit.  She has not had any cough or shortness of breath.  There are no other known active modifying factors    Past Medical History:  Diagnosis Date   Basal cell carcinoma 08/29/2019   sclerosis on right temple   Gout    Hypertension    Hyponatremia    Retinal detachment    [facility]   Squamous cell carcinoma of skin 08/29/2019   in situ on left buccal cheek   Squamous cell carcinoma of skin 08/29/2019   in situ on left upper arm, posterior   Squamous cell carcinoma of skin 08/29/2019   in situ on left forearm, posterior    Patient Active Problem List   Diagnosis Date Noted   Generalized osteoarthritis 07/30/2020   Positive ANA (antinuclear antibody) 07/30/2020   Trigger finger, right ring finger 07/30/2020   Bilateral hand pain 07/30/2020   Pain in right hip 07/30/2020   Rash and other nonspecific skin eruption 07/30/2020   Advanced nonexudative age-related macular degeneration of right eye with subfoveal involvement 08/15/2019   Intermediate stage nonexudative age-related macular degeneration of left eye 08/01/2019   Right epiretinal membrane 08/01/2019   Shoulder dislocation, right, initial encounter 05/08/2019   Fall at home, initial encounter 05/07/2019   Closed fracture of superior ramus of pubis, initial encounter ([place]) 05/07/2019   Hypothyroidism 05/07/2019   Closed dislocation of right shoulder 05/07/2019   Prolonged QT interval  05/07/2019    Past Surgical History:  Procedure Laterality Date   CATARACT EXTRACTION Right    [facility]ers[facility] Crane - 1980s    CATARACT EXTRACTION Left    Gershon Crane - 1980s   CHOLECYSTECTOMY     RETINAL DETACHMENT SURGERY       OB History   No obstetric history on file.     Family History  Problem Relation Age of Onset   Ovarian cancer Mother    Pulmonary embolism Father    Hypertension Son     Social History   Tobacco Use   Smoking status: Former    Types: Cigarettes    Quit date: 1992    Years since quitting: 30.8   Smokeless tobacco: Never  Vaping Use   Vaping Use: Never used  Substance Use Topics   Alcohol use: Yes    Comment: Socially   Drug use: Never    Home Medications Prior to Admission medications   Medication Sig Start Date End Date Taking? Authorizing Provider  calcium-vitamin D (OSCAL WITH D) 500-200 MG-UNIT tablet Take 1 tablet by mouth daily. Patient not taking: No sig reported    [provider]  Cholecalciferol (VITAMIN D3 PO) Take 2 capsules by mouth daily.    [provider]  Colchicine 0.6 MG CAPS Take 0.6 mg by mouth daily. 08/21/20   Rice, Resa Miner, MD  furosemide (LASIX) 20 MG tablet Take 20 mg by mouth daily. 03/03/19   [provider]  hydrALAZINE (APRESOLINE) 50 MG tablet Take 50 mg by mouth 2 (two) times daily. 03/29/19   [provider]  HYDROcodone-acetaminophen (NORCO/VICODIN) 5-325 MG tablet Take 1 tablet by mouth every 6 (six) hours as needed for moderate pain. Patient not taking: No sig reported 05/08/19   [place] Donnamarie Poag, DO  ibuprofen (ADVIL) 200 MG tablet Take 400 mg by mouth every 6 (six) hours as needed for fever or moderate pain.    [provider]  levothyroxine (SYNTHROID, LEVOTHROID) 75 MCG tablet Take 75 mcg by mouth daily.    [provider]  MAGNESIUM PO Take 1 tablet by mouth daily.    [provider]  metoprolol succinate (TOPROL-XL) 100 MG 24 hr tablet Take 100 mg  by mouth daily. Take with or immediately following a meal.    [provider]  mupirocin ointment (BACTROBAN) 2 % Apply 1 application topically 2 (two) times daily. Patient not taking: No sig reported 09/10/19   Clark-Burning, Anderson Malta, PA-C  quinapril (ACCUPRIL) 20 MG tablet Take 20 mg by mouth at bedtime.    [provider]    Allergies    Sulfa antibiotics and Bactrim [sulfamethoxazole-trimethoprim]  Review of Systems   Review of Systems  All other systems reviewed and are negative.  Physical Exam Updated Vital Signs BP (!) 196/81 (BP Location: Right Arm)   Pulse 78   Temp 98.1 F (36.7 C) (Oral)   Resp 16   Ht 5\' 1"  (1.549 m)   Wt 52.2 kg   SpO2 97%   BMI 21.73 kg/m   Physical Exam Vitals and nursing note reviewed.  Constitutional:      General: She is not in acute distress.    Appearance: She is well-developed. She is not ill-appearing, toxic-appearing or diaphoretic.  HENT:     Head: Normocephalic and atraumatic.     Right Ear: External ear normal.     Left Ear: External ear normal.  Eyes:     Conjunctiva/sclera: Conjunctivae normal.     Pupils: Pupils are equal, round, and reactive to light.  Neck:     Trachea: Phonation normal.  Cardiovascular:     Rate and Rhythm: Normal rate and regular rhythm.     Heart sounds: Normal heart sounds.  Pulmonary:     Effort: Pulmonary effort is normal.     Breath sounds: Normal breath sounds.  Abdominal:     General: There is no distension.     Palpations: Abdomen is soft. There is no mass.     Tenderness: There is abdominal tenderness (Right lower quad, mild).     Hernia: No hernia is present.  Musculoskeletal:        General: Normal range of motion.     Cervical back: Normal range of motion and neck supple.  Skin:    General: Skin is warm and dry.  Neurological:     Mental Status: She is alert and oriented to person, place, and time.     Cranial Nerves: No cranial nerve deficit.     Sensory: No  sensory deficit.     Motor: No abnormal muscle tone.     Coordination: Coordination normal.  Psychiatric:        Mood and Affect: Mood normal.        Behavior: Behavior normal.        Thought Content: Thought content normal.        Judgment: Judgment normal.    ED Results / Procedures /  Treatments   Labs (all labs ordered are listed, but only abnormal results are displayed) Labs Reviewed - No data to display  EKG None  Radiology No results found.  Procedures Procedures   Medications Ordered in ED Medications - No data to display  ED Course  I have reviewed the triage vital signs and the nursing notes.  Pertinent labs & imaging results that were available during my care of the patient were reviewed by me and considered in my medical decision making (see chart for details).    MDM Rules/Calculators/A&P                            Patient Vitals for the past 24 hrs:  BP Temp Temp src Pulse Resp SpO2 Height Weight  01/28/21 1325 -- -- -- -- -- -- 5\' 1"  (1.549 m) 52.2 kg  01/28/21 1321 (!) 196/81 98.1 F (36.7 C) Oral 78 16 97 % 5\' 1"  (1.549 m) 53.5 kg    4:40 PM reevaluation with update and discussion. After initial assessment and treatment, an updated evaluation reveals she remains somewhat uncomfortable and is nauseated.  Attempted to reduce right inguinal hernia, there is a mass approximately 3 cm in the deep right inguinal canal.  This mass is tender and nonreducible.  I asked nursing staff to place and ice pack on the area, and ordered fentanyl and Ativan.  Anticipate general surgery consultation. Daleen Bo   Medical Decision Making:  This patient is presenting for evaluation of abdominal pain, which does require a range of treatment options, and is a complaint that involves a moderate risk of morbidity and mortality. The differential diagnoses include and intestinal disorder such as colitis, appendicitis, nonspecific abdominal pain. I decided to review old  records, and in summary elderly female presenting with pain is primary complaint without worrisome associated symptoms.  No prior abdominal surgeries..  I did not require additional historical information from anyone.  Clinical Laboratory Tests Ordered, included CBC, Metabolic panel, and Urinalysis, lipase, viral panel. Review indicates normal except as glucose slightly high, white count high, hemoglobin high, nonspecific urinalysis changes without distinct signs for infection. Radiologic Tests Ordered, included CT abdomen pelvis.  I independently Visualized: Radiograph images, which show incarcerated right inguinal hernia, containing small bowel with proximal bowel obstruction   Critical Interventions-clinical evaluation, laboratory testing, IV fluids, CT imaging, medication treatment, attempted reduction of inguinal hernia, consult general surgery  After These Interventions, the Patient was reevaluated and was found with abdominal pain and nausea secondary to incarcerated right inguinal hernia.  Patient with likely dehydration and elevated white count.  She requires surgical evaluation.  CRITICAL CARE-no Performed by: Daleen Bo  Nursing Notes Reviewed/ Care Coordinated Applicable Imaging Reviewed Interpretation of Laboratory Data incorporated into ED treatment   Page placed for general surgery consult.  Dr. Tomi Bamberger will talk to them to arrange consultation.    Final Clinical Impression(s) / ED Diagnoses Final diagnoses:  Incarcerated hernia  SBO (small bowel obstruction) ([place])    Rx / DC Orders ED Discharge Orders     None        Daleen Bo, MD 01/28/21 [phone]

## 2021-01-28 NOTE — Consult Note (Signed)
Rebekah Bush  [birth year] [id number]  CARE TEAM:  PCP: Roetta Sessions, NP  Outpatient Care Team: Patient Care Team: Roetta Sessions, NP as PCP - General (Nurse Practitioner) Janne Napoleon, PA-C (Inactive) (Dermatology)  Inpatient Treatment Team: Treatment Team: Attending Provider: Dorie Rank, MD; Technician: Shella Spearing; Registered Nurse: Christiane Ha, RN; Technician: Gwendolyn Fill, NT; Consulting Physician: Edison Pace, Md, MD   This patient is a 87 y.o.female who presents today for surgical evaluation at the request of Dr Dorie Rank.   Chief complaint / Reason for evaluation: Incarcerated groin hernia  Elderly woman who had decreased appetite and abdominal pain yesterday.  Worsened.  Felt pain in her right lower side.  Felt worse.  Called emergency services.  Came around 1 PM.  Distended.  Work-up concerning for incarcerated hernia and right groin.  Not reducible.  CT scan noted small bowel trapped within it.  Surgical consultation requested.  Paged at 5:51pm.  She is widowed.  Daughters live in the region are involved.  She lives in assisted living.  She claims she can walk about my without difficulty.  No history of cardiac or pulmonary issues.  No prior stroke.  No blood thinners.  No diabetes.  She does not smoke.  Usually moves her bowels most days.  She recalls being diagnosed with a hernia for the past few years.  Usually has not bothered her.  She recalls having a cholecystectomy done about 7 years ago.  Assessment  Rebekah Bush  88 y.o. female       Problem List:  Principal Problem:   Incarcerated femoral hernia   Small bowel obstruction with transition zone going into right femoral hernia by exam and CT scan. Incarcerated.     Plan:  Ice pack sedation Trendelenburg positioning and 10 minutes of attempted reduction were unsuccessful.  It does not look like it is perforated or ischemic or necrotic.  However clearly is a transition  point and causing pain.  I think this requires emergent operative exploration.  Reduction and then hernia repair.  Possible need for small bowel resection.  She is elderly but relatively healthy for her age.  Her operative risks are increased in the setting of emergency and incarceration possible strangulation.  However nonoperative risks are much higher.  She is not in shock and has decent performance status.  I will try and see if I can do this laparoscopically in the hopes of reducing.  If there is no infection then mesh placement, especially in light of it looking like a femoral hernia..  If there is necrosis gangrene or need for bowel resection, most likely will have to use Phasix absorbable mesh instead.  The anatomy & physiology of the abdominal wall and pelvic floor was discussed.  The pathophysiology of hernias in the inguinal and pelvic region was discussed.  Natural history risks such as progressive enlargement, pain, incarceration, and strangulation was discussed.   Contributors to complications such as smoking, obesity, diabetes, prior surgery, etc were discussed.    I feel the risks of no intervention will lead to serious problems that outweigh the operative risks; therefore, I recommended surgery to reduce and repair the hernia.  I explained laparoscopic techniques with possible need for an open approach.  I noted usual use of mesh to patch and/or buttress hernia repair  Risks such as bleeding, infection, abscess, need for further treatment, injury to other organs, need for repair of tissues / organs, stroke, heart attack,  death, and other risks were discussed.  I noted a good likelihood this will help address the problem.   Goals of post-operative recovery were discussed as well.  Possibility that this will not correct all symptoms was explained.   Possibility of reherniation was discussed.  We will work to minimize complications.     Questions were answered.  The patient expresses  understanding & wishes to proceed with surgery.   -VTE prophylaxis- SCDs, etc -mobilize as tolerated to help recovery  30 minutes spent in review, evaluation, examination, counseling, and coordination of care.  More than 50% of that time was spent in counseling.  Rebekah Hector, MD, FACS, MASCRS Esophageal, Gastrointestinal & Colorectal Surgery Robotic and Minimally Invasive Surgery  [facility], [place]  [place]. [address] [place], [place] [address] [phone] Fax [phone] Main  CONTACT INFORMATION:  Weekday (9AM-5PM): Call [facility] main office at [phone]  Weeknight (5PM-9AM) or Weekend/Holiday: Check [email or web address] (password " [email or web address]") for General Surgery [facility] coverage  (Please, do not use SecureChat as it is not reliable communication to operating surgeons for immediate patient care)      01/28/2021      Past Medical History:  Diagnosis Date   Basal cell carcinoma 08/29/2019   sclerosis on right temple   Gout    Hypertension    Hyponatremia    Retinal detachment    [facility]   Squamous cell carcinoma of skin 08/29/2019   in situ on left buccal cheek   Squamous cell carcinoma of skin 08/29/2019   in situ on left upper arm, posterior   Squamous cell carcinoma of skin 08/29/2019   in situ on left forearm, posterior    Past Surgical History:  Procedure Laterality Date   CATARACT EXTRACTION Right    [facility] - 1980s    CATARACT EXTRACTION Left    [facility] Crane - 1980s   CHOLECYSTECTOMY     RETINAL DETACHMENT SURGERY      Social History   Socioeconomic History   Marital status: Widowed    Spouse name: Not on file   Number of children: Not on file   Years of education: Not on file   Highest education level: Not on file  Occupational History   Not on file  Tobacco Use   Smoking status: Former    Types: Cigarettes    Quit date: 36    Years since quitting: 30.8   Smokeless  tobacco: Never  Vaping Use   Vaping Use: Never used  Substance and Sexual Activity   Alcohol use: Yes    Comment: Socially   Drug use: Never   Sexual activity: Not on file  Other Topics Concern   Not on file  Social History Narrative   Not on file   Social Determinants of Health   Financial Resource Strain: Not on file  Food Insecurity: Not on file  Transportation Needs: Not on file  Physical Activity: Not on file  Stress: Not on file  Social Connections: Not on file  Intimate Partner Violence: Not on file    Family History  Problem Relation Age of Onset   Ovarian cancer Mother    Pulmonary embolism Father    Hypertension Son     Current Facility-Administered Medications  Medication Dose Route Frequency Provider Last Rate Last Admin   0.9 %  sodium chloride infusion   Intravenous Continuous Daleen Bo, MD 125 mL/hr at 01/28/21 [place]  at 01/28/21 1410   morphine 4 MG/ML injection 4 mg  4 mg Intravenous Once Dorie Rank, MD       Current Outpatient Medications  Medication Sig Dispense Refill   acetaminophen (TYLENOL) 500 MG tablet Take 500 mg by mouth every 6 (six) hours as needed (for pain).     Calcium Carb-Cholecalciferol (CALTRATE 600+D3 PO) Take 2 tablets by mouth in the morning.     furosemide (LASIX) 20 MG tablet Take 20 mg by mouth daily.     hydrALAZINE (APRESOLINE) 50 MG tablet Take 50 mg by mouth in the morning and at bedtime.     ibuprofen (ADVIL) 200 MG tablet Take 200 mg by mouth every 6 (six) hours as needed (for pain).     levothyroxine (SYNTHROID, LEVOTHROID) 75 MCG tablet Take 75 mcg by mouth at bedtime.     magnesium oxide (MAG-OX) 400 MG tablet Take 400 mg by mouth daily.     metoprolol succinate (TOPROL-XL) 100 MG 24 hr tablet Take 100 mg by mouth daily. Take with or immediately following a meal.     quinapril (ACCUPRIL) 20 MG tablet Take 20 mg by mouth every evening.     Colchicine 0.6 MG CAPS Take 0.6 mg by mouth daily. (Patient not taking:  No sig reported) 30 capsule 0   HYDROcodone-acetaminophen (NORCO/VICODIN) 5-325 MG tablet Take 1 tablet by mouth every 6 (six) hours as needed for moderate pain. (Patient not taking: Reported on 01/28/2021) 15 tablet 0   mupirocin ointment (BACTROBAN) 2 % Apply 1 application topically 2 (two) times daily. (Patient not taking: No sig reported) 22 g 1     Allergies  Allergen Reactions   Bactrim [Sulfamethoxazole-Trimethoprim] Shortness Of Breath, Nausea And Vomiting and Other (See Comments)    Headaches, also   Sulfa Antibiotics Shortness Of Breath, Nausea And Vomiting and Other (See Comments)    Headaches, also     ROS:   All other systems reviewed & are negative except per HPI or as noted below: Constitutional:  No fevers, chills, sweats.  Weight stable Eyes:  No vision changes, No discharge HENT:  No sore throats, nasal drainage Lymph: No neck swelling, No bruising easily Pulmonary:  No cough, productive sputum CV: No orthopnea, PND  Patient walks 30 minutes for about 1 miles without difficulty.  No exertional chest/neck/shoulder/arm pain. GI:  No personal nor family history of GI/colon cancer, inflammatory bowel disease, irritable bowel syndrome, allergy such as Celiac Sprue, dietary/dairy problems, colitis, ulcers nor gastritis.  No recent sick contacts/gastroenteritis.  No travel outside the country.  No changes in diet. Renal: No UTIs, No hematuria Genital:  No drainage, bleeding, masses Musculoskeletal: No severe joint pain.  Good ROM major joints Skin:  No sores or lesions.  No rashes Heme/Lymph:  No easy bleeding.  No swollen lymph nodes Neuro: No focal weakness/numbness.  No seizures Psych: No suicidal ideation.  No hallucinations  BP (!) 147/75   Pulse 82   Temp 98.1 F (36.7 C) (Oral)   Resp 18   Ht 5\' 1"  (1.549 m)   Wt 52.2 kg   SpO2 98%   BMI 21.73 kg/m   Physical Exam:  Constitutional: Not cachectic.  Hygeine adequate.  Sleeping but awakens in no acute  distress vitals signs as above.   Eyes: Pupils reactive, normal extraocular movements. Sclera nonicteric Neuro: CN II-XII intact.  No major focal sensory defects.  No major motor deficits. Lymph: No head/neck/groin lymphadenopathy Psych:  No severe agitation.  No  severe anxiety.  Judgment & insight Adequate, Oriented x4, HENT: Normocephalic, Mucus membranes moist.  No thrush.   Neck: Supple, No tracheal deviation.  No obvious thyromegaly Chest: No pain to chest wall compression.  Good respiratory excursion.  No audible wheezing CV:  Pulses intact.   Regular rhythm.  No major extremity edema  Abdomen:  Flat Hernia: Not present. Diastasis recti: Mild supraumbilical midline. Soft.   Moderately distended.  Nontender.  No hepatomegaly.  No splenomegaly  Gen:  Inguinal hernia: Not present.  No skin edema nor cellulitis.  No gangrene.  4x4x4 cm spherical mass along the right inner thigh and low pelvis just superficial to the femoral pulse consistent with femoral hernia.  This is too low to be an inguinal hernia.  Attempted reduction with the patient in Trendelenburg and made efforts for pressure and reduction.  Unsuccessful after 10 minutes.  Uncomfortable. Inguinal lymph nodes: without lymphadenopathy.    Rectal:   Ext: No obvious deformity or contracture.  Edema: Not present.  No cyanosis Skin: No major subcutaneous nodules.  Warm and dry Musculoskeletal: Some spinal scoliosis and kyphosis.  Arthritic hands but decent handgrip.  Severe joint rigidity not present.  No obvious clubbing.  No digital petechiae.     Results:   Labs: Results for orders placed or performed during the hospital encounter of 01/28/21 (from the past 48 hour(s))  Comprehensive metabolic panel     Status: Abnormal   Collection Time: 01/28/21  2:03 PM  Result Value Ref Range   Sodium 142 135 - 145 mmol/L   Potassium 3.5 3.5 - 5.1 mmol/L   Chloride 102 98 - 111 mmol/L   CO2 29 22 - 32 mmol/L   Glucose, Bld 104 (H) 70  - 99 mg/dL    Comment: Glucose reference range applies only to samples taken after fasting for at least 8 hours.   BUN 16 8 - 23 mg/dL   Creatinine, Ser 0.81 0.44 - 1.00 mg/dL   Calcium 9.9 8.9 - 10.3 mg/dL   Total Protein 7.0 6.5 - 8.1 g/dL   Albumin 3.6 3.5 - 5.0 g/dL   AST 20 15 - 41 U/L   ALT 14 0 - 44 U/L   Alkaline Phosphatase 79 38 - 126 U/L   Total Bilirubin 0.9 0.3 - 1.2 mg/dL   GFR, Estimated >60 >60 mL/min    Comment: (NOTE) Calculated using the CKD-EPI Creatinine Equation (2021)    Anion gap 11 5 - 15    Comment: Performed at [facility], [place] [address], [place], [place] [address]  CBC with Differential     Status: Abnormal   Collection Time: 01/28/21  2:03 PM  Result Value Ref Range   WBC 15.2 (H) 4.0 - 10.5 K/uL   RBC 5.28 (H) 3.87 - 5.11 MIL/uL   Hemoglobin 15.6 (H) 12.0 - 15.0 g/dL   HCT 47.7 (H) 36.0 - 46.0 %   MCV 90.3 80.0 - 100.0 fL   MCH 29.5 26.0 - 34.0 pg   MCHC 32.7 30.0 - 36.0 g/dL   RDW 14.2 11.5 - 15.5 %   Platelets 327 150 - 400 K/uL   nRBC 0.0 0.0 - 0.2 %   Neutrophils Relative % 84 %   Neutro Abs 12.9 (H) 1.7 - 7.7 K/uL   Lymphocytes Relative 6 %   Lymphs Abs 0.8 0.7 - 4.0 K/uL   Monocytes Relative 9 %   Monocytes Absolute 1.3 (H) 0.1 - 1.0 K/uL   Eosinophils  Relative 0 %   Eosinophils Absolute 0.1 0.0 - 0.5 K/uL   Basophils Relative 0 %   Basophils Absolute 0.0 0.0 - 0.1 K/uL   Immature Granulocytes 1 %   Abs Immature Granulocytes 0.07 0.00 - 0.07 K/uL    Comment: Performed at [facility], [place] [address], [place], [place] [address]  Lipase, blood     Status: None   Collection Time: 01/28/21  2:03 PM  Result Value Ref Range   Lipase 25 11 - 51 U/L    Comment: Performed at [facility], [place] [address], [place], [place] [address]  Urinalysis, Routine w reflex microscopic Urine, Clean Catch     Status: Abnormal   Collection Time: 01/28/21  2:03 PM  Result Value Ref Range   Color,  Urine YELLOW YELLOW   APPearance HAZY (A) CLEAR   Specific Gravity, Urine 1.017 1.005 - 1.030   pH 6.0 5.0 - 8.0   Glucose, UA NEGATIVE NEGATIVE mg/dL   Hgb urine dipstick SMALL (A) NEGATIVE   Bilirubin Urine NEGATIVE NEGATIVE   Ketones, ur 20 (A) NEGATIVE mg/dL   Protein, ur 30 (A) NEGATIVE mg/dL   Nitrite NEGATIVE NEGATIVE   Leukocytes,Ua LARGE (A) NEGATIVE   RBC / HPF 0-5 0 - 5 RBC/hpf   WBC, UA 11-20 0 - 5 WBC/hpf   Bacteria, UA RARE (A) NONE SEEN   Squamous Epithelial / LPF 0-5 0 - 5   Mucus PRESENT    Hyaline Casts, UA PRESENT     Comment: Performed at [facility], [place] [address], [place],  [address]    Imaging / Studies: CT Abdomen Pelvis W Contrast  Addendum Date: 01/28/2021   ADDENDUM REPORT: 01/28/2021 16:50 ADDENDUM: These results were called by telephone at the time of interpretation on 01/28/2021 at 4:48 pm to provider [facility] , who verbally acknowledged these results. Electronically Signed   By: Iven Finn M.D.   On: 01/28/2021 16:50   Result Date: 01/28/2021 CLINICAL DATA:  Abdominal abscess/infection suspected. abdominal pain RLQ x 24 hours and nausea, no rebound tenderness, soft abdomen EXAM: CT ABDOMEN AND PELVIS WITH CONTRAST TECHNIQUE: Multidetector CT imaging of the abdomen and pelvis was performed using the standard protocol following bolus administration of intravenous contrast. CONTRAST:  46mL OMNIPAQUE IOHEXOL 350 MG/ML SOLN COMPARISON:  None. FINDINGS: Lower chest: Right lower lobe traction bronchiectasis and bronchial wall thickening. No acute abnormality. Hepatobiliary: Fluid density lesions within left hepatic lobe likely represents a simple hepatic cysts. Subcentimeter hypodensities are too small to characterize. Status post cholecystectomy. No biliary dilatation. Pancreas: No focal lesion. Normal pancreatic contour. No surrounding inflammatory changes. No main pancreatic ductal dilatation. Spleen: Normal in size without  focal abnormality. Adrenals/Urinary Tract: No adrenal nodule bilaterally. Bilateral kidneys enhance symmetrically. Subcentimeter hypodensities are too small to characterize. No hydronephrosis. No hydroureter. The urinary bladder is unremarkable. Stomach/Bowel: Stomach is within normal limits. Fluid dilatation of the proximal to mid small bowel with a transition point at the entrance site of a right inguinal hernia. The small bowel distal to this is decompressed. Short segment of small bowel is noted distended with fluid within the hernia sac. Small bowel within the mid abdomen measures up to 4.2 cm. No pneumatosis. No small bowel thickening. No evidence of large bowel wall thickening or dilatation. Appendix appears normal. Vascular/Lymphatic: No abdominal aorta or iliac aneurysm. Mild atherosclerotic plaque of the aorta and its branches. No abdominal, pelvic, or inguinal lymphadenopathy. Reproductive: Uterus and bilateral  adnexa are unremarkable. Other: Trace perisplenic free fluid. No intraperitoneal free gas. No organized fluid collection. Musculoskeletal: No abdominal wall hernia or abnormality. No suspicious lytic or blastic osseous lesions. No acute displaced fracture. Multilevel severe degenerative changes of the spine. Right hip severe degenerative changes. Pubic symphysis degenerative changes. IMPRESSION: 1. High-grade small-bowel obstruction of the proximal to mid small bowel with a transition point at the entrance site of a right inguinal hernia. Fluid distension of a short loop of small bowel noted within the right inguinal hernia. A developing closed loop obstruction of the short segment of small bowel contained within the right inguinal hernia is not excluded. No findings of ischemia are noted. 2. Severe degenerative changes of the right hip. Electronically Signed: By: Iven Finn M.D. On: 01/28/2021 16:40    Medications / Allergies: per chart  Antibiotics: Anti-infectives (From admission,  onward)    None         Note: Portions of this report may have been transcribed using voice recognition software. Every effort was made to ensure accuracy; however, inadvertent computerized transcription errors may be present.   Any transcriptional errors that result from this process are unintentional.    Rebekah Hector, MD, FACS, MASCRS Esophageal, Gastrointestinal & Colorectal Surgery Robotic and Minimally Invasive Surgery  [facility], [place]  [place]. [address] [place], [place] [address] [phone] Fax [phone] Main  CONTACT INFORMATION:  Weekday (9AM-5PM): Call [facility] main office at [phone]  Weeknight (5PM-9AM) or Weekend/Holiday: Check [email or web address] (password " [email or web address]") for General Surgery [facility] coverage  (Please, do not use SecureChat as it is not reliable communication to operating surgeons for immediate patient care)        01/28/2021  6:14 PM

## 2021-01-28 NOTE — Anesthesia Preprocedure Evaluation (Signed)
Anesthesia Evaluation  Patient identified by MRN, date of birth, ID band Patient awake    Reviewed: Allergy & Precautions, NPO status , Patient's Chart, lab work & pertinent test results  Airway Mallampati: II  TM Distance: >3 FB Neck ROM: Full    Dental no notable dental hx.    Pulmonary neg pulmonary ROS, former smoker,    Pulmonary exam normal breath sounds clear to auscultation       Cardiovascular hypertension, Normal cardiovascular exam Rhythm:Regular Rate:Normal     Neuro/Psych negative neurological ROS  negative psych ROS   GI/Hepatic Neg liver ROS, SBO with possible dead bowel   Endo/Other  Hypothyroidism   Renal/GU negative Renal ROS  negative genitourinary   Musculoskeletal  (+) Arthritis ,   Abdominal   Peds negative pediatric ROS (+)  Hematology negative hematology ROS (+)   Anesthesia Other Findings   Reproductive/Obstetrics negative OB ROS                             Anesthesia Physical Anesthesia Plan  ASA: 3 and emergent  Anesthesia Plan: General   Post-op Pain Management:    Induction: Intravenous and Rapid sequence  PONV Risk Score and Plan: 3 and Ondansetron, Dexamethasone and Treatment may vary due to age or medical condition  Airway Management Planned: Oral ETT  Additional Equipment:   Intra-op Plan:   Post-operative Plan: Extubation in OR  Informed Consent: I have reviewed the patients History and Physical, chart, labs and discussed the procedure including the risks, benefits and alternatives for the proposed anesthesia with the patient or authorized representative who has indicated his/her understanding and acceptance.     Dental advisory given  Plan Discussed with: CRNA and Surgeon  Anesthesia Plan Comments:         Anesthesia Quick Evaluation

## 2021-01-28 NOTE — Progress Notes (Signed)
I updated the patient's status & surgery recommendations to the patient  Recommendations were made.  Questions were answered.  She expressed understanding & appreciation.

## 2021-01-28 NOTE — ED Notes (Signed)
Pt ambulatory to bathroom w/ limited assist. Pt ambulates w/ cane at baseline.

## 2021-01-28 NOTE — ED Provider Notes (Signed)
Patient initially seen by Dr. Eulis Foster.  Please see his note.  I just spoke with Dr. Johney Maine.  He will come evaluate the patient in the ED   Dorie Rank, MD 01/28/21 1756

## 2021-01-28 NOTE — Transfer of Care (Signed)
Immediate Anesthesia Transfer of Care Note  Patient: Rebekah Bush  Procedure(s) Performed: LAPAROSCOPIC FEMORAL  HERNIA REPAIR WITH MESH (Right: Abdomen) SMALL BOWEL RESECTION, LAPAROSCOPIC (Abdomen)  Patient Location: PACU  Anesthesia Type:General  Level of Consciousness: awake and drowsy  Airway & Oxygen Therapy: Patient Spontanous Breathing and Patient connected to face mask oxygen  Post-op Assessment: Report given to RN and Post -op Vital signs reviewed and stable  Post vital signs: Reviewed and stable  Last Vitals:  Vitals Value Taken Time  BP 171/82 01/28/21 2201  Temp    Pulse 96 01/28/21 2205  Resp 21 01/28/21 2204  SpO2 100 % 01/28/21 2205  Vitals shown include unvalidated device data.  Last Pain:  Vitals:   01/28/21 1815  TempSrc:   PainSc: 0-No pain         Complications: No notable events documented.

## 2021-01-28 NOTE — Op Note (Addendum)
01/28/2021  9:58 PM  PATIENT:  Rebekah Bush  87 y.o. female  Patient Care Team: Roetta Sessions, NP as PCP - General (Nurse Practitioner) Janne Napoleon, PA-C (Inactive) (Dermatology)  PRE-OPERATIVE DIAGNOSIS:  INCARCERATED RIGHT FEMORAL HERNIA  POST-OPERATIVE DIAGNOSIS:   INCARCERATED RIGHT FEMORAL HERNIA WITH SMALL BOWEL OBSTRUCTION BILATERAL INGUINAL HERNIAE RIGHT RETROPSOAS HERNIA  PROCEDURE:   LAPAROSCOPIC FEMORAL HERNIA REPAIR WITH MESH LAPAROSCOPIC BILATERAL INGUINAL HERNIA REPAIR WITH MESH LAPAROSCOPIC RETROPSOAS HERNIA REPAIR WITH MESH TRANSVERSUS ABDOMINIS PLANE (TAP) BLOCK - BILATERAL  SURGEON:  Adin Hector, MD  ASSISTANT: None  ANESTHESIA:     Regional ilioinguinal and genitofemoral and spermatic cord nerve blocks  General  Regional TRANSVERSUS ABDOMINIS PLANE (TAP) nerve block for perioperative & postoperative pain control provided with liposomal bupivacaine (Experel) mixed with 0.25% bupivacaine as a Bilateral TAP block x 96mL each side at the level of the transverse abdominis & preperitoneal spaces along the flank at the anterior axillary line, from subcostal ridge to iliac crest under laparoscopic guidance    EBL:  Total I/O In: 1250 [I.V.:1000; IV Piggyback:250] Out: 1275 [Urine:125; Other:1100; Blood:50].  See anesthesia record  Delay start of Pharmacological VTE agent (>24hrs) due to surgical blood loss or risk of bleeding:  no  DRAINS: NONE  SPECIMEN:  FEMORAL HERNIA SAC (not sent)  DISPOSITION OF SPECIMEN:  N/A  COUNTS:  YES  PLAN OF CARE: Discharge to home after PACU  PATIENT DISPOSITION:  PACU - hemodynamically stable.  INDICATION: Elderly woman with known hernia in her groin.  Usually reducible.  Developed nausea and worsening abdominal pain and right groin pain for the past 2 days.  Much more intense today.  Vomiting.  Came emergency room.  Mass noted in right groin suspicious for incarcerated hernia.  Not reducible.   CT scan notes small bowel incarcerated within it and transition point for small bowel obstruction.  Not reducible by me.  I recommended laparoscopic possible open exploration.  The anatomy & physiology of the abdominal wall and pelvic floor was discussed.  The pathophysiology of hernias in the inguinal and pelvic region was discussed.  Natural history risks such as progressive enlargement, pain, incarceration & strangulation was discussed.   Contributors to complications such as smoking, obesity, diabetes, prior surgery, etc were discussed.    I feel the risks of no intervention will lead to serious problems that outweigh the operative risks; therefore, I recommended surgery to reduce and repair the hernia.  I explained laparoscopic techniques with possible need for an open approach.  I noted usual use of mesh to patch and/or buttress hernia repair.  Possible need for bowel resection.  Possible need for absorbable versus permanent mesh dependant on case  Risks such as bleeding, infection, abscess, need for further treatment, heart attack, death, and other risks were discussed.  I noted a good likelihood this will help address the problem.   Goals of post-operative recovery were discussed as well.  Possibility that this will not correct all symptoms was explained.  I stressed the importance of low-impact activity, aggressive pain control, avoiding constipation, & not pushing through pain to minimize risk of post-operative chronic pain or injury. Possibility of reherniation was discussed.  We will work to minimize complications.     The patient expresses understanding & wishes to proceed with surgery.  Call discussed with the patient's daughter who expressed understanding wished to proceed with surgery for her mother.  Discussed can with the patient in the holding area.  OR FINDINGS:  Right femoral hernia with small bowel incarcerated within it -very short segment slightly more than a Richter's type hernia.   Initially mildly ischemic but pinked up .  No gangrene or perforation.  No cellulitis or abscess.  Hernia sac excised.  Small bowel markedly improved after 10 minutes and nearly normal except for some mild edema.  Therefore, no small bowel resection done.  Fat herniating in the retroperitoneum lateral to the psoas consistent with a retropsoas hernia.  No strangulation  Bilateral direct space hernias incarcerated with fat.    No indirect inguinal hernias.  No left femoral nor left obturator hernia.  Mild obturator foramen laxity on the right side but no true hernia.    DESCRIPTION:  The patient was identified & brought into the operating room. The patient was positioned supine with arms tucked. SCDs were active during the entire case. The patient underwent general anesthesia without any difficulty.  The patient's bladder was emptied with a Foley catheter sterilely placed.  Orogastric tube placed and over a liter of thick bilious return.  Transition to a nasogastric tube with another 500 mL returned.  The abdomen was prepped and draped in a sterile fashion. A Surgical Timeout confirmed our plan.  I made a transverse incision through the inferior umbilical fold.  I made a small transverse nick through the anterior rectus fascia contralateral to the inguinal hernia side and placed a 0-vicryl stitch through the fascia.  I placed a Hasson trocar into the preperitoneal plane.  Entry was clean.  We induced carbon dioxide insufflation. Camera inspection revealed no injury.  I used a 34mm angled scope to bluntly free the peritoneum off the infraumbilical anterior abdominal wall.  I created enough of a preperitoneal pocket to place 46mm ports into the right & left mid-abdomen into this preperitoneal cavity.  I focused attention on the RIGHT pelvis since that was the dominant hernia side.   I used blunt & focused sharp dissection to free the peritoneum off the flank and down to the pubic rim.  I freed the  anteriolateral bladder wall off the anteriolateral pelvic wall, sparing midline attachments.   I located a swath of peritoneum going into a hernia fascial defect at the  direct space consistent with  a direct space inguinal hernia..  I gradually freed the peritoneal hernia sac off safely and reduced it into the preperitoneal space.  I freed the peritoneum off the round ligament.  With this I could see a swath of peritoneum incarcerated in the femoral canal superior and medial to the femoral vessels.  I worked to help free the hernia sac circumferentially of the femoral canal.  However I cannot get the small bowel to reduce I did open up the hernia sac and confirmed that this was a transition point of the small intestine.  Small bowel was viable.  Because I could not reduce it, I transected the inguinal ligament on the medial aspect of the femoral canal anterior to the hernia sac once I came for that I can get the hernia sac to reduce and the small bowel.  Hernia sac had some ischemia to it.  Took out the small bowel which had a short Richter like band/knuckle that clearly was the transition point of the bowel.  There was some swelling and discoloration there.  Concerning.  I transected the irritated hernia sac and removed it.  Did copious irrigation with good return.  Hemostasis was good.    I freed peritoneum off the retroperitoneum along  the right psoas muscle.  With this there was retroperitoneal fat going up lateral to the psoas muscle along the canal consistent with a lateral/retropsoas hernia.  Contained a moderate volume of fat.  Reduced it out.  There is no small bowel or colon trapped within it.  I freed the peritoneum off the round ligament.  I checked & assured hemostasis.     I turned attention on the opposite  LEFT pelvis.  I did dissection in a similar, mirror-image fashion. The patient had a direct space inguinal hernia.   No evidence of any femoral obturator indirect inguinal or retropsoas  hernias.   Peritoneum dissected off the round ligament.  I opened up the hernia sac to ensure there is no evidence of any ischemia or abnormality there.  I closed the peritoneal hernia sac using 3-0 serrated V-Lock suture in a running fashion to good result.  I checked & assured hemostasis.    I reinspected the small bowel that had been incarcerated in the femoral hernia.  It had perked up and looked essentially normal except for some mild edema.  I ran the small bowel proximally and distally this region.  No injury or abnormality.  No other adhesions.  The distal small bowel was already opening up.  Reassuring.  I closed the peritoneal hernia sac defect with 3-0 serrated V-Loc absorbable suture in a running fashion.  Because there was no abscess gangrene or need for bowel resection, I decided to choose a more definitive repair with sheets of ultra-lightweight polypropylene mesh (Bard Soft mesh) 15x15cm, one for each side.  I cut a single sigmoid-shaped slit ~6cm from a corner of each mesh.  I placed the meshes into the preperitoneal space & laid them as overlapping diamonds such that at the inferior points, a 6x6 cm corner flap rested in the true anterolateral pelvis, covering the obturator & femoral foramina.   I laid the right lower quadrant mesh a little more laterally to make sure I had good overlap on the retropsoas hernia as well.  I allowed the bladder to return to the pubis, this helping tuck the corners of the mesh in the anteriolateral pelvis.  The medial corners overlapped each other across midline cephalad to the pubic rim.   Given the numerous hernias of moderate size, I placed a third 15x15cm mesh in the center as a vertical diamond.  The lateral wings of the mesh overlap across the direct spaces and femoral canals/hernias  where the dominant hernias were.  This provided good coverage and reinforcement of the hernia repairs.  Because of the central mesh placement with good overlap, I did not place  any tacks.   I held the hernia sacs cephalad & evacuated carbon dioxide.  I closed the fascia with absorbable suture.  I closed the skin using 4-0 monocryl stitch.  Sterile dressings were applied.   The patient was extubated & arrived in the PACU in stable condition..  I had discussed postoperative care with the patient in the holding area.  Instructions are written in the chart.  I discussed operative findings, updated the patient's status, discussed probable steps to recovery, and gave postoperative recommendations to the  patient's daughter, [name] .  She did rather well in the operating room but I did caution that older patients can sometimes get sicker before they get better.  Recommendations were made.  Questions were answered.  She expressed understanding & appreciation.   Adin Hector, M.D., F.A.C.S. Gastrointestinal and Minimally Invasive  [facility] [address] [place], [place] [address] [phone] Main / Paging  01/28/2021 9:58 PM

## 2021-01-28 NOTE — Progress Notes (Signed)
I called & updated the patient's status to the patient's daughter, Rebekah Bush.  I explained the need for emergency surgery since her mother has a bowel obstruction with intestine trapped in a groin hernia.  Recommendations were made.  Questions were answered.  She expressed understanding & appreciation.  I did not get the sense that they felt like her mother could not consent herself but there were concerns so I double checked with the daughter.  She agrees with mother in proceeding with surgery.  Adin Hector, MD, FACS, MASCRS Esophageal, Gastrointestinal & Colorectal Surgery Robotic and Minimally Invasive Surgery  [facility] [place]  [place] [address] [place], [place] [address] [phone] Fax [phone] Main  CONTACT INFORMATION:  Weekday (9AM-5PM): Call [facility] main office at [phone]  Weeknight (5PM-9AM) or Weekend/Holiday: Check [email or web address] (password " [email or web address]") for General Surgery [facility] coverage  (Please, do not use SecureChat as it is not reliable communication to operating surgeons for immediate patient care)

## 2021-01-28 NOTE — Anesthesia Procedure Notes (Signed)
Procedure Name: Intubation Date/Time: 01/28/2021 8:03 PM Performed by: [place] Amerie Beaumont C, CRNA Pre-anesthesia Checklist: Patient identified, Emergency Drugs available, Suction available and Patient being monitored Patient Re-evaluated:Patient Re-evaluated prior to induction Oxygen Delivery Method: Circle system utilized Preoxygenation: Pre-oxygenation with 100% oxygen Induction Type: IV induction and Rapid sequence Laryngoscope Size: Mac and 3 Grade View: Grade I Tube type: Oral Tube size: 7.0 mm Number of attempts: 1 Airway Equipment and Method: Stylet and Oral airway Placement Confirmation: ETT inserted through vocal cords under direct vision, positive ETCO2 and breath sounds checked- equal and bilateral Secured at: 21 cm Tube secured with: Tape Dental Injury: Teeth and Oropharynx as per pre-operative assessment

## 2021-01-28 NOTE — ED Notes (Signed)
Transport tech arrived to get patient for surgery. Patient unaware she was going to surgery. Called OR nurse to have surgeon talk to daughter [name]. I called [name] and she stated that she spoke with the surgeon and consented to surgery. Patient talked to her daughter and her daughter explained conversation with Psychologist, sport and exercise. Aroused patient and sat her up to be sure she was Alert and oriented and understood. Patient Alert and oriented x 4 and stated she consented to surgery. Patient to OR via stretcher with transport tech.

## 2021-01-29 ENCOUNTER — Other Ambulatory Visit: Payer: Self-pay

## 2021-01-29 ENCOUNTER — Encounter (HOSPITAL_COMMUNITY): Payer: Self-pay | Admitting: Surgery

## 2021-01-29 LAB — CBC
HCT: 38.9 % (ref 36.0–46.0)
Hemoglobin: 12.8 g/dL (ref 12.0–15.0)
MCH: 30.1 pg (ref 26.0–34.0)
MCHC: 32.9 g/dL (ref 30.0–36.0)
MCV: 91.5 fL (ref 80.0–100.0)
Platelets: 257 10*3/uL (ref 150–400)
RBC: 4.25 MIL/uL (ref 3.87–5.11)
RDW: 14.3 % (ref 11.5–15.5)
WBC: 8.8 10*3/uL (ref 4.0–10.5)
nRBC: 0 % (ref 0.0–0.2)

## 2021-01-29 LAB — BASIC METABOLIC PANEL
Anion gap: 8 (ref 5–15)
BUN: 15 mg/dL (ref 8–23)
CO2: 28 mmol/L (ref 22–32)
Calcium: 9 mg/dL (ref 8.9–10.3)
Chloride: 108 mmol/L (ref 98–111)
Creatinine, Ser: 0.76 mg/dL (ref 0.44–1.00)
GFR, Estimated: >60 mL/min (ref >60)
Glucose, Bld: 94 mg/dL (ref 70–99)
Potassium: 3.7 mmol/L (ref 3.5–5.1)
Sodium: 144 mmol/L (ref 135–145)

## 2021-01-29 LAB — MAGNESIUM: Magnesium: 1.6 mg/dL — ABNORMAL LOW (ref 1.7–2.4)

## 2021-01-29 MED ORDER — SIMETHICONE 80 MG PO CHEW: 40.0000 mg | CHEWABLE_TABLET | Freq: Four times a day (QID) | ORAL | Status: DC | PRN

## 2021-01-29 MED ORDER — LACTATED RINGERS IV BOLUS: 1000.0000 mL | Freq: Three times a day (TID) | INTRAVENOUS | Status: DC | PRN

## 2021-01-29 MED ORDER — METHOCARBAMOL 1000 MG/10ML IJ SOLN
1000.0000 mg | Freq: Four times a day (QID) | INTRAVENOUS | Status: DC | PRN
  Filled 2021-01-29: qty 10

## 2021-01-29 MED ORDER — DIPHENHYDRAMINE HCL 50 MG/ML IJ SOLN: 12.5000 mg | Freq: Four times a day (QID) | INTRAMUSCULAR | Status: DC | PRN

## 2021-01-29 MED ORDER — METRONIDAZOLE 500 MG/100ML IV SOLN
500.0000 mg | Freq: Four times a day (QID) | INTRAVENOUS | Status: DC
Start: 2021-01-29 — End: 2021-01-29
  Administered 2021-01-29: 500 mg via INTRAVENOUS
  Filled 2021-01-29: qty 100

## 2021-01-29 MED ORDER — ENALAPRILAT 1.25 MG/ML IV SOLN
0.6250 mg | Freq: Four times a day (QID) | INTRAVENOUS | Status: DC | PRN
  Filled 2021-01-29: qty 1

## 2021-01-29 MED ORDER — NAPHAZOLINE-GLYCERIN 0.012-0.25 % OP SOLN
1.0000 [drp] | Freq: Four times a day (QID) | OPHTHALMIC | Status: DC | PRN
  Filled 2021-01-29: qty 15

## 2021-01-29 MED ORDER — PHENOL 1.4 % MT LIQD: 1.0000 | OROMUCOSAL | Status: DC | PRN

## 2021-01-29 MED ORDER — METOPROLOL TARTRATE 5 MG/5ML IV SOLN
5.0000 mg | Freq: Four times a day (QID) | INTRAVENOUS | Status: DC | PRN
Start: 2021-01-29 — End: 2021-02-02

## 2021-01-29 MED ORDER — ACETAMINOPHEN 650 MG RE SUPP: 650.0000 mg | Freq: Four times a day (QID) | RECTAL | Status: DC | PRN

## 2021-01-29 MED ORDER — CEFAZOLIN SODIUM-DEXTROSE 2-4 GM/100ML-% IV SOLN
2.0000 g | Freq: Three times a day (TID) | INTRAVENOUS | Status: AC
  Administered 2021-01-29 (×2): 2 g via INTRAVENOUS
  Filled 2021-01-29 (×2): qty 100

## 2021-01-29 MED ORDER — MENTHOL 3 MG MT LOZG
1.0000 | LOZENGE | OROMUCOSAL | Status: DC | PRN
  Filled 2021-01-29: qty 9

## 2021-01-29 MED ORDER — LIP MEDEX EX OINT
1.0000 "application " | TOPICAL_OINTMENT | Freq: Two times a day (BID) | CUTANEOUS | Status: DC
  Administered 2021-01-29 – 2021-02-02 (×9): 1 via TOPICAL
  Filled 2021-01-29: qty 7

## 2021-01-29 MED ORDER — ENOXAPARIN SODIUM 40 MG/0.4ML IJ SOSY
40.0000 mg | PREFILLED_SYRINGE | INTRAMUSCULAR | Status: DC
  Administered 2021-01-29 – 2021-02-01 (×4): 40 mg via SUBCUTANEOUS
  Filled 2021-01-29 (×5): qty 0.4

## 2021-01-29 MED ORDER — FENTANYL CITRATE PF 50 MCG/ML IJ SOSY
25.0000 ug | PREFILLED_SYRINGE | INTRAMUSCULAR | Status: DC | PRN
  Administered 2021-01-29: 50 ug via INTRAVENOUS
  Filled 2021-01-29: qty 1

## 2021-01-29 MED ORDER — MAGIC MOUTHWASH
15.0000 mL | Freq: Four times a day (QID) | ORAL | Status: DC | PRN
Start: 2021-01-29 — End: 2021-02-02
  Filled 2021-01-29: qty 15

## 2021-01-29 MED ORDER — CHLORHEXIDINE GLUCONATE CLOTH 2 % EX PADS
6.0000 | MEDICATED_PAD | Freq: Every day | CUTANEOUS | Status: DC
  Administered 2021-01-29 – 2021-01-30 (×2): 6 via TOPICAL

## 2021-01-29 MED ORDER — SALINE SPRAY 0.65 % NA SOLN
1.0000 | Freq: Four times a day (QID) | NASAL | Status: DC | PRN
  Filled 2021-01-29: qty 44

## 2021-01-29 MED ORDER — MAGNESIUM HYDROXIDE 400 MG/5ML PO SUSP: 30.0000 mL | Freq: Every day | ORAL | Status: DC | PRN

## 2021-01-29 MED ORDER — LACTATED RINGERS IV SOLN: INTRAVENOUS | Status: AC

## 2021-01-29 MED ORDER — METRONIDAZOLE 500 MG/100ML IV SOLN
500.0000 mg | Freq: Four times a day (QID) | INTRAVENOUS | Status: AC
  Administered 2021-01-29 – 2021-01-30 (×2): 500 mg via INTRAVENOUS
  Filled 2021-01-29 (×2): qty 100

## 2021-01-29 MED ORDER — BISACODYL 10 MG RE SUPP: 10.0000 mg | Freq: Every day | RECTAL | Status: DC | PRN

## 2021-01-29 NOTE — Progress Notes (Signed)
Initial Nutrition Assessment  DOCUMENTATION CODES:   Non-severe (moderate) malnutrition in context of chronic illness  INTERVENTION:   Once diet advanced: -Ensure Surgery PO BID, each provides 330 kcals and 18g protein   -Recommend Mg supplementation  -If diet unable to be advanced by LOS day 6-7, recommend initiation of TPN.  NUTRITION DIAGNOSIS:   Moderate Malnutrition related to chronic illness as evidenced by severe fat depletion, moderate muscle depletion.  GOAL:   Patient will meet greater than or equal to 90% of their needs  MONITOR:   Diet advancement, Labs, Weight trends, I & O's  REASON FOR ASSESSMENT:   Malnutrition Screening Tool    ASSESSMENT:   87 y.o patient who reports decreased appetite and abdominal pain starting 10/11. Work-up concerning for incarcerated hernia and right groin.  10/12: s/p SB resection, lap femoral, hernia repair with mesh  Patient in room, very sleepy but able to speak with RD (albeit with eyes closed). Pt reports prior to abdominal pain starting on 10/11, pt was eating well with good appetite. Pt states she typically takes Mg supplements and calcium at home.  Pt with NGT in place set to suctions, output at bedside ~300 ml. Asking when she can have tube out as it causing some throat discomfort.  Will likely benefit from nutritional supplements once diet advanced. If unable to have diet advanced soon, recommend nutrition support.  Per patient, denies any weight changes.  Per weight records, pt weighed 119 lbs in April 2022.  Current weight: 115 lbs.  Medications reviewed.  Labs reviewed:  Low Mg   NUTRITION - FOCUSED PHYSICAL EXAM:  Flowsheet Row Most Recent Value  Orbital Region Moderate depletion  Upper Arm Region Severe depletion  Thoracic and Lumbar Region Unable to assess  Buccal Region Severe depletion  Temple Region Moderate depletion  Clavicle Bone Region Moderate depletion  Clavicle and Acromion Bone Region  Moderate depletion  Scapular Bone Region Moderate depletion  Dorsal Hand Severe depletion  Patellar Region Unable to assess  Anterior Thigh Region Unable to assess  Posterior Calf Region Unable to assess  Edema (RD Assessment) None       Diet Order:   Diet Order             Diet NPO time specified Except for: Ice Chips  Diet effective now                   EDUCATION NEEDS:   No education needs have been identified at this time  Skin:  Skin Assessment: Skin Integrity Issues: Skin Integrity Issues:: Incisions Incisions: 10/12 abdomen  Last BM:  10/12  Height:   Ht Readings from Last 1 Encounters:  01/28/21 5\' 1"  (1.549 m)    Weight:   Wt Readings from Last 1 Encounters:  01/28/21 52.2 kg    BMI:  Body mass index is 21.73 kg/m.  Estimated Nutritional Needs:   Kcal:  1500-1700  Protein:  65-75g  Fluid:  1.7L/day  Clayton Bibles, MS, RD, LDN Inpatient Clinical Dietitian Contact information available via Amion

## 2021-01-29 NOTE — Progress Notes (Signed)
1 Day Post-Op   Subjective/Chief Complaint: No pain this AM Feels better from abd standpoint   Objective: Vital signs in last 24 hours: Temp:  [97.6 F (36.4 C)-98.2 F (36.8 C)] 97.9 F (36.6 C) (10/13 0325) Pulse Rate:  [66-97] 73 (10/13 0325) Resp:  [16-23] 16 (10/13 0325) BP: (106-196)/(57-102) 122/59 (10/13 0325) SpO2:  [93 %-100 %] 100 % (10/13 0325) Weight:  [52.2 kg-53.5 kg] 52.2 kg (10/12 1325)    Intake/Output from previous day: 10/12 0701 - 10/13 0700 In: 2420 [I.V.:1800; NG/GT:70; IV Piggyback:550] Out: 1975 [Urine:625; Emesis/NG output:200; Blood:50] Intake/Output this shift: No intake/output data recorded.  General appearance: alert and cooperative GI: soft, non-tender; bowel sounds normal; no masses,  no organomegaly and inc c/d/i  Lab Results:  Recent Labs    01/28/21 1403 01/29/21 0621  WBC 15.2* 8.8  HGB 15.6* 12.8  HCT 47.7* 38.9  PLT 327 257   BMET Recent Labs    01/28/21 1403 01/29/21 0621  NA 142 144  K 3.5 3.7  CL 102 108  CO2 29 28  GLUCOSE 104* 94  BUN 16 15  CREATININE 0.81 0.76  CALCIUM 9.9 9.0   PT/INR No results for input(s): LABPROT, INR in the last 72 hours. ABG No results for input(s): PHART, HCO3 in the last 72 hours.  Invalid input(s): PCO2, PO2  Studies/Results: DG Abd 1 View  Result Date: 01/28/2021 CLINICAL DATA:  Postop nasogastric tube placement EXAM: ABDOMEN - 1 VIEW COMPARISON:  CT abdomen pelvis 01/28/2021 FINDINGS: Enteric tube placement coursing below the hemidiaphragm with tip and side port overlying the expected region of the gastric lumen. Tip overlies the expected region of the pylorus. Small bowel dilatation with gas. No radio-opaque calculi or other significant radiographic abnormality are seen. Subcutaneus soft tissue emphysema consistent with postsurgical changes. IMPRESSION: 1. Enteric tube in good position.  Could retract by 5 cm. 2. Small bowel dilatation with gas. 3. Subcutaneus soft tissue edema  and emphysema consistent with postsurgical changes. Electronically Signed   By: Iven Finn M.D.   On: 01/28/2021 22:54   CT Abdomen Pelvis W Contrast  Addendum Date: 01/28/2021   ADDENDUM REPORT: 01/28/2021 16:50 ADDENDUM: These results were called by telephone at the time of interpretation on 01/28/2021 at 4:48 pm to provider [facility] , who verbally acknowledged these results. Electronically Signed   By: Iven Finn M.D.   On: 01/28/2021 16:50   Result Date: 01/28/2021 CLINICAL DATA:  Abdominal abscess/infection suspected. abdominal pain RLQ x 24 hours and nausea, no rebound tenderness, soft abdomen EXAM: CT ABDOMEN AND PELVIS WITH CONTRAST TECHNIQUE: Multidetector CT imaging of the abdomen and pelvis was performed using the standard protocol following bolus administration of intravenous contrast. CONTRAST:  74mL OMNIPAQUE IOHEXOL 350 MG/ML SOLN COMPARISON:  None. FINDINGS: Lower chest: Right lower lobe traction bronchiectasis and bronchial wall thickening. No acute abnormality. Hepatobiliary: Fluid density lesions within left hepatic lobe likely represents a simple hepatic cysts. Subcentimeter hypodensities are too small to characterize. Status post cholecystectomy. No biliary dilatation. Pancreas: No focal lesion. Normal pancreatic contour. No surrounding inflammatory changes. No main pancreatic ductal dilatation. Spleen: Normal in size without focal abnormality. Adrenals/Urinary Tract: No adrenal nodule bilaterally. Bilateral kidneys enhance symmetrically. Subcentimeter hypodensities are too small to characterize. No hydronephrosis. No hydroureter. The urinary bladder is unremarkable. Stomach/Bowel: Stomach is within normal limits. Fluid dilatation of the proximal to mid small bowel with a transition point at the entrance site of a right inguinal hernia. The small bowel distal  to this is decompressed. Short segment of small bowel is noted distended with fluid within the hernia sac. Small  bowel within the mid abdomen measures up to 4.2 cm. No pneumatosis. No small bowel thickening. No evidence of large bowel wall thickening or dilatation. Appendix appears normal. Vascular/Lymphatic: No abdominal aorta or iliac aneurysm. Mild atherosclerotic plaque of the aorta and its branches. No abdominal, pelvic, or inguinal lymphadenopathy. Reproductive: Uterus and bilateral adnexa are unremarkable. Other: Trace perisplenic free fluid. No intraperitoneal free gas. No organized fluid collection. Musculoskeletal: No abdominal wall hernia or abnormality. No suspicious lytic or blastic osseous lesions. No acute displaced fracture. Multilevel severe degenerative changes of the spine. Right hip severe degenerative changes. Pubic symphysis degenerative changes. IMPRESSION: 1. High-grade small-bowel obstruction of the proximal to mid small bowel with a transition point at the entrance site of a right inguinal hernia. Fluid distension of a short loop of small bowel noted within the right inguinal hernia. A developing closed loop obstruction of the short segment of small bowel contained within the right inguinal hernia is not excluded. No findings of ischemia are noted. 2. Severe degenerative changes of the right hip. Electronically Signed: By: Iven Finn M.D. On: 01/28/2021 16:40    Anti-infectives: Anti-infectives (From admission, onward)    Start     Dose/Rate Route Frequency Ordered Stop   01/29/21 0700  ceFAZolin (ANCEF) IVPB 2g/100 mL premix        2 g 200 mL/hr over 30 Minutes Intravenous Every 8 hours 01/29/21 0608 01/29/21 2159   01/29/21 0608  metroNIDAZOLE (FLAGYL) IVPB 500 mg        500 mg 100 mL/hr over 60 Minutes Intravenous Every 6 hours 01/29/21 0608 01/30/21 0059   01/29/21 0600  ceFAZolin (ANCEF) IVPB 2g/100 mL premix       See Hyperspace for full Linked Orders Report.   2 g 200 mL/hr over 30 Minutes Intravenous On call to O.R. 01/28/21 1840 01/28/21 2004   01/29/21 0600   metroNIDAZOLE (FLAGYL) IVPB 500 mg       See Hyperspace for full Linked Orders Report.   500 mg 100 mL/hr over 60 Minutes Intravenous On call to O.R. 01/28/21 1840 01/28/21 2016   01/28/21 1909  ceFAZolin (ANCEF) 2-4 GM/100ML-% IVPB       Note to Pharmacy: [place]  : cabinet override      01/28/21 1909 01/28/21 2031   01/28/21 1908  metroNIDAZOLE (FLAGYL) 500 MG/100ML IVPB       Note to Pharmacy: [place]  : cabinet override      01/28/21 1908 01/28/21 2031       Assessment/Plan: s/p Procedure(s): LAPAROSCOPIC FEMORAL  HERNIA REPAIR WITH MESH (Right) SMALL BOWEL RESECTION, LAPAROSCOPIC (N/A) -Con't NGT for 24h, ice chips OK -mobilize -await bowel fxn  LOS: 1 day    Ralene Ok 01/29/2021

## 2021-01-29 NOTE — Evaluation (Signed)
Occupational Therapy Evaluation Patient Details Name: Rebekah Bush MRN: [id number] DOB: Dec 07, 1929 Today's Date: 01/29/2021   History of Present Illness 87 yo female s/p laparoscopic R femoral hernia repair with mesh, laparoscopic small bowel resction on 10/12. PMH includes BCC, gout, HTN, retinal detachment repair.   Clinical Impression   PTA, pt was living alone, independent in ADLs with use of cane, and required assistance from her two daughters for obtaining groceries. Upon evaluation, pt with decreased balance and increased fall risk. Pt currently requiring min guard for functional mobility with RW, min A for LB ADLs, and set up for UB ADLs. Pt educated on compensatory strategies to avoid bending for pain management during ADLs and bed mobility, pt declining education on sock aid (reports she has one and knows how to use it) Patient will benefit from skilled OT services while in hospital to improve deficits and learn compensatory strategies as needed in order to return to PLOF. Recommending SNF rehab, however, may update to [facility] pending pt progress and available assistance from daughters.      Recommendations for follow up therapy are one component of a multi-disciplinary discharge planning process, led by the attending physician.  Recommendations may be updated based on patient status, additional functional criteria and insurance authorization.   Follow Up Recommendations  SNF   Equipment Recommendations  Other (comment) (TBD)    Recommendations for Other Services       Precautions / Restrictions Precautions Precautions: Fall Restrictions Weight Bearing Restrictions: No      Mobility Bed Mobility Overal bed mobility: Needs Assistance Bed Mobility: Supine to Sit;Sit to Supine Rolling: Min assist   Supine to sit: Supervision Sit to supine: Supervision Sit to sidelying: Min assist General bed mobility comments: Recommended log rolling, pt declining and performed  supine<>sit    Transfers Overall transfer level: Needs assistance Equipment used: Rolling walker (2 wheeled) Transfers: Sit to/from Stand Sit to Stand: Min assist         General transfer comment: min A for correcting LOB when pt reached behing to simulate toilet hygiene with one UE support on RW.    Balance Overall balance assessment: Needs assistance;History of Falls Sitting-balance support: No upper extremity supported;Feet supported Sitting balance-Leahy Scale: Good     Standing balance support: Bilateral upper extremity supported;During functional activity Standing balance-Leahy Scale: Poor Standing balance comment: reliant on device                           ADL either performed or assessed with clinical judgement   ADL Overall ADL's : Needs assistance/impaired Eating/Feeding: Set up;Sitting   Grooming: Set up;Sitting   Upper Body Bathing: Sitting;Set up   Lower Body Bathing: Sit to/from stand;Minimal assistance   Upper Body Dressing : Set up;Sitting   Lower Body Dressing: Sit to/from stand;Minimal assistance Lower Body Dressing Details (indicate cue type and reason): donned socks EOB, able to reach to feet. Unable to stand without BUE external assist Toilet Transfer: Min guard;RW   Toileting- Clothing Manipulation and Hygiene: Minimal assistance;Sit to/from stand Toileting - Clothing Manipulation Details (indicate cue type and reason): unable to remain balanced without BUE support on RW during simulated toileting hygiene.     Functional mobility during ADLs: Min guard;Rolling walker       Vision Patient Visual Report: No change from baseline       Perception     Praxis      Pertinent Vitals/Pain Pain Assessment: Faces Faces  Pain Scale: Hurts a little bit Pain Location: R groin, abdomen Pain Descriptors / Indicators: Sore;Discomfort Pain Intervention(s): Monitored during session     Hand Dominance Right   Extremity/Trunk Assessment  Upper Extremity Assessment Upper Extremity Assessment: RUE deficits/detail;LUE deficits/detail RUE Deficits / Details: Shoulder flexion 4-/5, elbow flexion, extension, grip 4+/5. With decreased functional use of RUE (trigger finger per chart). Able to pull up socks using RUE to assist. LUE Deficits / Details: Shoulder flexion 4-/5, elbow flexion, extension, grip 4+/5   Lower Extremity Assessment Lower Extremity Assessment: Defer to PT evaluation   Cervical / Trunk Assessment Cervical / Trunk Assessment: Kyphotic   Communication Communication Communication: No difficulties   Cognition Arousal/Alertness: Awake/alert Behavior During Therapy: WFL for tasks assessed/performed Overall Cognitive Status: Within Functional Limits for tasks assessed                                 General Comments: Required increased time.   General Comments  NGT disconnected during mobility, reconnected on intermittent suction at end of session    Exercises     Shoulder Instructions      Home Living Family/patient expects to be discharged to:: Private residence Living Arrangements: Alone Available Help at Discharge: Family Type of Home: Independent living facility Home Access: Level entry     [place]: One level     Bathroom Shower/Tub: Occupational psychologist: [place] - single point;Other (comment) (sock aid)          Prior Functioning/Environment Level of Independence: Independent with assistive device(s)        Comments: Pt reports using cane for mobility, endorses fall but not recently. Supposed to have hand surgery November of this year. Independent in ADLs and IADLs aside from having a person come into clean, no longer driving, daughter gets groceries for her.        OT Problem List: Decreased strength;Impaired balance (sitting and/or standing);Pain      OT Treatment/Interventions: Self-care/ADL training;Therapeutic  exercise;Energy conservation;DME and/or AE instruction;Therapeutic activities;Patient/family education;Balance training    OT Goals(Current goals can be found in the care plan section) Acute Rehab OT Goals Patient Stated Goal: home if able OT Goal Formulation: With patient Time For Goal Achievement: 02/12/21 Potential to Achieve Goals: Good  OT Frequency: Min 2X/week   Barriers to D/C:            Co-evaluation              AM-PAC OT "6 Clicks" Daily Activity     Outcome Measure Help from another person eating meals?: A Little Help from another person taking care of personal grooming?: A Little Help from another person toileting, which includes using toliet, bedpan, or urinal?: A Little Help from another person bathing (including washing, rinsing, drying)?: A Little Help from another person to put on and taking off regular upper body clothing?: A Little Help from another person to put on and taking off regular lower body clothing?: A Little 6 Click Score: 18   End of Session Equipment Utilized During Treatment: Rolling walker Nurse Communication: Mobility status  Activity Tolerance: Patient tolerated treatment well Patient left: in bed;with bed alarm set;with call bell/phone within reach  OT Visit Diagnosis: Pain;Unsteadiness on feet (R26.81);History of falling (Z91.81);Muscle weakness (generalized) (M62.81);Other abnormalities of gait and mobility (R26.89)  Time: 5456-2563 OT Time Calculation (min): 27 min Charges:  OT General Charges $OT Visit: 1 Visit OT Evaluation $OT Eval Low Complexity: 1 Low OT Treatments $Self Care/Home Management : 8-22 mins  Jackquline Denmark, OTS Acute Rehab Office: [phone]   Breezy Hertenstein 01/29/2021, 3:28 PM

## 2021-01-29 NOTE — Evaluation (Signed)
Physical Therapy Evaluation Patient Details Name: Rebekah Bush MRN: [id number] DOB: August 14, 1929 Today's Date: 01/29/2021  History of Present Illness  87 yo female s/p laparoscopic R femoral hernia repair with mesh, laparoscopic small bowel resction on 10/12. PMH includes BCC, gout, HTN, retinal detachment repair.  Clinical Impression   Pt presents with generalized weakness, impaired balance with history of falls, abdominal and R groin pain post-operatively, and impaired activity tolerance vs baseline. Pt to benefit from acute PT to address deficits. Pt ambulated short hallway distance with use of RW and close guard, requiring some physical assist for transfers and in/out of bed. PT discussed follow up with pt, recommending home with increased support from daughters, but pt is unsure if she will have increased support. Presently recommending ST-SNF until increased home support is confirmed. PT to progress mobility as tolerated, and will continue to follow acutely.         Recommendations for follow up therapy are one component of a multi-disciplinary discharge planning process, led by the attending physician.  Recommendations may be updated based on patient status, additional functional criteria and insurance authorization.  Follow Up Recommendations SNF (vs home with HHPT and increased support from daughters)    Equipment Recommendations  Rolling walker with 5" wheels    Recommendations for Other Services       Precautions / Restrictions Precautions Precautions: Fall Restrictions Weight Bearing Restrictions: No      Mobility  Bed Mobility Overal bed mobility: Needs Assistance Bed Mobility: Sit to Sidelying;Rolling Rolling: Min assist       Sit to sidelying: Min assist General bed mobility comments: min assist for log roll back into bed for LE lifting, completing truncal translation.    Transfers Overall transfer level: Needs assistance Equipment used: Rolling walker (2  wheeled) Transfers: Sit to/from Stand Sit to Stand: Min assist         General transfer comment: min assist for power up and steadying. Cues for hand placement when rising/sitting.  Ambulation/Gait Ambulation/Gait assistance: Min guard Gait Distance (Feet): 80 Feet Assistive device: Rolling walker (2 wheeled) Gait Pattern/deviations: Step-through pattern;Decreased stride length;Trunk flexed Gait velocity: decr   General Gait Details: close guard for safety, pt with heavily forward flexed posture due to abdominal/groin pain.  Stairs            Wheelchair Mobility    Modified Rankin (Stroke Patients Only)       Balance Overall balance assessment: Needs assistance;History of Falls Sitting-balance support: No upper extremity supported;Feet supported Sitting balance-Leahy Scale: Fair     Standing balance support: Bilateral upper extremity supported;During functional activity Standing balance-Leahy Scale: Poor Standing balance comment: reliant on device                             Pertinent Vitals/Pain Pain Assessment: Faces Faces Pain Scale: Hurts little more Pain Location: R groin, abdomen Pain Descriptors / Indicators: Sore;Discomfort Pain Intervention(s): Limited activity within patient's tolerance;Monitored during session;Repositioned    Home Living Family/patient expects to be discharged to:: Private residence Living Arrangements: Alone Available Help at Discharge: Family Type of Home: Independent living facility Home Access: Level entry     Home Layout: One level Home Equipment: Cane - single point      Prior Function Level of Independence: Independent with assistive device(s)         Comments: pt reports using cane for mobility, endorses fall but not recently. Pt states she does everything  for self, was driving until her hand started bothering her, supposed to have hand surgery November of this year.     Hand Dominance   Dominant  Hand: Right    Extremity/Trunk Assessment   Upper Extremity Assessment Upper Extremity Assessment: Defer to OT evaluation    Lower Extremity Assessment Lower Extremity Assessment: Generalized weakness    Cervical / Trunk Assessment Cervical / Trunk Assessment: Kyphotic  Communication   Communication: No difficulties  Cognition Arousal/Alertness: Awake/alert Behavior During Therapy: WFL for tasks assessed/performed Overall Cognitive Status: Within Functional Limits for tasks assessed                                        General Comments General comments (skin integrity, edema, etc.): NGT disconnected during mobility, reconnected on intermittent suction at end of session    Exercises     Assessment/Plan    PT Assessment Patient needs continued PT services  PT Problem List Decreased strength;Decreased mobility;Decreased balance;Decreased knowledge of use of DME;Pain;Decreased activity tolerance;Decreased safety awareness       PT Treatment Interventions DME instruction;Therapeutic activities;Gait training;Therapeutic exercise;Patient/family education;Balance training;Functional mobility training    PT Goals (Current goals can be found in the Care Plan section)  Acute Rehab PT Goals Patient Stated Goal: home if able PT Goal Formulation: With patient Time For Goal Achievement: 02/12/21 Potential to Achieve Goals: Good    Frequency Min 3X/week   Barriers to discharge Decreased caregiver support      Co-evaluation               AM-PAC PT "6 Clicks" Mobility  Outcome Measure Help needed turning from your back to your side while in a flat bed without using bedrails?: A Little Help needed moving from lying on your back to sitting on the side of a flat bed without using bedrails?: A Little Help needed moving to and from a bed to a chair (including a wheelchair)?: A Little Help needed standing up from a chair using your arms (e.g., wheelchair or  bedside chair)?: A Little Help needed to walk in hospital room?: A Little Help needed climbing 3-5 steps with a railing? : A Little 6 Click Score: 18    End of Session   Activity Tolerance: Patient tolerated treatment well Patient left: in bed;with call bell/phone within reach;with bed alarm set Nurse Communication: Mobility status PT Visit Diagnosis: Other abnormalities of gait and mobility (R26.89);Muscle weakness (generalized) (M62.81)    Time: 1660-6301 PT Time Calculation (min) (ACUTE ONLY): 19 min   Charges:   PT Evaluation $PT Eval Low Complexity: 1 Low         Kiaira Pointer S, PT DPT Acute Rehabilitation Services Pager [phone]  Office [phone]   [place] E Ruffin Pyo 01/29/2021, 1:41 PM

## 2021-01-29 NOTE — Plan of Care (Signed)
  Problem: Education: Goal: Knowledge of General Education information will improve Description: Including pain rating scale, medication(s)/side effects and non-pharmacologic comfort measures Outcome: Progressing   Problem: Elimination: Goal: Will not experience complications related to urinary retention Outcome: Progressing   Problem: Pain Managment: Goal: General experience of comfort will improve Outcome: Progressing   

## 2021-01-29 NOTE — Progress Notes (Signed)
Pt is sleeping, not alert enough to be able to answer admission questions. Daughter could not be contacted.

## 2021-01-29 NOTE — Anesthesia Postprocedure Evaluation (Signed)
Anesthesia Post Note  Patient: Rebekah Bush  Procedure(s) Performed: LAPAROSCOPIC FEMORAL  HERNIA REPAIR WITH MESH (Right: Abdomen) SMALL BOWEL RESECTION, LAPAROSCOPIC (Abdomen)     Patient location during evaluation: PACU Anesthesia Type: General Level of consciousness: awake and alert Pain management: pain level controlled Vital Signs Assessment: post-procedure vital signs reviewed and stable Respiratory status: spontaneous breathing, nonlabored ventilation, respiratory function stable and patient connected to nasal cannula oxygen Cardiovascular status: blood pressure returned to baseline and stable Postop Assessment: no apparent nausea or vomiting Anesthetic complications: no   No notable events documented.  Last Vitals:  Vitals:   01/29/21 0112 01/29/21 0225  BP: 135/70 (!) 106/57  Pulse: 82 78  Resp: 17 17  Temp: 36.6 C 36.5 C  SpO2: 100% 100%    Last Pain:  Vitals:   01/29/21 0225  TempSrc: Oral  PainSc:                  Theordore Cisnero S

## 2021-01-30 DIAGNOSIS — E440 Moderate protein-calorie malnutrition: Secondary | ICD-10-CM | POA: Insufficient documentation

## 2021-01-30 LAB — CBC
HCT: 36.5 % (ref 36.0–46.0)
Hemoglobin: 12 g/dL (ref 12.0–15.0)
MCH: 29.8 pg (ref 26.0–34.0)
MCHC: 32.9 g/dL (ref 30.0–36.0)
MCV: 90.6 fL (ref 80.0–100.0)
Platelets: 260 10*3/uL (ref 150–400)
RBC: 4.03 MIL/uL (ref 3.87–5.11)
RDW: 14 % (ref 11.5–15.5)
WBC: 9.6 10*3/uL (ref 4.0–10.5)
nRBC: 0 % (ref 0.0–0.2)

## 2021-01-30 LAB — BASIC METABOLIC PANEL
Anion gap: 6 (ref 5–15)
BUN: 14 mg/dL (ref 8–23)
CO2: 28 mmol/L (ref 22–32)
Calcium: 8.2 mg/dL — ABNORMAL LOW (ref 8.9–10.3)
Chloride: 101 mmol/L (ref 98–111)
Creatinine, Ser: 0.69 mg/dL (ref 0.44–1.00)
GFR, Estimated: >60 mL/min (ref >60)
Glucose, Bld: 67 mg/dL — ABNORMAL LOW (ref 70–99)
Potassium: 2.7 mmol/L — CL (ref 3.5–5.1)
Sodium: 135 mmol/L (ref 135–145)

## 2021-01-30 LAB — MAGNESIUM: Magnesium: 1.6 mg/dL — ABNORMAL LOW (ref 1.7–2.4)

## 2021-01-30 MED ORDER — ONDANSETRON HCL 4 MG/2ML IJ SOLN
4.0000 mg | Freq: Four times a day (QID) | INTRAMUSCULAR | Status: DC | PRN
  Administered 2021-01-31: 4 mg via INTRAVENOUS
  Filled 2021-01-30: qty 2

## 2021-01-30 MED ORDER — METOPROLOL SUCCINATE ER 50 MG PO TB24
100.0000 mg | ORAL_TABLET | Freq: Every day | ORAL | Status: DC
  Administered 2021-01-30 – 2021-02-02 (×4): 100 mg via ORAL
  Filled 2021-01-30 (×5): qty 2

## 2021-01-30 MED ORDER — POLYETHYLENE GLYCOL 3350 17 G PO PACK: 17.0000 g | PACK | Freq: Every day | ORAL | Status: DC | PRN

## 2021-01-30 MED ORDER — LEVOTHYROXINE SODIUM 75 MCG PO TABS
75.0000 ug | ORAL_TABLET | Freq: Every day | ORAL | Status: DC
  Administered 2021-01-30 – 2021-02-02 (×4): 75 ug via ORAL
  Filled 2021-01-30 (×4): qty 1

## 2021-01-30 MED ORDER — OXYCODONE HCL 5 MG PO TABS
2.5000 mg | ORAL_TABLET | ORAL | Status: DC | PRN
Start: 2021-01-30 — End: 2021-02-02

## 2021-01-30 MED ORDER — DOCUSATE SODIUM 100 MG PO CAPS
100.0000 mg | ORAL_CAPSULE | Freq: Two times a day (BID) | ORAL | Status: DC
  Administered 2021-01-31: 100 mg via ORAL
  Filled 2021-01-30 (×3): qty 1

## 2021-01-30 MED ORDER — HYDRALAZINE HCL 50 MG PO TABS
50.0000 mg | ORAL_TABLET | Freq: Two times a day (BID) | ORAL | Status: DC
  Administered 2021-01-30 – 2021-02-02 (×7): 50 mg via ORAL
  Filled 2021-01-30 (×7): qty 1

## 2021-01-30 MED ORDER — POTASSIUM CHLORIDE 10 MEQ/100ML IV SOLN
10.0000 meq | INTRAVENOUS | Status: AC
  Administered 2021-01-30 (×5): 10 meq via INTRAVENOUS
  Filled 2021-01-30 (×5): qty 100

## 2021-01-30 MED ORDER — POTASSIUM CHLORIDE 20 MEQ PO PACK
40.0000 meq | PACK | Freq: Two times a day (BID) | ORAL | Status: AC
  Administered 2021-01-30 (×2): 40 meq via ORAL
  Filled 2021-01-30 (×3): qty 2

## 2021-01-30 MED ORDER — LISINOPRIL 20 MG PO TABS
20.0000 mg | ORAL_TABLET | Freq: Every day | ORAL | Status: DC
  Administered 2021-01-31 – 2021-02-02 (×3): 20 mg via ORAL
  Filled 2021-01-30 (×3): qty 1

## 2021-01-30 MED ORDER — MAGNESIUM SULFATE IN D5W 1-5 GM/100ML-% IV SOLN
1.0000 g | Freq: Once | INTRAVENOUS | Status: AC
  Administered 2021-01-30: 1 g via INTRAVENOUS
  Filled 2021-01-30: qty 100

## 2021-01-30 MED ORDER — BOOST / RESOURCE BREEZE PO LIQD CUSTOM
1.0000 | Freq: Two times a day (BID) | ORAL | Status: DC
Start: 2021-01-30 — End: 2021-02-02
  Administered 2021-01-30 – 2021-02-02 (×6): 1 via ORAL

## 2021-01-30 MED ORDER — QUINAPRIL HCL 10 MG PO TABS
20.0000 mg | ORAL_TABLET | Freq: Every evening | ORAL | Status: DC
  Filled 2021-01-30 (×2): qty 2

## 2021-01-30 NOTE — Progress Notes (Signed)
Patient ID: Rebekah Bush, female   DOB: 04-16-1930, 87 y.o.   MRN: [id number] [facility] Surgery Progress Note  2 Days Post-Op  Subjective: CC-  Family at bedside. Abdomen a little sore but not taking any pain medication. Passing flatus, no BM. NG with low output.  Objective: Vital signs in last 24 hours: Temp:  [98 F (36.7 C)-98.9 F (37.2 C)] 98.4 F (36.9 C) (10/14 0623) Pulse Rate:  [85-107] 85 (10/14 0623) Resp:  [16-18] 18 (10/14 0623) BP: (144-190)/(76-102) 154/78 (10/14 0623) SpO2:  [91 %-97 %] 93 % (10/14 0623) Last BM Date: 01/28/21  Intake/Output from previous day: 10/13 0701 - 10/14 0700 In: 1749.6 [I.V.:1349.5; IV Piggyback:400.1] Out: 2580 [Urine:2380; Emesis/NG output:200] Intake/Output this shift: No intake/output data recorded.  PE: Gen:  Alert, NAD, pleasant Pulm:  rate and effort normal Abd: Soft, mild distension, +BS, incisions cdi with some ecchymosis but no erythema or drainage  Lab Results:  Recent Labs    01/29/21 0621 01/30/21 0733  WBC 8.8 9.6  HGB 12.8 12.0  HCT 38.9 36.5  PLT 257 260   BMET Recent Labs    01/29/21 0621 01/30/21 0733  NA 144 135  K 3.7 2.7*  CL 108 101  CO2 28 28  GLUCOSE 94 67*  BUN 15 14  CREATININE 0.76 0.69  CALCIUM 9.0 8.2*   PT/INR No results for input(s): LABPROT, INR in the last 72 hours. CMP     Component Value Date/Time   NA 135 01/30/2021 0733   K 2.7 (LL) 01/30/2021 0733   CL 101 01/30/2021 0733   CO2 28 01/30/2021 0733   GLUCOSE 67 (L) 01/30/2021 0733   BUN 14 01/30/2021 0733   CREATININE 0.69 01/30/2021 0733   CALCIUM 8.2 (L) 01/30/2021 0733   PROT 7.0 01/28/2021 1403   ALBUMIN 3.6 01/28/2021 1403   AST 20 01/28/2021 1403   ALT 14 01/28/2021 1403   ALKPHOS 79 01/28/2021 1403   BILITOT 0.9 01/28/2021 1403   GFRNONAA >60 01/30/2021 0733   GFRAA >60 05/08/2019 0500   Lipase     Component Value Date/Time   LIPASE 25 01/28/2021 1403       Studies/Results: DG Abd 1  View  Result Date: 01/28/2021 CLINICAL DATA:  Postop nasogastric tube placement EXAM: ABDOMEN - 1 VIEW COMPARISON:  CT abdomen pelvis 01/28/2021 FINDINGS: Enteric tube placement coursing below the hemidiaphragm with tip and side port overlying the expected region of the gastric lumen. Tip overlies the expected region of the pylorus. Small bowel dilatation with gas. No radio-opaque calculi or other significant radiographic abnormality are seen. Subcutaneus soft tissue emphysema consistent with postsurgical changes. IMPRESSION: 1. Enteric tube in good position.  Could retract by 5 cm. 2. Small bowel dilatation with gas. 3. Subcutaneus soft tissue edema and emphysema consistent with postsurgical changes. Electronically Signed   By: Iven Finn M.D.   On: 01/28/2021 22:54   CT Abdomen Pelvis W Contrast  Addendum Date: 01/28/2021   ADDENDUM REPORT: 01/28/2021 16:50 ADDENDUM: These results were called by telephone at the time of interpretation on 01/28/2021 at 4:48 pm to provider [facility] , who verbally acknowledged these results. Electronically Signed   By: Iven Finn M.D.   On: 01/28/2021 16:50   Result Date: 01/28/2021 CLINICAL DATA:  Abdominal abscess/infection suspected. abdominal pain RLQ x 24 hours and nausea, no rebound tenderness, soft abdomen EXAM: CT ABDOMEN AND PELVIS WITH CONTRAST TECHNIQUE: Multidetector CT imaging of the abdomen and pelvis was performed using  the standard protocol following bolus administration of intravenous contrast. CONTRAST:  47mL OMNIPAQUE IOHEXOL 350 MG/ML SOLN COMPARISON:  None. FINDINGS: Lower chest: Right lower lobe traction bronchiectasis and bronchial wall thickening. No acute abnormality. Hepatobiliary: Fluid density lesions within left hepatic lobe likely represents a simple hepatic cysts. Subcentimeter hypodensities are too small to characterize. Status post cholecystectomy. No biliary dilatation. Pancreas: No focal lesion. Normal pancreatic contour.  No surrounding inflammatory changes. No main pancreatic ductal dilatation. Spleen: Normal in size without focal abnormality. Adrenals/Urinary Tract: No adrenal nodule bilaterally. Bilateral kidneys enhance symmetrically. Subcentimeter hypodensities are too small to characterize. No hydronephrosis. No hydroureter. The urinary bladder is unremarkable. Stomach/Bowel: Stomach is within normal limits. Fluid dilatation of the proximal to mid small bowel with a transition point at the entrance site of a right inguinal hernia. The small bowel distal to this is decompressed. Short segment of small bowel is noted distended with fluid within the hernia sac. Small bowel within the mid abdomen measures up to 4.2 cm. No pneumatosis. No small bowel thickening. No evidence of large bowel wall thickening or dilatation. Appendix appears normal. Vascular/Lymphatic: No abdominal aorta or iliac aneurysm. Mild atherosclerotic plaque of the aorta and its branches. No abdominal, pelvic, or inguinal lymphadenopathy. Reproductive: Uterus and bilateral adnexa are unremarkable. Other: Trace perisplenic free fluid. No intraperitoneal free gas. No organized fluid collection. Musculoskeletal: No abdominal wall hernia or abnormality. No suspicious lytic or blastic osseous lesions. No acute displaced fracture. Multilevel severe degenerative changes of the spine. Right hip severe degenerative changes. Pubic symphysis degenerative changes. IMPRESSION: 1. High-grade small-bowel obstruction of the proximal to mid small bowel with a transition point at the entrance site of a right inguinal hernia. Fluid distension of a short loop of small bowel noted within the right inguinal hernia. A developing closed loop obstruction of the short segment of small bowel contained within the right inguinal hernia is not excluded. No findings of ischemia are noted. 2. Severe degenerative changes of the right hip. Electronically Signed: By: Iven Finn M.D. On:  01/28/2021 16:40    Anti-infectives: Anti-infectives (From admission, onward)    Start     Dose/Rate Route Frequency Ordered Stop   01/29/21 1800  metroNIDAZOLE (FLAGYL) IVPB 500 mg        500 mg 100 mL/hr over 60 Minutes Intravenous Every 6 hours 01/29/21 1254 01/30/21 0142   01/29/21 0700  ceFAZolin (ANCEF) IVPB 2g/100 mL premix        2 g 200 mL/hr over 30 Minutes Intravenous Every 8 hours 01/29/21 0608 01/29/21 1616   01/29/21 0608  metroNIDAZOLE (FLAGYL) IVPB 500 mg  Status:  Discontinued        500 mg 100 mL/hr over 60 Minutes Intravenous Every 6 hours 01/29/21 0608 01/29/21 1254   01/29/21 0600  ceFAZolin (ANCEF) IVPB 2g/100 mL premix       See Hyperspace for full Linked Orders Report.   2 g 200 mL/hr over 30 Minutes Intravenous On call to O.R. 01/28/21 1840 01/28/21 2004   01/29/21 0600  metroNIDAZOLE (FLAGYL) IVPB 500 mg       See Hyperspace for full Linked Orders Report.   500 mg 100 mL/hr over 60 Minutes Intravenous On call to O.R. 01/28/21 1840 01/28/21 2016   01/28/21 1909  ceFAZolin (ANCEF) 2-4 GM/100ML-% IVPB       Note to Pharmacy: [place]  : cabinet override      01/28/21 1909 01/28/21 2031   01/28/21 1908  metroNIDAZOLE (FLAGYL) 500  MG/100ML IVPB       Note to Pharmacy: [place]  : cabinet override      01/28/21 1908 01/28/21 2031        Assessment/Plan INCARCERATED RIGHT FEMORAL HERNIA -POD#2 S/p LAPAROSCOPIC FEMORAL HERNIA REPAIR WITH MESH; LAPAROSCOPIC BILATERAL INGUINAL HERNIA REPAIR WITH MESH; LAPAROSCOPIC RETROPSOAS HERNIA REPAIR WITH MESH; TRANSVERSUS ABDOMINIS PLANE (TAP) BLOCK - BILATERAL 10/12 Dr. Johney Maine - D/c NG tube and advance to clear liquids - mobilize, continue therapies - recommending SNF vs HHPT with increased family support. Will see how she progresses - replace K and Mag  ID - none currently FEN - decrease IVF 50cc/hr, CLD, Boost VTE - lovenox Foley - d/c 10/14  HTN - restart home meds Hypothyroidism - home  synthroid Gout   LOS: 2 days    [place], [facility] Surgery 01/30/2021, 10:21 AM Please see Amion for pager number during day hours 7:00am-4:30pm

## 2021-01-30 NOTE — Progress Notes (Signed)
PT Cancellation Note  Patient Details Name: Rebekah Bush MRN: [id number] DOB: July 23, 1929   Cancelled Treatment:     Critical Lab value K+ 2.7 this am then sleeping comfortably pm.      Nathanial Rancher 01/30/2021, 2:59 PM

## 2021-01-30 NOTE — Progress Notes (Signed)
Lab called with critical value of potassium at 2.7. Dr. Rosendo Gros was paged with information.

## 2021-01-30 NOTE — TOC Initial Note (Signed)
Transition of Care [facility]) - Initial/Assessment Note   Patient Details  Name: Rebekah Bush MRN: [id number] Date of Birth: [birth year]  Transition of Care [facility]) CM/SW Contact:    Sherie Don, LCSW Phone Number: 01/30/2021, 10:46 AM  Clinical Narrative: PT and OT evaluations recommended SNF vs. HHPT if patient continues to improve. CSW spoke with patient regarding recommendations. Patient reported she would prefer to discharge back to [facility] and get HHPT and OT on site from [facility] rather than going to a facility for short-term rehab.  CSW spoke with Rollene Fare with [facility] to discuss patient's evaluations and to determine if patient can return. Per Rollene Fare, patient is walking far enough that she can come back to [facility] and resume PT and OT as she was receiving these services prior to admission. TOC to follow.  Expected Discharge Plan: [place] (PT and OT will be provided through [facility] at patient's independent living facility.) Barriers to Discharge: Continued Medical Work up  Patient Goals and CMS Choice Patient states their goals for this hospitalization and ongoing recovery are:: Return to [email or web address].gov Compare Post Acute Care list provided to:: Patient Choice offered to / list presented to : Patient  Expected Discharge Plan and Services Expected Discharge Plan: [place] (PT and OT will be provided through [facility] at patient's independent living facility.) In-house Referral: Clinical Social Work Post Acute Care Choice: [place] arrangements for the past 2 months: [place]              DME Arranged: N/A DME Agency: NA HH Arranged: PT, OT [place] Agency: Other - See comment Secondary school teacher provides [place] at [place]) Date [place]: 01/30/21 Representative spoke with at [place]: Rollene Fare  Prior Living Arrangements/Services Living arrangements for the past 2 months: Materials engineer Lives with::  Self Patient language and need for interpreter reviewed:: Yes Do you feel safe going back to the place where you live?: Yes      Need for Family Participation in Patient Care: No (Comment) Care giver support system in place?: Yes (comment) Criminal Activity/Legal Involvement Pertinent to Current Situation/Hospitalization: No - Comment as needed  Activities of Daily Living Home Assistive Devices/Equipment: Eyeglasses, Cane (specify quad or straight) ADL Screening (condition at time of admission) Patient's cognitive ability adequate to safely complete daily activities?: Yes Is the patient deaf or have difficulty hearing?: No Does the patient have difficulty seeing, even when wearing glasses/contacts?: No Does the patient have difficulty concentrating, remembering, or making decisions?: No Patient able to express need for assistance with ADLs?: Yes Does the patient have difficulty dressing or bathing?: No Independently performs ADLs?: Yes (appropriate for developmental age) Does the patient have difficulty walking or climbing stairs?: Yes Weakness of Legs: Both Weakness of Arms/Hands: Both  Permission Sought/Granted Permission sought to share information with : Facility Art therapist granted to share information with : Yes, Verbal Permission Granted Permission granted to share info w AGENCY: Rollene Fare at [employer]  Emotional Assessment Attitude/Demeanor/Rapport: Engaged Affect (typically observed): Accepting Orientation: : Oriented to Self, Oriented to Place, Oriented to  Time, Oriented to Situation Alcohol / Substance Use: Not Applicable Psych Involvement: No (comment)  Admission diagnosis:  SBO (small bowel obstruction) ([place]) [K56.609] Incarcerated femoral hernia [K41.30] Incarcerated hernia [K46.0] Patient Active Problem List   Diagnosis Date Noted   Malnutrition of moderate degree 01/30/2021   Incarcerated femoral hernia 01/28/2021   Gout 01/28/2021    History of fracture due  to fall 01/28/2021   SBO (small bowel obstruction) ([place]) 01/28/2021   Arthritis of hand 01/01/2021   Generalized osteoarthritis 07/30/2020   Positive ANA (antinuclear antibody) 07/30/2020   Trigger finger, right ring finger 07/30/2020   Bilateral hand pain 07/30/2020   Pain in right hip 07/30/2020   Rash and other nonspecific skin eruption 07/30/2020   Advanced nonexudative age-related macular degeneration of right eye with subfoveal involvement 08/15/2019   Intermediate stage nonexudative age-related macular degeneration of left eye 08/01/2019   Right epiretinal membrane 08/01/2019   Fall at home, initial encounter 05/07/2019   Hypothyroidism 05/07/2019   Prolonged QT interval 05/07/2019   PCP:  Roetta Sessions, NP Pharmacy:   [id number] [address], [place] [address] Phone: [phone] Fax: [phone]  [id number] [place], [place] [address] [place] [address] Phone: [phone] Fax: [phone]  Readmission Risk Interventions No flowsheet data found.

## 2021-01-31 LAB — BASIC METABOLIC PANEL
Anion gap: 8 (ref 5–15)
BUN: 11 mg/dL (ref 8–23)
CO2: 24 mmol/L (ref 22–32)
Calcium: 8.2 mg/dL — ABNORMAL LOW (ref 8.9–10.3)
Chloride: 104 mmol/L (ref 98–111)
Creatinine, Ser: 0.64 mg/dL (ref 0.44–1.00)
GFR, Estimated: >60 mL/min (ref >60)
Glucose, Bld: 72 mg/dL (ref 70–99)
Potassium: 4.7 mmol/L (ref 3.5–5.1)
Sodium: 136 mmol/L (ref 135–145)

## 2021-01-31 LAB — MAGNESIUM: Magnesium: 1.7 mg/dL (ref 1.7–2.4)

## 2021-01-31 MED ORDER — MAGNESIUM OXIDE -MG SUPPLEMENT 400 (240 MG) MG PO TABS
400.0000 mg | ORAL_TABLET | Freq: Every day | ORAL | Status: DC
  Administered 2021-01-31 – 2021-02-02 (×3): 400 mg via ORAL
  Filled 2021-01-31 (×3): qty 1

## 2021-01-31 MED ORDER — SODIUM CHLORIDE 0.9% FLUSH: 3.0000 mL | INTRAVENOUS | Status: DC | PRN

## 2021-01-31 MED ORDER — MAGNESIUM SULFATE 2 GM/50ML IV SOLN
2.0000 g | Freq: Once | INTRAVENOUS | Status: AC
  Administered 2021-01-31: 2 g via INTRAVENOUS
  Filled 2021-01-31: qty 50

## 2021-01-31 MED ORDER — COLCHICINE 0.6 MG PO CAPS: 0.6000 mg | ORAL_CAPSULE | Freq: Every day | ORAL | Status: DC

## 2021-01-31 MED ORDER — CALCIUM POLYCARBOPHIL 625 MG PO TABS
625.0000 mg | ORAL_TABLET | Freq: Two times a day (BID) | ORAL | Status: DC
  Administered 2021-01-31 – 2021-02-02 (×5): 625 mg via ORAL
  Filled 2021-01-31 (×5): qty 1

## 2021-01-31 MED ORDER — ALBUMIN HUMAN 5 % IV SOLN: 12.5000 g | Freq: Four times a day (QID) | INTRAVENOUS | Status: DC | PRN

## 2021-01-31 MED ORDER — ACETAMINOPHEN 500 MG PO TABS
500.0000 mg | ORAL_TABLET | Freq: Three times a day (TID) | ORAL | Status: DC
  Administered 2021-01-31 – 2021-02-02 (×8): 500 mg via ORAL
  Filled 2021-01-31 (×9): qty 1

## 2021-01-31 MED ORDER — ALBUMIN HUMAN 25 % IV SOLN
12.5000 g | Freq: Four times a day (QID) | INTRAVENOUS | Status: DC | PRN
  Filled 2021-01-31: qty 50

## 2021-01-31 MED ORDER — SODIUM CHLORIDE 0.9 % IV BOLUS: 250.0000 mL | Freq: Four times a day (QID) | INTRAVENOUS | Status: DC | PRN

## 2021-01-31 MED ORDER — SODIUM CHLORIDE 0.9% FLUSH
3.0000 mL | Freq: Two times a day (BID) | INTRAVENOUS | Status: DC
  Administered 2021-01-31 – 2021-02-02 (×5): 3 mL via INTRAVENOUS

## 2021-01-31 MED ORDER — FUROSEMIDE 20 MG PO TABS
20.0000 mg | ORAL_TABLET | Freq: Every day | ORAL | Status: DC
  Administered 2021-01-31 – 2021-02-02 (×3): 20 mg via ORAL
  Filled 2021-01-31 (×3): qty 1

## 2021-01-31 MED ORDER — CALCIUM CARBONATE-VITAMIN D 500-200 MG-UNIT PO TABS
1.0000 | ORAL_TABLET | Freq: Every day | ORAL | Status: DC
  Administered 2021-01-31 – 2021-02-02 (×3): 1 via ORAL
  Filled 2021-01-31 (×3): qty 1

## 2021-01-31 MED ORDER — SODIUM CHLORIDE 0.9 % IV SOLN: 250.0000 mL | INTRAVENOUS | Status: DC | PRN

## 2021-01-31 NOTE — Progress Notes (Addendum)
Mobility Specialist - Progress Note    01/31/21 1223  Mobility  Activity Ambulated in hall  Level of Assistance Contact guard assist, steadying assist  Assistive Device None (Hand Held)  Distance Ambulated (ft) 100 ft  Mobility Ambulated with assistance in hallway  Mobility Response Tolerated well  Mobility performed by Mobility specialist  $Mobility charge 1 Mobility   Upon entry pt was eager to ambulate and insisted on not using RW while walking. Pt stated feeling more comfortable using a cane or hand held assist. Pt ambulated 100 ft in hallway while holding mobility specialist's hand and having contact guard assist using gait belt. Pt did report feeling weaker than usual used hand rails at beginning of ambulation, but later did not rely on hand rails. No reports of pain, dizziness, or SOB made. After session, pt returned to room and was left in recliner awaiting OT session.   [place] Specialist Acute Rehabilitation Services Phone: [phone] 01/31/21, 12:26 PM

## 2021-01-31 NOTE — Progress Notes (Signed)
Rebekah Bush [id number] [birth year]  CARE TEAM:  PCP: Roetta Sessions, NP  Outpatient Care Team: Patient Care Team: Roetta Sessions, NP as PCP - General (Nurse Practitioner) Silvestre Gunner, Anderson Malta, PA-C (Inactive) (Dermatology)  Inpatient Treatment Team: Treatment Team: Attending Provider: Nolon Nations, MD; Consulting Physician: Edison Pace, Md, MD; Technician: Abbe Amsterdam, NT; Utilization Review: Alease Medina, RN; Technician: Leda Quail, NT; Charge Nurse: Donia Ast, RN; Occupational Therapist: Marcellina Millin, OT/L; Technician: [place], [place], [place]   Problem List:   Principal Problem:   Incarcerated femoral hernia Active Problems:   Hypothyroidism   Prolonged QT interval   Positive ANA (antinuclear antibody)   Gout   History of fracture due to fall   SBO (small bowel obstruction) (HCC)   Malnutrition of moderate degree   3 Days Post-Op  01/28/2021  Procedure(s): LAPAROSCOPIC FEMORAL  HERNIA REPAIR WITH MESH SMALL BOWEL RESECTION, LAPAROSCOPIC    Assessment  Recovering  [facility] Stay = 3 days)  INCARCERATED RIGHT FEMORAL HERNIA -POD#2 S/p LAPAROSCOPIC FEMORAL HERNIA REPAIR WITH MESH; LAPAROSCOPIC BILATERAL INGUINAL HERNIA REPAIR WITH MESH; LAPAROSCOPIC RETROPSOAS HERNIA REPAIR WITH MESH; TRANSVERSUS ABDOMINIS PLANE (TAP) BLOCK - BILATERAL 10/12 Dr. Johney Maine -Advance to dysphagia 1/full liquid diet.  Possibly soft diet tonight  -Bowel regimen.  FiberCon  -mobilize,  - replace K and Mag   ID - none currently FEN -advance diet.  Follow-up IV fluids.  Resume home Lasix.  Replace magnesium.  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  Seemed bright and alert today.  Markedly improved compared to when I saw her in the emergency department in the middle of  the night with emergency surgery needed.  Defer to primary care for follow-up.  No evidence of any delirium or psychosis at this time.  -mobilize as tolerated to help recovery - continue therapies - recommending SNF vs HHPT with increased family support. Will see how she progresses  Disposition:  Disposition:  The patient is from: [place] discharge to:  Assisted Living with home health therapies versus possible SNF  Anticipated Date of Discharge is:  October 17,2022    Barriers to discharge:  Pending Clinical improvement (more likely than not)  Patient currently is NOT MEDICALLY STABLE for discharge from the hospital from a surgery standpoint.      20 minutes spent in review, evaluation, examination, counseling, and coordination of care.   I have reviewed this patient's available data, including medical history, events of note, physical examination and test results as part of my evaluation.  A significant portion of that time was spent in counseling.  Care during the described time interval was provided by me.  01/31/2021    Subjective: (Chief complaint)  Feeling much better.  Nursing in room.  Tolerating liquids without nausea.  Objective:  Vital signs:  Vitals:   01/30/21 0623 01/30/21 1318 01/30/21 2032 01/31/21 0554  BP: (!) 154/78 (!) 144/74 (!) 154/66 (!) 154/64  Pulse: 85 96 84 71  Resp: 18 16 17 16   Temp: 98.4 F (36.9 C) 97.9 F (36.6 C) 99.2 F (37.3 C) 98 F (36.7 C)  TempSrc: Oral Oral Oral Oral  SpO2: 93% 94% 95% 94%  Weight:      Height:        Last BM Date: 01/30/21  Intake/Output  Yesterday:  10/14 0701 - 10/15 0700 In: 2502.9 [P.O.:840; I.V.:1135.3; IV Piggyback:527.6] Out: 1700 [Urine:1200; Emesis/NG output:500] This shift:  No intake/output data recorded.  Bowel function:  Flatus: YES  BM:  YES  Drain: (No drain)   Physical Exam:  General: Pt awake/alert in no acute distress.  Laying in bed smiling.   Bright and alert. Eyes: PERRL, normal EOM.  Sclera clear.  No icterus Neuro: CN II-XII intact w/o focal sensory/motor deficits. Lymph: No head/neck/groin lymphadenopathy Psych:  No delerium/psychosis/paranoia.  Oriented x 3 HENT: Normocephalic, Mucus membranes moist.  No thrush Neck: Supple, No tracheal deviation.  No obvious thyromegaly Chest: No pain to chest wall compression.  Good respiratory excursion.  No audible wheezing CV:  Pulses intact.  Regular rhythm.  No major extremity edema MS: Normal AROM mjr joints.  No obvious deformity Abdomen: Soft.  Nondistended.  Nontender.  No evidence of peritonitis.  No incarcerated hernias.  Mild sensitivity and swelling in right groin but markedly improved.  No hematoma/seroma.  No recurrent hernia  Ext:  No deformity.  No mjr edema.  No cyanosis Skin: No petechiae / purpurea.  No major sores.  Warm and dry    Results:   Cultures: Recent Results (from the past 720 hour(s))  Resp Panel by RT-PCR (Flu A&B, Covid) Nasopharyngeal Swab     Status: None   Collection Time: 01/28/21  5:39 PM   Specimen: Nasopharyngeal Swab; Nasopharyngeal(NP) swabs in vial transport medium  Result Value Ref Range Status   SARS Coronavirus 2 by RT PCR NEGATIVE NEGATIVE Final    Comment: (NOTE) SARS-CoV-2 target nucleic acids are NOT DETECTED.  The SARS-CoV-2 RNA is generally detectable in upper respiratory specimens during the acute phase of infection. The lowest concentration of SARS-CoV-2 viral copies this assay can detect is 138 copies/mL. A negative result does not preclude SARS-Cov-2 infection and should not be used as the sole basis for treatment or other patient management decisions. A negative result may occur with  improper specimen collection/handling, submission of specimen other than nasopharyngeal swab, presence of viral mutation(s) within the areas targeted by this assay, and inadequate number of viral copies(<138 copies/mL). A negative result  must be combined with clinical observations, patient history, and epidemiological information. The expected result is Negative.  Fact Sheet for Patients:  EntrepreneurPulse.com.au  Fact Sheet for Healthcare Providers:  IncredibleEmployment.be  This test is no t yet approved or cleared by the [place] FDA and  has been authorized for detection and/or diagnosis of SARS-CoV-2 by FDA under an Emergency Use Authorization (EUA). This EUA will remain  in effect (meaning this test can be used) for the duration of the COVID-19 declaration under Section 564(b)(1) of the Act, 21 U.S.C.section 360bbb-3(b)(1), unless the authorization is terminated  or revoked sooner.       Influenza A by PCR NEGATIVE NEGATIVE Final   Influenza B by PCR NEGATIVE NEGATIVE Final    Comment: (NOTE) The Xpert Xpress SARS-CoV-2/FLU/RSV plus assay is intended as an aid in the diagnosis of influenza from Nasopharyngeal swab specimens and should not be used as a sole basis for treatment. Nasal washings and aspirates are unacceptable for Xpert Xpress SARS-CoV-2/FLU/RSV testing.  Fact Sheet for Patients: EntrepreneurPulse.com.au  Fact Sheet for Healthcare Providers: IncredibleEmployment.be  This test is not yet approved or cleared by the [place] FDA and has been authorized for detection and/or diagnosis of SARS-CoV-2 by FDA under an Emergency Use Authorization (EUA). This EUA will remain in effect (meaning this test  can be used) for the duration of the COVID-19 declaration under Section 564(b)(1) of the Act, 21 U.S.C. section 360bbb-3(b)(1), unless the authorization is terminated or revoked.  Performed at [facility], [place] [address], [place], [place] [address]     Labs: Results for orders placed or performed during the hospital encounter of 01/28/21 (from the past 48 hour(s))  CBC     Status: None    Collection Time: 01/30/21  7:33 AM  Result Value Ref Range   WBC 9.6 4.0 - 10.5 K/uL   RBC 4.03 3.87 - 5.11 MIL/uL   Hemoglobin 12.0 12.0 - 15.0 g/dL   HCT 36.5 36.0 - 46.0 %   MCV 90.6 80.0 - 100.0 fL   MCH 29.8 26.0 - 34.0 pg   MCHC 32.9 30.0 - 36.0 g/dL   RDW 14.0 11.5 - 15.5 %   Platelets 260 150 - 400 K/uL   nRBC 0.0 0.0 - 0.2 %    Comment: Performed at [facility], [place] [address] [place], [place] [address]  Basic metabolic panel     Status: Abnormal   Collection Time: 01/30/21  7:33 AM  Result Value Ref Range   Sodium 135 135 - 145 mmol/L    Comment: DELTA CHECK NOTED   Potassium 2.7 (LL) 3.5 - 5.1 mmol/L    Comment: DELTA CHECK NOTED NO VISIBLE HEMOLYSIS KAUSNITZ,S. RN ON 01/30/2021 @ 0825BY MECIAL J. CRITICAL RESULT CALLED TO, READ BACK BY AND VERIFIED WITH:    Chloride 101 98 - 111 mmol/L   CO2 28 22 - 32 mmol/L   Glucose, Bld 67 (L) 70 - 99 mg/dL    Comment: Glucose reference range applies only to samples taken after fasting for at least 8 hours.   BUN 14 8 - 23 mg/dL   Creatinine, Ser 0.69 0.44 - 1.00 mg/dL   Calcium 8.2 (L) 8.9 - 10.3 mg/dL   GFR, Estimated >60 >60 mL/min    Comment: (NOTE) Calculated using the CKD-EPI Creatinine Equation (2021)    Anion gap 6 5 - 15    Comment: Performed at [facility], [place] [address], [place], [place] [address]  Magnesium     Status: Abnormal   Collection Time: 01/30/21  7:33 AM  Result Value Ref Range   Magnesium 1.6 (L) 1.7 - 2.4 mg/dL    Comment: Performed at [facility], [place] [address], [place], [place] [address]  Basic metabolic panel     Status: Abnormal   Collection Time: 01/31/21  4:28 AM  Result Value Ref Range   Sodium 136 135 - 145 mmol/L   Potassium 4.7 3.5 - 5.1 mmol/L    Comment: DELTA CHECK NOTED   Chloride 104 98 - 111 mmol/L   CO2 24 22 - 32 mmol/L   Glucose, Bld 72 70 - 99 mg/dL    Comment: Glucose reference range applies only to samples  taken after fasting for at least 8 hours.   BUN 11 8 - 23 mg/dL   Creatinine, Ser 0.64 0.44 - 1.00 mg/dL   Calcium 8.2 (L) 8.9 - 10.3 mg/dL   GFR, Estimated >60 >60 mL/min    Comment: (NOTE) Calculated using the CKD-EPI Creatinine Equation (2021)    Anion gap 8 5 - 15    Comment: Performed at [facility], [place] [address], [place], [place] [address]  Magnesium     Status: None   Collection Time: 01/31/21  4:28 AM  Result Value Ref Range   Magnesium 1.7 1.7 - 2.4 mg/dL    Comment: Performed at [facility], [place] [address], [place], [place] [address]    Imaging / Studies: No results found.  Medications / Allergies: per chart  Antibiotics: Anti-infectives (From admission, onward)    Start     Dose/Rate Route Frequency Ordered Stop   01/29/21 1800  metroNIDAZOLE (FLAGYL) IVPB 500 mg        500 mg 100 mL/hr over 60 Minutes Intravenous Every 6 hours 01/29/21 1254 01/30/21 0142   01/29/21 0700  ceFAZolin (ANCEF) IVPB 2g/100 mL premix        2 g 200 mL/hr over 30 Minutes Intravenous Every 8 hours 01/29/21 0608 01/29/21 1616   01/29/21 0608  metroNIDAZOLE (FLAGYL) IVPB 500 mg  Status:  Discontinued        500 mg 100 mL/hr over 60 Minutes Intravenous Every 6 hours 01/29/21 0608 01/29/21 1254   01/29/21 0600  ceFAZolin (ANCEF) IVPB 2g/100 mL premix       See Hyperspace for full Linked Orders Report.   2 g 200 mL/hr over 30 Minutes Intravenous On call to O.R. 01/28/21 1840 01/28/21 2004   01/29/21 0600  metroNIDAZOLE (FLAGYL) IVPB 500 mg       See Hyperspace for full Linked Orders Report.   500 mg 100 mL/hr over 60 Minutes Intravenous On call to O.R. 01/28/21 1840 01/28/21 2016   01/28/21 1909  ceFAZolin (ANCEF) 2-4 GM/100ML-% IVPB       Note to Pharmacy: [place]  : cabinet override      01/28/21 1909 01/28/21 2031   01/28/21 1908  metroNIDAZOLE (FLAGYL) 500 MG/100ML IVPB       Note to Pharmacy: [place]  : cabinet override       01/28/21 1908 01/28/21 2031         Note: Portions of this report may have been transcribed using voice recognition software. Every effort was made to ensure accuracy; however, inadvertent computerized transcription errors may be present.   Any transcriptional errors that result from this process are unintentional.    Adin Hector, MD, FACS, MASCRS Esophageal, Gastrointestinal & Colorectal Surgery Robotic and Minimally Invasive Surgery  [facility], [place]  [place], [place], [place] [address] [phone] Fax [phone] Main  CONTACT INFORMATION:  Weekday (9AM-5PM): Call [facility] main office at [phone]  Weeknight (5PM-9AM) or Weekend/Holiday: Check [email or web address] (password " [email or web address]") for General Surgery [facility] coverage  (Please, do not use SecureChat as it is not reliable communication to operating surgeons for immediate patient care)      01/31/2021  9:01 AM

## 2021-01-31 NOTE — Progress Notes (Signed)
Occupational Therapy Treatment Patient Details Name: Rebekah Bush MRN: [id number] DOB: 12-05-1929 Today's Date: 01/31/2021   History of present illness 87 yo female s/p laparoscopic R femoral hernia repair with mesh, laparoscopic small bowel resction on 10/12. PMH includes BCC, gout, HTN, retinal detachment repair.   OT comments  Patient was educated on using RW v.s. no RW for safety with functional mobility and ADLs. Patient verbailzed and demonstrated understanding with trial toileting tasks with and without RW. Patient endorsed that she felt more confident in tasks with RW. Nurse was educated on encouraging patient to use RW when up.Patient tolerated session well.    Recommendations for follow up therapy are one component of a multi-disciplinary discharge planning process, led by the attending physician.  Recommendations may be updated based on patient status, additional functional criteria and insurance authorization.    Follow Up Recommendations  Home health OT    Equipment Recommendations  Other (comment) (RW)    Recommendations for Other Services      Precautions / Restrictions Precautions Precautions: Fall Restrictions Weight Bearing Restrictions: No       Mobility Bed Mobility               General bed mobility comments: patient was up in recliner at this time    Transfers Overall transfer level: Needs assistance Equipment used: Rolling walker (2 wheeled) Transfers: Sit to/from Stand Sit to Stand: Min guard              Balance Overall balance assessment: Mild deficits observed, not formally tested                                         ADL either performed or assessed with clinical judgement   ADL Overall ADL's : Needs assistance/impaired                       Lower Body Dressing Details (indicate cue type and reason): patient was able to participate in donning/doffing socks sitting in recliner. patient was min guard  for standing balance for clothing management to simulate pants. Toilet Transfer: Designer, television/film set Details (indicate cue type and reason): patient attempted to complete toileting tasks with no RW with min guard and unsteadiness evident. patient trialed same task with RW with patient reporting having more confidence in using RW. patient was educated on benefits of having a support for walking. patient verbalized understanding.         Functional mobility during ADLs: Min guard;Rolling walker General ADL Comments: in the hallway as per patient request to increase functional activity tolerance.     Vision Baseline Vision/History: 1 Wears glasses Ability to See in Adequate Light: 1 Impaired Patient Visual Report: No change from baseline     Perception     Praxis      Cognition Arousal/Alertness: Awake/alert Behavior During Therapy: WFL for tasks assessed/performed Overall Cognitive Status: Within Functional Limits for tasks assessed                                 General Comments: patient was noted to have poor safety awarnress.        Exercises     Shoulder Instructions       General Comments      Pertinent Vitals/ Pain  Pain Assessment: No/denies pain Faces Pain Scale: No hurt  Home Living                                          Prior Functioning/Environment              Frequency  Min 2X/week        Progress Toward Goals  OT Goals(current goals can now be found in the care plan section)  Progress towards OT goals: Progressing toward goals  Acute Rehab OT Goals Patient Stated Goal: home if able  Plan Discharge plan remains appropriate    Co-evaluation                 AM-PAC OT "6 Clicks" Daily Activity     Outcome Measure   Help from another person eating meals?: None Help from another person taking care of personal grooming?: None Help from another person toileting, which includes using  toliet, bedpan, or urinal?: A Little Help from another person bathing (including washing, rinsing, drying)?: A Little Help from another person to put on and taking off regular upper body clothing?: A Little Help from another person to put on and taking off regular lower body clothing?: A Little 6 Click Score: 20    End of Session Equipment Utilized During Treatment: Rolling walker  OT Visit Diagnosis: Pain;Unsteadiness on feet (R26.81);History of falling (Z91.81);Muscle weakness (generalized) (M62.81);Other abnormalities of gait and mobility (R26.89)   Activity Tolerance Patient tolerated treatment well   Patient Left in chair;with call bell/phone within reach   Nurse Communication Mobility status        Time: 2376-2831 OT Time Calculation (min): 17 min  Charges: OT General Charges $OT Visit: 1 Visit OT Treatments $Self Care/Home Management : 8-22 mins  Jackelyn Poling OTR/L, [place] Acute Rehabilitation Department Office# [phone] Pager# [phone]   [place] 01/31/2021, 12:52 PM

## 2021-02-01 ENCOUNTER — Inpatient Hospital Stay (HOSPITAL_COMMUNITY): Payer: Medicare Other

## 2021-02-01 DIAGNOSIS — R051 Acute cough: Secondary | ICD-10-CM | POA: Diagnosis not present

## 2021-02-01 LAB — BRAIN NATRIURETIC PEPTIDE: B Natriuretic Peptide: 142.9 pg/mL — ABNORMAL HIGH (ref 0.0–100.0)

## 2021-02-01 MED ORDER — PANTOPRAZOLE SODIUM 40 MG PO TBEC
40.0000 mg | DELAYED_RELEASE_TABLET | Freq: Every day | ORAL | Status: DC
  Administered 2021-02-01: 40 mg via ORAL
  Filled 2021-02-01 (×2): qty 1

## 2021-02-01 MED ORDER — GUAIFENESIN-DM 100-10 MG/5ML PO SYRP
15.0000 mL | ORAL_SOLUTION | ORAL | Status: DC | PRN
Start: 2021-02-01 — End: 2021-02-02

## 2021-02-01 NOTE — Plan of Care (Signed)
°  Problem: Activity: °Goal: Risk for activity intolerance will decrease °Outcome: Progressing °  °Problem: Elimination: °Goal: Will not experience complications related to urinary retention °Outcome: Progressing °  °Problem: Pain Managment: °Goal: General experience of comfort will improve °Outcome: Progressing °  °Problem: Safety: °Goal: Ability to remain free from injury will improve °Outcome: Progressing °  °

## 2021-02-01 NOTE — Progress Notes (Signed)
JACQUILINE ZURCHER [id number] Feb 20, 1930  CARE TEAM:  PCP: Roetta Sessions, NP  Outpatient Care Team: Patient Care Team: Roetta Sessions, NP as PCP - General (Nurse Practitioner) Janne Napoleon, PA-C (Inactive) (Dermatology)  Inpatient Treatment Team: Treatment Team: Attending Provider: Edison Pace, Md, MD; Consulting Physician: Edison Pace, Md, MD; Mobility Specialist: Viviann Spare Charge Nurse: Aura Dials, RN; Utilization Review: Alease Medina, RN; Case Manager: Lynnell Catalan, RN   Problem List:   Principal Problem:   Incarcerated femoral hernia with SBO s/p lap repair 01/28/2021 Active Problems:   Hypothyroidism   Prolonged QT interval   Positive ANA (antinuclear antibody)   Gout   History of fracture due to fall   SBO (small bowel obstruction) ([place])   Malnutrition of moderate degree   Hypomagnesemia   4 Days Post-Op  01/28/2021  Procedure(s): LAPAROSCOPIC FEMORAL  HERNIA REPAIR WITH MESH SMALL BOWEL RESECTION, LAPAROSCOPIC    Assessment  Recovering  [facility] Stay = 4 days)  INCARCERATED RIGHT FEMORAL HERNIA CAUSING SBO WITH BIH & RETROPSAOS HERNIAS  -POD#4 s/p LAPAROSCOPIC FEMORAL HERNIA REPAIR WITH MESH; LAPAROSCOPIC BILATERAL INGUINAL HERNIA REPAIR WITH MESH; LAPAROSCOPIC RETROPSOAS HERNIA REPAIR WITH MESH; TRANSVERSUS ABDOMINIS PLANE (TAP) BLOCK - BILATERAL 10/12 Dr. Johney Maine -Solid diet  -Bowel regimen.  FiberCon  -mobilize,  - replacing K and Mag - recheck in AM   ID - none currently FEN -solid diet.  off IV fluids.  Resumed home Lasix 10/15.  Replace magnesium.  Follow potassium and other electrolytes VTE - lovenox, scds Foley - d/c 10/14   HTN - restarted home meds with as needed backup Hypothyroidism - home synthroid Gout -colchicine I restarted yesterday but patient denies she takes it.  We will hold off for now Pulmonary.  Patient complaining of a cough.  She thinks it is chronic but worse today.  She is on an ACE  inhibitor but I think she has been on it for many years.  She is not hypoxic.  No history of COPD or need for inhalers as far as I can tell in the records.  I ordered a chest x-ray.  Ask edinternal medicine to evaluate and see if it is just the ACE inhibitor or were missing something.  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  Seemed bright and alert today.  Markedly improved compared to when I saw her in the emergency department in the middle of the night with emergency surgery needed.  Defer to primary care for follow-up.  No evidence of any delirium or psychosis at this time.  -mobilize as tolerated to help recovery - continue therapies - recommending SNF vs HHPT with increased family support. Will see how she progresses  Disposition:  Disposition:  The patient is from: [place] discharge to:  Assisted Living with home health therapies versus possible SNF  Anticipated Date of Discharge is:  October 17,2022    Barriers to discharge:  Pending Clinical improvement (more likely than not)  Patient currently is NOT MEDICALLY STABLE for discharge from the hospital from a surgery standpoint.      20 minutes spent in review, evaluation, examination, counseling, and coordination of care.   I have reviewed this patient's available data, including medical history, events of note, physical examination and test results as part of my evaluation.  A significant portion of that time was spent in counseling.  Care during the described time interval was provided by me.  02/01/2021  Subjective: (Chief complaint)  Feeling much better.  Nursing in room.  Tolerating liquids without nausea.  Objective:  Vital signs:  Vitals:   01/31/21 1635 01/31/21 2113 02/01/21 0556 02/01/21 1009  BP: 136/65 (!) 111/52  138/65  Pulse: (!) 57 64 (!) 59 64  Resp:  16    Temp:  98.4 F (36.9 C)    TempSrc:  Oral    SpO2:  93% 94% 95%  Weight:       Height:        Last BM Date: 01/30/21  Intake/Output   Yesterday:  10/15 0701 - 10/16 0700 In: 720 [P.O.:720] Out: 1775 [Urine:1775] This shift:  No intake/output data recorded.  Bowel function:  Flatus: YES  BM:  YES  Drain: (No drain)   Physical Exam:  General: Pt awake/alert in mild acute distress.  Coughing.  Laying in bed smiling.  Bright and alert. Eyes: PERRL, normal EOM.  Sclera clear.  No icterus Neuro: CN II-XII intact w/o focal sensory/motor deficits. Lymph: No head/neck/groin lymphadenopathy Psych:  No delerium/psychosis/paranoia.  Oriented x 3 HENT: Normocephalic, Mucus membranes moist.  No thrush.  No hoarseness.  No secretions Neck: Supple, No tracheal deviation.  No obvious thyromegaly Chest: No pain to chest wall compression.  Good respiratory excursion.  No audible wheezing.  No conversational dyspnea CV:  Pulses intact.  Regular rhythm.  No major extremity edema MS: Normal AROM mjr joints.  No obvious deformity Abdomen: Soft.  Nondistended.  Nontender.  No evidence of peritonitis.  No incarcerated hernias.  Mild sensitivity and swelling in right groin but markedly improved.  No hematoma/seroma.  No recurrent hernia  Ext:  No deformity.  No mjr edema.  No cyanosis Skin: No petechiae / purpurea.  No major sores.  Warm and dry    Results:   Cultures: Recent Results (from the past 720 hour(s))  Resp Panel by RT-PCR (Flu A&B, Covid) Nasopharyngeal Swab     Status: None   Collection Time: 01/28/21  5:39 PM   Specimen: Nasopharyngeal Swab; Nasopharyngeal(NP) swabs in vial transport medium  Result Value Ref Range Status   SARS Coronavirus 2 by RT PCR NEGATIVE NEGATIVE Final    Comment: (NOTE) SARS-CoV-2 target nucleic acids are NOT DETECTED.  The SARS-CoV-2 RNA is generally detectable in upper respiratory specimens during the acute phase of infection. The lowest concentration of SARS-CoV-2 viral copies this assay can detect is 138 copies/mL. A  negative result does not preclude SARS-Cov-2 infection and should not be used as the sole basis for treatment or other patient management decisions. A negative result may occur with  improper specimen collection/handling, submission of specimen other than nasopharyngeal swab, presence of viral mutation(s) within the areas targeted by this assay, and inadequate number of viral copies(<138 copies/mL). A negative result must be combined with clinical observations, patient history, and epidemiological information. The expected result is Negative.  Fact Sheet for Patients:  EntrepreneurPulse.com.au  Fact Sheet for Healthcare Providers:  IncredibleEmployment.be  This test is no t yet approved or cleared by the [place] FDA and  has been authorized for detection and/or diagnosis of SARS-CoV-2 by FDA under an Emergency Use Authorization (EUA). This EUA will remain  in effect (meaning this test can be used) for the duration of the COVID-19 declaration under Section 564(b)(1) of the Act, 21 U.S.C.section 360bbb-3(b)(1), unless the authorization is terminated  or revoked sooner.       Influenza A by PCR NEGATIVE NEGATIVE Final   Influenza B by  PCR NEGATIVE NEGATIVE Final    Comment: (NOTE) The Xpert Xpress SARS-CoV-2/FLU/RSV plus assay is intended as an aid in the diagnosis of influenza from Nasopharyngeal swab specimens and should not be used as a sole basis for treatment. Nasal washings and aspirates are unacceptable for Xpert Xpress SARS-CoV-2/FLU/RSV testing.  Fact Sheet for Patients: EntrepreneurPulse.com.au  Fact Sheet for Healthcare Providers: IncredibleEmployment.be  This test is not yet approved or cleared by the [place] FDA and has been authorized for detection and/or diagnosis of SARS-CoV-2 by FDA under an Emergency Use Authorization (EUA). This EUA will remain in effect (meaning this test can  be used) for the duration of the COVID-19 declaration under Section 564(b)(1) of the Act, 21 U.S.C. section 360bbb-3(b)(1), unless the authorization is terminated or revoked.  Performed at [facility], [place] [address] [place], [place] [address]     Labs: Results for orders placed or performed during the hospital encounter of 01/28/21 (from the past 48 hour(s))  Basic metabolic panel     Status: Abnormal   Collection Time: 01/31/21  4:28 AM  Result Value Ref Range   Sodium 136 135 - 145 mmol/L   Potassium 4.7 3.5 - 5.1 mmol/L    Comment: DELTA CHECK NOTED   Chloride 104 98 - 111 mmol/L   CO2 24 22 - 32 mmol/L   Glucose, Bld 72 70 - 99 mg/dL    Comment: Glucose reference range applies only to samples taken after fasting for at least 8 hours.   BUN 11 8 - 23 mg/dL   Creatinine, Ser 0.64 0.44 - 1.00 mg/dL   Calcium 8.2 (L) 8.9 - 10.3 mg/dL   GFR, Estimated >60 >60 mL/min    Comment: (NOTE) Calculated using the CKD-EPI Creatinine Equation (2021)    Anion gap 8 5 - 15    Comment: Performed at [facility], [place] [address], [place], [place] [address]  Magnesium     Status: None   Collection Time: 01/31/21  4:28 AM  Result Value Ref Range   Magnesium 1.7 1.7 - 2.4 mg/dL    Comment: Performed at [facility], [place] [address] [place], [place] [address]    Imaging / Studies: No results found.  Medications / Allergies: per chart  Antibiotics: Anti-infectives (From admission, onward)    Start     Dose/Rate Route Frequency Ordered Stop   01/29/21 1800  metroNIDAZOLE (FLAGYL) IVPB 500 mg        500 mg 100 mL/hr over 60 Minutes Intravenous Every 6 hours 01/29/21 1254 01/30/21 0142   01/29/21 0700  ceFAZolin (ANCEF) IVPB 2g/100 mL premix        2 g 200 mL/hr over 30 Minutes Intravenous Every 8 hours 01/29/21 0608 01/29/21 1616   01/29/21 0608  metroNIDAZOLE (FLAGYL) IVPB 500 mg  Status:  Discontinued        500 mg 100  mL/hr over 60 Minutes Intravenous Every 6 hours 01/29/21 0608 01/29/21 1254   01/29/21 0600  ceFAZolin (ANCEF) IVPB 2g/100 mL premix       See Hyperspace for full Linked Orders Report.   2 g 200 mL/hr over 30 Minutes Intravenous On call to O.R. 01/28/21 1840 01/28/21 2004   01/29/21 0600  metroNIDAZOLE (FLAGYL) IVPB 500 mg       See Hyperspace for full Linked Orders Report.   500 mg 100 mL/hr over 60 Minutes Intravenous On call to O.R. 01/28/21 1840 01/28/21 2016   01/28/21 1909  ceFAZolin (ANCEF) 2-4 GM/100ML-% IVPB       Note to Pharmacy: [place]  : cabinet override      01/28/21 1909 01/28/21 2031   01/28/21 1908  metroNIDAZOLE (FLAGYL) 500 MG/100ML IVPB       Note to Pharmacy: [place]  : cabinet override      01/28/21 1908 01/28/21 2031         Note: Portions of this report may have been transcribed using voice recognition software. Every effort was made to ensure accuracy; however, inadvertent computerized transcription errors may be present.   Any transcriptional errors that result from this process are unintentional.    Adin Hector, MD, FACS, MASCRS Esophageal, Gastrointestinal & Colorectal Surgery Robotic and Minimally Invasive Surgery  [facility], [place]  [place]. [address], [place], [place] [address] [phone] Fax [phone] Main  CONTACT INFORMATION:  Weekday (9AM-5PM): Call [facility] main office at [phone]  Weeknight (5PM-9AM) or Weekend/Holiday: Check [email or web address] (password " [email or web address]") for General Surgery [facility] coverage  (Please, do not use SecureChat as it is not reliable communication to operating surgeons for immediate patient care)      02/01/2021  10:27 AM

## 2021-02-01 NOTE — Consult Note (Addendum)
Medical Consultation  Rebekah Bush TIR:[id number] DOB: Jul 28, 1929 DOA: 01/28/2021 PCP: Roetta Sessions, NP   Requesting physician: Dr. Johney Maine Date of consultation: 02/01/21 Reason for consultation: Cough  Impression/Recommendations Cough     - lung exam is actually good and she is using her IS effectively     - she notes that she missed a couple days of lasix over the past week, but has had them the last 2 days; UOP is good and she has no edema on exam     - she is not short of breath at rest; she is not actively coughing now     - she notes that her cough is worse at night; this raises the question of PND vs GERD     - CXR ordered; it is negative     - for now, continue benadryl/robitussin PRN for cough, add protonix     - BNP is very minimally elevated; she is not in exacerbation; can continue her lasix  Incarcerated right femoral hernia     - per primary team  Continue PRN benadryl/robitussin. Continue protonix. Can follow up with PCP outpatient. [facility] will sign off. Call back if needed.   Chief Complaint: abdominal pain  HPI:  Rebekah Bush is a 87 y.o. female with medical history significant of hypothyroidism, HTN, actinic keratosis, mild dementia. Presenting with abdominal pain. She was evaluated in the ED and found to have a non-reducible incarcerated hernia in her right groin. She was admitted to the surgical service. She successfully completed emergent surgery.She has been complaining of cough. It is for this reason [facility] was consulted.    She reports that she's had cough for at least the last 10 days. It is a dry, hacking cough that mostly occurs at night. It is not exacerbated by activity. She denies shortness of breath w/ activity or rest. She has had no fever, N/V. A few days after her cough started, she got a COVID test that was negative. A screening COVID test for admission to this hospital was also negative. During her stay she reports her nightly coughing remains.  She was started on PRN benadryl/robitussin. A CXR was ordered. [facility] was consulted for assistance.  Review of Systems:  Denies CP, dyspnea, palpitations, N/V/D, lightheadedness, dizziness. Remainder of ROS is negative for all not mentioned in HPI.  Past Medical History:  Diagnosis Date   Basal cell carcinoma 08/29/2019   sclerosis on right temple   Closed dislocation of right shoulder 05/07/2019   Closed fracture of superior ramus of pubis, initial encounter ([place]) 05/07/2019   Gout    Hypertension    Hyponatremia    Retinal detachment    OD [facility]   Shoulder dislocation, right, initial encounter 05/08/2019   Squamous cell carcinoma of skin 08/29/2019   in situ on left buccal cheek   Squamous cell carcinoma of skin 08/29/2019   in situ on left upper arm, posterior   Squamous cell carcinoma of skin 08/29/2019   in situ on left forearm, posterior   Past Surgical History:  Procedure Laterality Date   BOWEL RESECTION N/A 01/28/2021   Procedure: [place], LAPAROSCOPIC;  Surgeon: Michael Boston, MD;  Location: [facility] ORS;  Service: General;  Laterality: N/A;   CATARACT EXTRACTION Right    [place] - 1980s    CATARACT EXTRACTION Left    [place] Right 01/28/2021   Procedure: LAPAROSCOPIC FEMORAL  HERNIA REPAIR WITH MESH;  Surgeon: Michael Boston, MD;  Location: [facility];  Service: General;  Laterality: Right;   RETINAL DETACHMENT SURGERY     Social History:  reports that she quit smoking about 30 years ago. Her smoking use included cigarettes. She has never used smokeless tobacco. She reports current alcohol use. She reports that she does not use drugs.  Allergies  Allergen Reactions   Bactrim [Sulfamethoxazole-Trimethoprim] Shortness Of Breath, Nausea And Vomiting and Other (See Comments)    Headaches, also   Sulfa Antibiotics Shortness Of Breath, Nausea And Vomiting and Other (See Comments)    Headaches, also     Family History  Problem Relation Age of Onset   Ovarian cancer Mother    Pulmonary embolism Father    Hypertension Son     Prior to Admission medications   Medication Sig Start Date End Date Taking? Authorizing Provider  acetaminophen (TYLENOL) 500 MG tablet Take 500 mg by mouth every 6 (six) hours as needed (for pain).   Yes [provider]  Calcium Carb-Cholecalciferol (CALTRATE 600+D3 PO) Take 2 tablets by mouth in the morning.   Yes [provider]  furosemide (LASIX) 20 MG tablet Take 20 mg by mouth daily. 03/03/19  Yes [provider]  hydrALAZINE (APRESOLINE) 50 MG tablet Take 50 mg by mouth in the morning and at bedtime. 03/29/19  Yes [provider]  ibuprofen (ADVIL) 200 MG tablet Take 200 mg by mouth every 6 (six) hours as needed (for pain).   Yes [provider]  levothyroxine (SYNTHROID, LEVOTHROID) 75 MCG tablet Take 75 mcg by mouth at bedtime.   Yes [provider]  magnesium oxide (MAG-OX) 400 MG tablet Take 400 mg by mouth daily.   Yes [provider]  metoprolol succinate (TOPROL-XL) 100 MG 24 hr tablet Take 100 mg by mouth daily. Take with or immediately following a meal.   Yes [provider]  quinapril (ACCUPRIL) 20 MG tablet Take 20 mg by mouth every evening.   Yes [provider]  Colchicine 0.6 MG CAPS Take 0.6 mg by mouth daily. Patient not taking: No sig reported 08/21/20   Collier Salina, MD  HYDROcodone-acetaminophen (NORCO/VICODIN) 5-325 MG tablet Take 1 tablet by mouth every 6 (six) hours as needed for moderate pain. Patient not taking: Reported on 01/28/2021 05/08/19   [place] Donnamarie Poag, DO  mupirocin ointment (BACTROBAN) 2 % Apply 1 application topically 2 (two) times daily. Patient not taking: No sig reported 09/10/19   Clark-Burning, Anderson [place], PA-C   Physical Exam: Blood pressure 138/65, pulse 64, temperature 98.4 F (36.9 C), temperature source Oral, resp. rate 16, height  5\' 1"  (1.549 m), weight 52.2 kg, SpO2 95 %. Vitals:   02/01/21 0556 02/01/21 1009  BP:  138/65  Pulse: (!) 59 64  Resp:    Temp:    SpO2: 94% 95%    General: 87 y.o. frail appearing female resting in bed in NAD Cardiovascular: RRR, +S1, S2, no m/g/r, equal pulses throughout Respiratory: CTABL, no w/r/r, normal WOB GI: BS+, NDNT, no masses noted, no organomegaly noted MSK: No e/c/c Neuro: A&O x 3, no focal deficits Psyc: Appropriate interaction and affect, calm/cooperative  Labs on Admission:  Basic Metabolic Panel: Recent Labs  Lab 01/28/21 1403 01/29/21 0621 01/30/21 0733 01/31/21 0428  NA 142 144 135 136  K 3.5 3.7 2.7* 4.7  CL 102 108 101 104  CO2 29 28 28 24   GLUCOSE 104* 94 67* 72  BUN  16 15 14 11   CREATININE 0.81 0.76 0.69 0.64  CALCIUM 9.9 9.0 8.2* 8.2*  MG  --  1.6* 1.6* 1.7   Liver Function Tests: Recent Labs  Lab 01/28/21 1403  AST 20  ALT 14  ALKPHOS 79  BILITOT 0.9  PROT 7.0  ALBUMIN 3.6   Recent Labs  Lab 01/28/21 1403  LIPASE 25   No results for input(s): AMMONIA in the last 168 hours. CBC: Recent Labs  Lab 01/28/21 1403 01/29/21 0621 01/30/21 0733  WBC 15.2* 8.8 9.6  NEUTROABS 12.9*  --   --   HGB 15.6* 12.8 12.0  HCT 47.7* 38.9 36.5  MCV 90.3 91.5 90.6  PLT 327 257 260   Cardiac Enzymes: No results for input(s): CKTOTAL, CKMB, CKMBINDEX, TROPONINI in the last 168 hours. BNP: Invalid input(s): POCBNP CBG: No results for input(s): GLUCAP in the last 168 hours.  Radiological Exams on Admission: No results found.  Time spent: 25 minutes  [facility]  If 7PM-7AM, please contact night-coverage [email or web address] 02/01/2021, 11:03 AM

## 2021-02-02 LAB — CBC
HCT: 38.5 % (ref 36.0–46.0)
Hemoglobin: 12.5 g/dL (ref 12.0–15.0)
MCH: 29.4 pg (ref 26.0–34.0)
MCHC: 32.5 g/dL (ref 30.0–36.0)
MCV: 90.6 fL (ref 80.0–100.0)
Platelets: 379 10*3/uL (ref 150–400)
RBC: 4.25 MIL/uL (ref 3.87–5.11)
RDW: 14.3 % (ref 11.5–15.5)
WBC: 9.8 10*3/uL (ref 4.0–10.5)
nRBC: 0 % (ref 0.0–0.2)

## 2021-02-02 LAB — BASIC METABOLIC PANEL
Anion gap: 7 (ref 5–15)
BUN: 19 mg/dL (ref 8–23)
CO2: 27 mmol/L (ref 22–32)
Calcium: 8.3 mg/dL — ABNORMAL LOW (ref 8.9–10.3)
Chloride: 103 mmol/L (ref 98–111)
Creatinine, Ser: 0.83 mg/dL (ref 0.44–1.00)
GFR, Estimated: >60 mL/min (ref >60)
Glucose, Bld: 91 mg/dL (ref 70–99)
Potassium: 3.6 mmol/L (ref 3.5–5.1)
Sodium: 137 mmol/L (ref 135–145)

## 2021-02-02 LAB — PHOSPHORUS: Phosphorus: 3 mg/dL (ref 2.5–4.6)

## 2021-02-02 LAB — MAGNESIUM: Magnesium: 1.6 mg/dL — ABNORMAL LOW (ref 1.7–2.4)

## 2021-02-02 MED ORDER — OXYCODONE HCL 5 MG PO TABS: 5.0000 mg | ORAL_TABLET | Freq: Three times a day (TID) | ORAL | 0 refills | Status: DC | PRN

## 2021-02-02 NOTE — Discharge Summary (Signed)
Patient ID: Rebekah Bush [id number] 1930-02-15 87 y.o.  Admit date: 01/28/2021 Discharge date: 02/02/2021  Admitting Diagnosis: Small bowel obstruction with transition zone going into right femoral hernia by exam and CT scan. Incarcerated  Discharge Diagnosis Incarcerated right femoral hernia with SBO Bilateral inguinal hernia Right retropsoas hernia  Consultants [facility]  H&P:  Elderly woman who had decreased appetite and abdominal pain yesterday.  Worsened.  Felt pain in her right lower side.  Felt worse.  Called emergency services.  Came around 1 PM.  Distended.  Work-up concerning for incarcerated hernia and right groin.  Not reducible.  CT scan noted small bowel trapped within it.  Surgical consultation requested.  Paged at 5:51pm.  She is widowed.  Daughters live in the region are involved.  She lives in assisted living.  She claims she can walk about my without difficulty.  No history of cardiac or pulmonary issues.  No prior stroke.  No blood thinners.  No diabetes.  She does not smoke.  Usually moves her bowels most days.  She recalls being diagnosed with a hernia for the past few years.  Usually has not bothered her.  She recalls having a cholecystectomy done about 7 years ago.  Procedures Dr. Johney Maine - 01/28/2021 LAPAROSCOPIC FEMORAL HERNIA REPAIR WITH MESH LAPAROSCOPIC BILATERAL INGUINAL HERNIA REPAIR WITH MESH LAPAROSCOPIC RETROPSOAS HERNIA REPAIR WITH MESH TRANSVERSUS ABDOMINIS PLANE (TAP) BLOCK - BILATERAL  Hospital Course:  Patient presented for abdominal pain and was found to have Small bowel obstruction with transition zone going into right femoral hernia by exam and CT scan. She was admitted to gen surgery service and taken to the [facility] by Dr. Johney Maine where she underwent above procedure. Patient tolerated this well and was transferred back to the floor. NGT was discontinued, diet was advanced and tolerated. She worked with therapies who initial recommended SNF vs HH. Patient  progressed to [facility] recommendations with OT and was ambulating 100 ft in hallway while holding mobility specialist's hand and having contact guard assist using gait belt. Discussed with CM. Patient will have PT/OT arranged at [facility] at discharge. [facility] was consulted on 10/16 for cough. Workup was reassuring. They recommended outpatient follow up. On POD 5, the patient was voiding well, tolerating diet, working well with therapies, pain well controlled, vital signs stable, incisions c/d/i and felt stable for discharge back to [facility]. Follow up as arranged below.   Physical Exam: Gen:  Alert, NAD, pleasant HEENT: EOM's intact, pupils equal and round Card:  RRR Pulm:  CTAB, no W/R/R, effort normal Abd: Soft, ND, appropriately tender around incisions without peritonitis, +BS, incisions c/d/I  Ext:  No LE edema  Psych: A&Ox3  Skin: no rashes noted, warm and dry   Allergies as of 02/02/2021       Reactions   Bactrim [sulfamethoxazole-trimethoprim] Shortness Of Breath, Nausea And Vomiting, Other (See Comments)   Headaches, also   Sulfa Antibiotics Shortness Of Breath, Nausea And Vomiting, Other (See Comments)   Headaches, also        Medication List     STOP taking these medications    HYDROcodone-acetaminophen 5-325 MG tablet Commonly known as: NORCO/VICODIN   ibuprofen 200 MG tablet Commonly known as: ADVIL   mupirocin ointment 2 % Commonly known as: BACTROBAN       TAKE these medications    acetaminophen 500 MG tablet Commonly known as: TYLENOL Take 500 mg by mouth every 6 (six) hours as needed (for pain).   CALTRATE 600+D3  PO Take 2 tablets by mouth in the morning.   Colchicine 0.6 MG Caps Take 0.6 mg by mouth daily.   furosemide 20 MG tablet Commonly known as: LASIX Take 20 mg by mouth daily.   hydrALAZINE 50 MG tablet Commonly known as: APRESOLINE Take 50 mg by mouth in the morning and at bedtime.   levothyroxine 75 MCG tablet Commonly known as:  SYNTHROID Take 75 mcg by mouth at bedtime.   magnesium oxide 400 MG tablet Commonly known as: MAG-OX Take 400 mg by mouth daily.   metoprolol succinate 100 MG 24 hr tablet Commonly known as: TOPROL-XL Take 100 mg by mouth daily. Take with or immediately following a meal.   oxyCODONE 5 MG immediate release tablet Commonly known as: Oxy IR/ROXICODONE Take 1 tablet (5 mg total) by mouth every 8 (eight) hours as needed for breakthrough pain.   quinapril 20 MG tablet Commonly known as: ACCUPRIL Take 20 mg by mouth every evening.               Durable Medical Equipment  (From admission, onward)           Start     Ordered   02/02/21 0957  For home use only DME Walker rolling  Once       Question Answer Comment  Walker: With 5 Inch Wheels   Patient needs a walker to treat with the following condition Status post surgery      02/02/21 0956              Follow-up Information     Shianne Zeiser [place], MD. Go on 02/18/2021.   Specialties: General Surgery, Colon and Rectal Surgery Why: Your appointment is on 02/18/21, arrive at 1:45 pm Please arrive 30 minutes prior to your appointment to check in and fill out paperwork. Bring photo ID and insurance information. Contact information: [address] [place] [address] [phone]         Roetta Sessions, NP. Call.   Specialty: Nurse Practitioner Why: Call for post-hospitalization follow up appointment with your primary care physician Contact information: [address] [place] [address] [phone]                 Signed: Alferd Apa, [facility] 02/02/2021, 10:03 AM Please see Amion for pager number during day hours 7:00am-4:30pm

## 2021-02-02 NOTE — TOC Transition Note (Signed)
Transition of Care [facility]) - CM/SW Discharge Note  Patient Details  Name: Rebekah Bush MRN: [id number] Date of Birth: 1929-11-15  Transition of Care [facility] CM/SW Contact:  Sherie Don, LCSW Phone Number: 02/02/2021, 10:47 AM  Clinical Narrative: Patient will discharge back to [facility] independent living facility today. Patient will not require an FL2 as she resides in ILF. HH orders faxed to [facility] with [employer] [phone]). CSW spoke with patient and she confirmed her son-in-law will be transporting her back home. CSW asked about a rolling walker as orders were placed. Patient reported her daughter wants to go with her to pick out a walker, so she declined having CSW set it up in the hospital. [place] signing off.  Final next level of care: [place] Barriers to Discharge: Barriers Resolved  Patient Goals and CMS Choice Patient states their goals for this hospitalization and ongoing recovery are:: Return to [facility] Medicare.gov Compare Post Acute Care list provided to:: Patient Choice offered to / list presented to : Patient  Discharge Plan and Services In-house Referral: Clinical Social Work Post Acute Care Choice: Home Health          DME Arranged: N/A DME Agency: NA HH Arranged: PT, OT [place] Agency: Other - See comment Secondary school teacher provides [facility] [place]) Date [place]: 01/30/21 Representative spoke with at [place]: Rollene Fare  Readmission Risk Interventions No flowsheet data found.

## 2021-02-02 NOTE — Progress Notes (Signed)
Rebekah Bush to be D/C'd  assisted living  per MD order.  Discussed with the patient and all questions fully answered.  IV catheter discontinued intact. Site without signs and symptoms of complications. Dressing and pressure applied.  An After Visit Summary was printed and given to the patient. Patient prescriptions sent to pharmacy.  D/c education completed with patient/family including follow up instructions, medication list, d/c activities limitations if indicated, with other d/c instructions as indicated by MD - patient able to verbalize understanding, all questions fully answered.   Patient instructed to return to ED, call 911, or call MD for any changes in condition.   Patient escorted via [place], and D/C home via private auto.  [name] 02/02/2021 12:44 PM

## 2021-02-04 ENCOUNTER — Encounter (HOSPITAL_COMMUNITY): Payer: Self-pay | Admitting: Surgery

## 2022-06-01 ENCOUNTER — Emergency Department (HOSPITAL_COMMUNITY): Payer: Medicare Other

## 2022-06-01 ENCOUNTER — Inpatient Hospital Stay (HOSPITAL_COMMUNITY)
Admission: EM | Admit: 2022-06-01 | Discharge: 2022-06-04 | DRG: 206 | Disposition: A | Discharge status: Home / Self Care | Payer: Medicare Other | Attending: Internal Medicine | Admitting: Internal Medicine

## 2022-06-01 ENCOUNTER — Encounter (HOSPITAL_COMMUNITY): Payer: Self-pay

## 2022-06-01 DIAGNOSIS — Z9841 Cataract extraction status, right eye: Secondary | ICD-10-CM

## 2022-06-01 DIAGNOSIS — Z882 Allergy status to sulfonamides status: Secondary | ICD-10-CM

## 2022-06-01 DIAGNOSIS — R911 Solitary pulmonary nodule: Secondary | ICD-10-CM | POA: Diagnosis not present

## 2022-06-01 DIAGNOSIS — I10 Essential (primary) hypertension: Secondary | ICD-10-CM | POA: Diagnosis present

## 2022-06-01 DIAGNOSIS — Z8041 Family history of malignant neoplasm of ovary: Secondary | ICD-10-CM

## 2022-06-01 DIAGNOSIS — T502X5A Adverse effect of carbonic-anhydrase inhibitors, benzothiadiazides and other diuretics, initial encounter: Secondary | ICD-10-CM | POA: Diagnosis not present

## 2022-06-01 DIAGNOSIS — R42 Dizziness and giddiness: Secondary | ICD-10-CM

## 2022-06-01 DIAGNOSIS — I5032 Chronic diastolic (congestive) heart failure: Secondary | ICD-10-CM | POA: Diagnosis present

## 2022-06-01 DIAGNOSIS — Z8249 Family history of ischemic heart disease and other diseases of the circulatory system: Secondary | ICD-10-CM

## 2022-06-01 DIAGNOSIS — E860 Dehydration: Secondary | ICD-10-CM | POA: Diagnosis present

## 2022-06-01 DIAGNOSIS — Z87891 Personal history of nicotine dependence: Secondary | ICD-10-CM

## 2022-06-01 DIAGNOSIS — R55 Syncope and collapse: Principal | ICD-10-CM

## 2022-06-01 DIAGNOSIS — Z85828 Personal history of other malignant neoplasm of skin: Secondary | ICD-10-CM

## 2022-06-01 DIAGNOSIS — R0609 Other forms of dyspnea: Secondary | ICD-10-CM | POA: Diagnosis present

## 2022-06-01 DIAGNOSIS — E039 Hypothyroidism, unspecified: Secondary | ICD-10-CM | POA: Diagnosis present

## 2022-06-01 DIAGNOSIS — R0600 Dyspnea, unspecified: Secondary | ICD-10-CM

## 2022-06-01 DIAGNOSIS — Z9842 Cataract extraction status, left eye: Secondary | ICD-10-CM

## 2022-06-01 DIAGNOSIS — E876 Hypokalemia: Secondary | ICD-10-CM | POA: Diagnosis not present

## 2022-06-01 DIAGNOSIS — Z888 Allergy status to other drugs, medicaments and biological substances status: Secondary | ICD-10-CM

## 2022-06-01 DIAGNOSIS — R9431 Abnormal electrocardiogram [ECG] [EKG]: Secondary | ICD-10-CM | POA: Diagnosis present

## 2022-06-01 DIAGNOSIS — Z79899 Other long term (current) drug therapy: Secondary | ICD-10-CM

## 2022-06-01 DIAGNOSIS — Z79890 Hormone replacement therapy (postmenopausal): Secondary | ICD-10-CM

## 2022-06-01 DIAGNOSIS — I110 Hypertensive heart disease with heart failure: Secondary | ICD-10-CM | POA: Diagnosis present

## 2022-06-01 DIAGNOSIS — Z1152 Encounter for screening for COVID-19: Secondary | ICD-10-CM

## 2022-06-01 DIAGNOSIS — J189 Pneumonia, unspecified organism: Secondary | ICD-10-CM | POA: Diagnosis present

## 2022-06-01 DIAGNOSIS — R7989 Other specified abnormal findings of blood chemistry: Secondary | ICD-10-CM | POA: Diagnosis present

## 2022-06-01 LAB — CBC WITH DIFFERENTIAL/PLATELET
Abs Immature Granulocytes: 0.05 10*3/uL (ref 0.00–0.07)
Basophils Absolute: 0.1 10*3/uL (ref 0.0–0.1)
Basophils Relative: 1 %
Eosinophils Absolute: 0.2 10*3/uL (ref 0.0–0.5)
Eosinophils Relative: 2 %
HCT: 49.4 % — ABNORMAL HIGH (ref 36.0–46.0)
Hemoglobin: 16.3 g/dL — ABNORMAL HIGH (ref 12.0–15.0)
Immature Granulocytes: 1 %
Lymphocytes Relative: 15 %
Lymphs Abs: 1.3 10*3/uL (ref 0.7–4.0)
MCH: 29.9 pg (ref 26.0–34.0)
MCHC: 33 g/dL (ref 30.0–36.0)
MCV: 90.5 fL (ref 80.0–100.0)
Monocytes Absolute: 1 10*3/uL (ref 0.1–1.0)
Monocytes Relative: 11 %
Neutro Abs: 6.2 10*3/uL (ref 1.7–7.7)
Neutrophils Relative %: 70 %
Platelets: 289 10*3/uL (ref 150–400)
RBC: 5.46 MIL/uL — ABNORMAL HIGH (ref 3.87–5.11)
RDW: 14.5 % (ref 11.5–15.5)
WBC: 8.8 10*3/uL (ref 4.0–10.5)
nRBC: 0 % (ref 0.0–0.2)

## 2022-06-01 LAB — COMPREHENSIVE METABOLIC PANEL
ALT: 18 U/L (ref 0–44)
AST: 29 U/L (ref 15–41)
Albumin: 4.1 g/dL (ref 3.5–5.0)
Alkaline Phosphatase: 86 U/L (ref 38–126)
Anion gap: 13 (ref 5–15)
BUN: 31 mg/dL — ABNORMAL HIGH (ref 8–23)
CO2: 25 mmol/L (ref 22–32)
Calcium: 9.8 mg/dL (ref 8.9–10.3)
Chloride: 101 mmol/L (ref 98–111)
Creatinine, Ser: 0.99 mg/dL (ref 0.44–1.00)
GFR, Estimated: 54 mL/min — ABNORMAL LOW (ref >60)
Glucose, Bld: 105 mg/dL — ABNORMAL HIGH (ref 70–99)
Potassium: 3.7 mmol/L (ref 3.5–5.1)
Sodium: 139 mmol/L (ref 135–145)
Total Bilirubin: 0.8 mg/dL (ref 0.3–1.2)
Total Protein: 7.6 g/dL (ref 6.5–8.1)

## 2022-06-01 LAB — BRAIN NATRIURETIC PEPTIDE: B Natriuretic Peptide: 69.6 pg/mL (ref 0.0–100.0)

## 2022-06-01 LAB — TROPONIN I (HIGH SENSITIVITY): Troponin I (High Sensitivity): 27 ng/L — ABNORMAL HIGH (ref <18)

## 2022-06-01 MED ORDER — DOXYCYCLINE HYCLATE 100 MG PO TABS
100.0000 mg | ORAL_TABLET | Freq: Once | ORAL | Status: AC
  Administered 2022-06-01: 100 mg via ORAL
  Filled 2022-06-01: qty 1

## 2022-06-01 MED ORDER — SODIUM CHLORIDE 0.9 % IV SOLN
1.0000 g | Freq: Once | INTRAVENOUS | Status: AC
  Administered 2022-06-01: 1 g via INTRAVENOUS
  Filled 2022-06-01: qty 10

## 2022-06-01 MED ORDER — IOHEXOL 350 MG/ML SOLN
75.0000 mL | Freq: Once | INTRAVENOUS | Status: AC | PRN
  Administered 2022-06-01: 75 mL via INTRAVENOUS

## 2022-06-01 MED ORDER — METOPROLOL TARTRATE 5 MG/5ML IV SOLN
5.0000 mg | Freq: Once | INTRAVENOUS | Status: AC
  Administered 2022-06-01: 5 mg via INTRAVENOUS
  Filled 2022-06-01: qty 5

## 2022-06-01 MED ORDER — IPRATROPIUM-ALBUTEROL 0.5-2.5 (3) MG/3ML IN SOLN
3.0000 mL | Freq: Once | RESPIRATORY_TRACT | Status: AC
  Administered 2022-06-01: 3 mL via RESPIRATORY_TRACT
  Filled 2022-06-01: qty 3

## 2022-06-01 MED ORDER — HYDRALAZINE HCL 25 MG PO TABS
50.0000 mg | ORAL_TABLET | Freq: Once | ORAL | Status: AC
  Administered 2022-06-01: 50 mg via ORAL
  Filled 2022-06-01: qty 2

## 2022-06-01 NOTE — ED Triage Notes (Signed)
Pt bib ems from [facility]; called out for breathing problems originally; hypertensive today, pt endorses compliance with meds; BP 200/110, hr 100, occ PVCs, sats 98% RA, CBG 130; upon standing pt becomes dizzy, room spinning, and becomes sob; endroses dyspnea with exertion; resolves at rest, pt has no complaints st rest; endorses increased swelling to legs over past several months; lung sounds clear with ems; stroke screen negative

## 2022-06-01 NOTE — ED Provider Triage Note (Signed)
Emergency Medicine Provider Triage Evaluation Note  Rebekah Bush , a 89 y.o. female  was evaluated in triage.  Pt complains of shortness of breath. She reports she was walking towards in her home earlier today when she experienced some increased SOB and dizziness. She was able to sit down and allow symptoms to improve after 5 minutes and did not fall or hit her head. Patient denies significant cardiac history but daughter in room reported that she has a baseline elevation in BNP but no diagnosed CHF.  Review of Systems  Positive: As above Negative: As above  Physical Exam  BP (!) 175/90   Pulse 93   Temp (!) 97.5 F (36.4 C)   Resp 11   Ht 5' (1.524 m)   Wt 52.2 kg   SpO2 97%   BMI 22.46 kg/m  Gen:   Awake, no distress  Resp:  Normal effort  MSK:   Moves extremities without difficulty  Other:    Medical Decision Making  Medically screening exam initiated at 5:05 PM.  Appropriate orders placed.  MILENA OBERHELMAN was informed that the remainder of the evaluation will be completed by another provider, this initial triage assessment does not replace that evaluation, and the importance of remaining in the ED until their evaluation is complete.     Luvenia Heller, PA-C 06/01/22 1707

## 2022-06-01 NOTE — ED Provider Notes (Signed)
[facility] Provider Note   CSN: [id number] Arrival date & time: 06/01/22  1627     History  No chief complaint on file.   Rebekah Bush is a 89 y.o. female.  HPI Patient reports that she got short of breath and lightheaded walking to the laundry room today.  She reports typically this does not make her feel physically exerted.  When she got there she had to sit down and try to catch her breath.  She ended up calling the staff to help her back to her room.  She denies she had chest pain at the time but felt very weak and short of breath.  No history of DVT or PE.  Patient reports she has been getting swelling in her legs that she has noticed for about the past 6 months.  She denies any prior history of known CHF or swelling in likely extremities.  No recent fever chills or productive cough.    Home Medications Prior to Admission medications   Medication Sig Start Date End Date Taking? Authorizing Provider  acetaminophen (TYLENOL) 500 MG tablet Take 500 mg by mouth every 6 (six) hours as needed (for pain).    [provider]  Calcium Carb-Cholecalciferol (CALTRATE 600+D3 PO) Take 2 tablets by mouth in the morning.    [provider]  Colchicine 0.6 MG CAPS Take 0.6 mg by mouth daily. Patient not taking: No sig reported 08/21/20   Collier Salina, MD  furosemide (LASIX) 20 MG tablet Take 20 mg by mouth daily. 03/03/19   [provider]  hydrALAZINE (APRESOLINE) 50 MG tablet Take 50 mg by mouth in the morning and at bedtime. 03/29/19   [provider]  levothyroxine (SYNTHROID, LEVOTHROID) 75 MCG tablet Take 75 mcg by mouth at bedtime.    [provider]  magnesium oxide (MAG-OX) 400 MG tablet Take 400 mg by mouth daily.    [provider]  metoprolol succinate (TOPROL-XL) 100 MG 24 hr tablet Take 100 mg by mouth daily. Take with or immediately following a meal.    [provider]   oxyCODONE (OXY IR/ROXICODONE) 5 MG immediate release tablet Take 1 tablet (5 mg total) by mouth every 8 (eight) hours as needed for breakthrough pain. 02/02/21   Maczis, Barth Kirks, PA-C  quinapril (ACCUPRIL) 20 MG tablet Take 20 mg by mouth every evening.    [provider]      Allergies    Bactrim [sulfamethoxazole-trimethoprim] and Sulfa antibiotics    Review of Systems   Review of Systems  Physical Exam Updated Vital Signs BP (!) 175/97   Pulse 85   Temp (!) 97.5 F (36.4 C) (Oral)   Resp (!) 32   Ht 5' (1.524 m)   Wt 52.2 kg   SpO2 100%   BMI 22.46 kg/m  Physical Exam Constitutional:      Comments: Well-nourished well-developed.  Alert nontoxic clinically well.  No respiratory distress at rest.  HENT:     Mouth/Throat:     Pharynx: Oropharynx is clear.  Eyes:     Extraocular Movements: Extraocular movements intact.  Cardiovascular:     Rate and Rhythm: Normal rate and regular rhythm.  Pulmonary:     Effort: Pulmonary effort is normal.     Breath sounds: Normal breath sounds.  Abdominal:     General: There is no distension.     Palpations: Abdomen is soft.     Tenderness: There  is no abdominal tenderness. There is no guarding.  Musculoskeletal:     Comments: 1+ pitting edema bilateral lower extremities.  Calves soft and nontender.  Skin:    General: Skin is warm and dry.  Neurological:     General: No focal deficit present.     Mental Status: She is oriented to person, place, and time.     Cranial Nerves: No cranial nerve deficit.     Motor: No weakness.     Coordination: Coordination normal.  Psychiatric:        Mood and Affect: Mood normal.     ED Results / Procedures / Treatments   Labs (all labs ordered are listed, but only abnormal results are displayed) Labs Reviewed  COMPREHENSIVE METABOLIC PANEL - Abnormal; Notable for the following components:      Result Value   Glucose, Bld 105 (*)    BUN 31 (*)    GFR, Estimated 54 (*)    All  other components within normal limits  CBC WITH DIFFERENTIAL/PLATELET - Abnormal; Notable for the following components:   RBC 5.46 (*)    Hemoglobin 16.3 (*)    HCT 49.4 (*)    All other components within normal limits  TROPONIN I (HIGH SENSITIVITY) - Abnormal; Notable for the following components:   Troponin I (High Sensitivity) 27 (*)    All other components within normal limits  RESP PANEL BY RT-PCR (RSV, FLU A&B, COVID)  [id number]  BRAIN NATRIURETIC PEPTIDE  URINALYSIS, ROUTINE W REFLEX MICROSCOPIC    EKG EKG Interpretation  Date/Time:  Tuesday June 01 2022 16:39:43 EST Ventricular Rate:  92 PR Interval:  158 QRS Duration: 101 QT Interval:  435 QTC Calculation: 539 R Axis:   81 Text Interpretation: Age not entered, assumed to be  89 years old for purpose of ECG interpretation Sinus rhythm Nonspecific repol abnormality, diffuse leads Prolonged QT interval no sig change from previous Confirmed by Charlesetta Shanks [phone]) on 06/01/2022 9:40:07 PM  Radiology VAS [place] LOWER EXTREMITY VENOUS (DVT)  Result Date: 06/04/2022  Lower Venous DVT Study Patient Name:  Rebekah Bush  Date of Exam:   06/03/2022 Medical Rec #: [id number]       Accession #:    [id number] Date of Birth: [birth year]        Patient Gender: F Patient Age:   77 years Exam Location:  [facility] Procedure:      VAS [place] LOWER EXTREMITY VENOUS (DVT) Referring Phys: JEFFREY MCCLUNG --------------------------------------------------------------------------------  Indications: Swelling.  Risk Factors: History of CHF. Comparison Study: 06-01-2022 CTA chest was negative for PE Performing Technologist: Darlin Coco RDMS, RVT  Examination Guidelines: A complete evaluation includes B-mode imaging, spectral Doppler, color Doppler, and power Doppler as needed of all accessible portions of each vessel. Bilateral testing is considered an integral part of a complete examination. Limited examinations for reoccurring indications may be  performed as noted. The reflux portion of the exam is performed with the patient in reverse Trendelenburg.  +---------+---------------+---------+-----------+----------+--------------+ RIGHT    CompressibilityPhasicitySpontaneityPropertiesThrombus Aging +---------+---------------+---------+-----------+----------+--------------+ CFV      Full           Yes      Yes                                 +---------+---------------+---------+-----------+----------+--------------+ SFJ      Full                                                        +---------+---------------+---------+-----------+----------+--------------+  FV Prox  Full                                                        +---------+---------------+---------+-----------+----------+--------------+ FV Mid   Full                                                        +---------+---------------+---------+-----------+----------+--------------+ FV DistalFull                                                        +---------+---------------+---------+-----------+----------+--------------+ PFV      Full                                                        +---------+---------------+---------+-----------+----------+--------------+ POP      Full           Yes      Yes                                 +---------+---------------+---------+-----------+----------+--------------+ PTV      Full                                                        +---------+---------------+---------+-----------+----------+--------------+ PERO     Full                                                        +---------+---------------+---------+-----------+----------+--------------+ Gastroc  Full                                                        +---------+---------------+---------+-----------+----------+--------------+   +---------+---------------+---------+-----------+----------+--------------+ LEFT      CompressibilityPhasicitySpontaneityPropertiesThrombus Aging +---------+---------------+---------+-----------+----------+--------------+ CFV      Full           Yes      Yes                                 +---------+---------------+---------+-----------+----------+--------------+ SFJ      Full                                                        +---------+---------------+---------+-----------+----------+--------------+  FV Prox  Full                                                        +---------+---------------+---------+-----------+----------+--------------+ FV Mid   Full                                                        +---------+---------------+---------+-----------+----------+--------------+ FV DistalFull                                                        +---------+---------------+---------+-----------+----------+--------------+ PFV      Full                                                        +---------+---------------+---------+-----------+----------+--------------+ POP      Full           Yes      Yes                                 +---------+---------------+---------+-----------+----------+--------------+ PTV      Full                                                        +---------+---------------+---------+-----------+----------+--------------+ PERO     Full                                                        +---------+---------------+---------+-----------+----------+--------------+ Gastroc  Full                                                        +---------+---------------+---------+-----------+----------+--------------+     Summary: RIGHT: - There is no evidence of deep vein thrombosis in the lower extremity.  - No cystic structure found in the popliteal fossa.  LEFT: - There is no evidence of deep vein thrombosis in the lower extremity.  - No cystic structure found in the popliteal fossa.  *See  table(s) above for measurements and observations. Electronically signed by Deitra Mayo MD on 06/04/2022 at 10:34:02 AM.    Final     Procedures Procedures    Medications Ordered in ED Medications  ipratropium-albuterol (DUONEB) 0.5-2.5 (3) MG/3ML nebulizer solution 3 mL (3 mLs Nebulization Given 06/01/22 2153)  metoprolol tartrate (LOPRESSOR) injection 5 mg (5 mg  Intravenous Given 06/01/22 2210)  hydrALAZINE (APRESOLINE) tablet 50 mg (50 mg Oral Given 06/01/22 2210)  iohexol (OMNIPAQUE) 350 MG/ML injection 75 mL (75 mLs Intravenous Contrast Given 06/01/22 2304)    ED Course/ Medical Decision Making/ A&P                             Medical Decision Making Amount and/or Complexity of Data Reviewed Radiology: ordered.  Risk Prescription drug management. Decision regarding hospitalization.   Patient episode of shortness of breath and lightheadedness with near syncope this afternoon.  This time symptoms have resolved at rest..  Patient has been experiencing some swelling of the lower extremities over the past 6 months.  Differential diagnosis includes ACS\PE\CHF\dysrhythmia.  Will proceed with broad diagnostic evaluation.  Patient is hypertensive.  She reports she is compliant with medications.  She reports is atypical for her to have significant hypertension and usually her heart rate is controlled.  She reports she did have an episode of about 6 months ago of some sporadic elevated blood pressures that were managed with medication.  At this time she is still due for a dose of Toprol and hydralazine.  Will give these medications IV.  First troponin very mildly elevated at 22.  CT chest interpreted by radiology positive for filtrate in the right lobe suggestive of infectious etiology.  Negative for PE.  At this time with the patient having advanced age, episode of atypical dyspnea and near syncope will plan for admission to continue rule out for ACS and initiate treatment for community  acquired pneumonia.  Consult: [facility] hospitalist dr. Glo Herring for admission        Final Clinical Impression(s) / ED Diagnoses Final diagnoses:  Near syncope  Dyspnea, unspecified type    Rx / DC Orders ED Discharge Orders     None         Charlesetta Shanks, MD 06/05/22 [phone]

## 2022-06-02 ENCOUNTER — Other Ambulatory Visit: Payer: Self-pay

## 2022-06-02 ENCOUNTER — Encounter (HOSPITAL_COMMUNITY): Payer: Self-pay | Admitting: Internal Medicine

## 2022-06-02 ENCOUNTER — Inpatient Hospital Stay (HOSPITAL_COMMUNITY): Payer: Medicare Other

## 2022-06-02 DIAGNOSIS — R0602 Shortness of breath: Secondary | ICD-10-CM

## 2022-06-02 DIAGNOSIS — T502X5A Adverse effect of carbonic-anhydrase inhibitors, benzothiadiazides and other diuretics, initial encounter: Secondary | ICD-10-CM | POA: Diagnosis not present

## 2022-06-02 DIAGNOSIS — R0609 Other forms of dyspnea: Secondary | ICD-10-CM | POA: Diagnosis present

## 2022-06-02 DIAGNOSIS — Z888 Allergy status to other drugs, medicaments and biological substances status: Secondary | ICD-10-CM | POA: Diagnosis not present

## 2022-06-02 DIAGNOSIS — I5032 Chronic diastolic (congestive) heart failure: Secondary | ICD-10-CM | POA: Diagnosis present

## 2022-06-02 DIAGNOSIS — J189 Pneumonia, unspecified organism: Secondary | ICD-10-CM | POA: Diagnosis not present

## 2022-06-02 DIAGNOSIS — E876 Hypokalemia: Secondary | ICD-10-CM | POA: Diagnosis not present

## 2022-06-02 DIAGNOSIS — R9431 Abnormal electrocardiogram [ECG] [EKG]: Secondary | ICD-10-CM

## 2022-06-02 DIAGNOSIS — E860 Dehydration: Secondary | ICD-10-CM

## 2022-06-02 DIAGNOSIS — Z85828 Personal history of other malignant neoplasm of skin: Secondary | ICD-10-CM | POA: Diagnosis not present

## 2022-06-02 DIAGNOSIS — Z1152 Encounter for screening for COVID-19: Secondary | ICD-10-CM | POA: Diagnosis not present

## 2022-06-02 DIAGNOSIS — Z87891 Personal history of nicotine dependence: Secondary | ICD-10-CM | POA: Diagnosis not present

## 2022-06-02 DIAGNOSIS — R7989 Other specified abnormal findings of blood chemistry: Secondary | ICD-10-CM

## 2022-06-02 DIAGNOSIS — Z79899 Other long term (current) drug therapy: Secondary | ICD-10-CM | POA: Diagnosis not present

## 2022-06-02 DIAGNOSIS — M7989 Other specified soft tissue disorders: Secondary | ICD-10-CM | POA: Diagnosis not present

## 2022-06-02 DIAGNOSIS — Z79890 Hormone replacement therapy (postmenopausal): Secondary | ICD-10-CM | POA: Diagnosis not present

## 2022-06-02 DIAGNOSIS — R55 Syncope and collapse: Secondary | ICD-10-CM | POA: Diagnosis not present

## 2022-06-02 DIAGNOSIS — R42 Dizziness and giddiness: Secondary | ICD-10-CM

## 2022-06-02 DIAGNOSIS — Z8041 Family history of malignant neoplasm of ovary: Secondary | ICD-10-CM | POA: Diagnosis not present

## 2022-06-02 DIAGNOSIS — I10 Essential (primary) hypertension: Secondary | ICD-10-CM

## 2022-06-02 DIAGNOSIS — R911 Solitary pulmonary nodule: Secondary | ICD-10-CM | POA: Diagnosis present

## 2022-06-02 DIAGNOSIS — Z8249 Family history of ischemic heart disease and other diseases of the circulatory system: Secondary | ICD-10-CM | POA: Diagnosis not present

## 2022-06-02 DIAGNOSIS — E039 Hypothyroidism, unspecified: Secondary | ICD-10-CM | POA: Diagnosis present

## 2022-06-02 DIAGNOSIS — I110 Hypertensive heart disease with heart failure: Secondary | ICD-10-CM | POA: Diagnosis present

## 2022-06-02 DIAGNOSIS — Z9842 Cataract extraction status, left eye: Secondary | ICD-10-CM | POA: Diagnosis not present

## 2022-06-02 DIAGNOSIS — Z882 Allergy status to sulfonamides status: Secondary | ICD-10-CM | POA: Diagnosis not present

## 2022-06-02 DIAGNOSIS — Z9841 Cataract extraction status, right eye: Secondary | ICD-10-CM | POA: Diagnosis not present

## 2022-06-02 LAB — CBC WITH DIFFERENTIAL/PLATELET
Abs Immature Granulocytes: 0.06 10*3/uL (ref 0.00–0.07)
Basophils Absolute: 0.1 10*3/uL (ref 0.0–0.1)
Basophils Relative: 1 %
Eosinophils Absolute: 0.1 10*3/uL (ref 0.0–0.5)
Eosinophils Relative: 1 %
HCT: 44.9 % (ref 36.0–46.0)
Hemoglobin: 14.5 g/dL (ref 12.0–15.0)
Immature Granulocytes: 1 %
Lymphocytes Relative: 9 %
Lymphs Abs: 1 10*3/uL (ref 0.7–4.0)
MCH: 29.5 pg (ref 26.0–34.0)
MCHC: 32.3 g/dL (ref 30.0–36.0)
MCV: 91.4 fL (ref 80.0–100.0)
Monocytes Absolute: 1.3 10*3/uL — ABNORMAL HIGH (ref 0.1–1.0)
Monocytes Relative: 12 %
Neutro Abs: 8.9 10*3/uL — ABNORMAL HIGH (ref 1.7–7.7)
Neutrophils Relative %: 76 %
Platelets: 259 10*3/uL (ref 150–400)
RBC: 4.91 MIL/uL (ref 3.87–5.11)
RDW: 14.4 % (ref 11.5–15.5)
WBC: 11.5 10*3/uL — ABNORMAL HIGH (ref 4.0–10.5)
nRBC: 0 % (ref 0.0–0.2)

## 2022-06-02 LAB — ECHOCARDIOGRAM COMPLETE
AR max vel: 1.85 cm2
AV Area VTI: 1.85 cm2
AV Area mean vel: 1.85 cm2
AV Mean grad: 4 mmHg
AV Peak grad: 6.9 mmHg
Ao pk vel: 1.31 m/s
Area-P 1/2: 4.06 cm2
Height: 60 in
S' Lateral: 3.3 cm
Weight: 1840 oz

## 2022-06-02 LAB — COMPREHENSIVE METABOLIC PANEL
ALT: 16 U/L (ref 0–44)
AST: 25 U/L (ref 15–41)
Albumin: 3.3 g/dL — ABNORMAL LOW (ref 3.5–5.0)
Alkaline Phosphatase: 69 U/L (ref 38–126)
Anion gap: 10 (ref 5–15)
BUN: 24 mg/dL — ABNORMAL HIGH (ref 8–23)
CO2: 25 mmol/L (ref 22–32)
Calcium: 9.1 mg/dL (ref 8.9–10.3)
Chloride: 102 mmol/L (ref 98–111)
Creatinine, Ser: 0.87 mg/dL (ref 0.44–1.00)
GFR, Estimated: >60 mL/min (ref >60)
Glucose, Bld: 134 mg/dL — ABNORMAL HIGH (ref 70–99)
Potassium: 3 mmol/L — ABNORMAL LOW (ref 3.5–5.1)
Sodium: 137 mmol/L (ref 135–145)
Total Bilirubin: 0.5 mg/dL (ref 0.3–1.2)
Total Protein: 6.1 g/dL — ABNORMAL LOW (ref 6.5–8.1)

## 2022-06-02 LAB — RESP PANEL BY RT-PCR (RSV, FLU A&B, COVID)  RVPGX2
Influenza A by PCR: NEGATIVE
Influenza B by PCR: NEGATIVE
Resp Syncytial Virus by PCR: NEGATIVE
SARS Coronavirus 2 by RT PCR: NEGATIVE

## 2022-06-02 LAB — URINALYSIS, ROUTINE W REFLEX MICROSCOPIC
Bacteria, UA: NONE SEEN
Bilirubin Urine: NEGATIVE
Glucose, UA: NEGATIVE mg/dL
Hgb urine dipstick: NEGATIVE
Ketones, ur: 5 mg/dL — AB
Nitrite: NEGATIVE
Protein, ur: NEGATIVE mg/dL
Specific Gravity, Urine: 1.03 (ref 1.005–1.030)
pH: 6 (ref 5.0–8.0)

## 2022-06-02 LAB — PROCALCITONIN: Procalcitonin: <0.1 ng/mL

## 2022-06-02 LAB — TROPONIN I (HIGH SENSITIVITY)
Troponin I (High Sensitivity): 33 ng/L — ABNORMAL HIGH (ref <18)
Troponin I (High Sensitivity): 22 ng/L — ABNORMAL HIGH (ref <18)

## 2022-06-02 LAB — PHOSPHORUS: Phosphorus: 3.1 mg/dL (ref 2.5–4.6)

## 2022-06-02 LAB — MAGNESIUM
Magnesium: 1.7 mg/dL (ref 1.7–2.4)
Magnesium: 1.7 mg/dL (ref 1.7–2.4)

## 2022-06-02 MED ORDER — MELATONIN 3 MG PO TABS
3.0000 mg | ORAL_TABLET | Freq: Every evening | ORAL | Status: DC | PRN
  Filled 2022-06-02: qty 1

## 2022-06-02 MED ORDER — BENZONATATE 100 MG PO CAPS: 200.0000 mg | ORAL_CAPSULE | Freq: Three times a day (TID) | ORAL | Status: DC | PRN

## 2022-06-02 MED ORDER — SODIUM CHLORIDE 0.9 % IV SOLN
1.0000 g | INTRAVENOUS | Status: DC
  Administered 2022-06-02: 1 g via INTRAVENOUS
  Filled 2022-06-02: qty 10

## 2022-06-02 MED ORDER — LISINOPRIL 20 MG PO TABS: 20.0000 mg | ORAL_TABLET | Freq: Every evening | ORAL | Status: DC

## 2022-06-02 MED ORDER — POTASSIUM CHLORIDE CRYS ER 20 MEQ PO TBCR
40.0000 meq | EXTENDED_RELEASE_TABLET | Freq: Two times a day (BID) | ORAL | Status: AC
  Administered 2022-06-02 – 2022-06-03 (×3): 40 meq via ORAL
  Filled 2022-06-02 (×3): qty 2

## 2022-06-02 MED ORDER — ACETAMINOPHEN 325 MG PO TABS
650.0000 mg | ORAL_TABLET | Freq: Four times a day (QID) | ORAL | Status: DC | PRN
  Administered 2022-06-02 – 2022-06-03 (×3): 650 mg via ORAL
  Filled 2022-06-02 (×3): qty 2

## 2022-06-02 MED ORDER — LEVOTHYROXINE SODIUM 75 MCG PO TABS
75.0000 ug | ORAL_TABLET | Freq: Every day | ORAL | Status: DC
  Administered 2022-06-02 – 2022-06-03 (×2): 75 ug via ORAL
  Filled 2022-06-02 (×2): qty 1

## 2022-06-02 MED ORDER — MAGNESIUM SULFATE 2 GM/50ML IV SOLN
2.0000 g | Freq: Once | INTRAVENOUS | Status: AC
  Administered 2022-06-02: 2 g via INTRAVENOUS
  Filled 2022-06-02: qty 50

## 2022-06-02 MED ORDER — METOPROLOL SUCCINATE ER 100 MG PO TB24
100.0000 mg | ORAL_TABLET | Freq: Every day | ORAL | Status: DC
  Administered 2022-06-02 – 2022-06-04 (×3): 100 mg via ORAL
  Filled 2022-06-02: qty 1
  Filled 2022-06-02: qty 4
  Filled 2022-06-02: qty 1

## 2022-06-02 MED ORDER — LACTATED RINGERS IV SOLN: INTRAVENOUS | Status: DC

## 2022-06-02 MED ORDER — SODIUM CHLORIDE 0.9 % IV SOLN
100.0000 mg | Freq: Two times a day (BID) | INTRAVENOUS | Status: DC
  Administered 2022-06-02 – 2022-06-03 (×3): 100 mg via INTRAVENOUS
  Filled 2022-06-02 (×6): qty 100

## 2022-06-02 MED ORDER — ACETAMINOPHEN 650 MG RE SUPP: 650.0000 mg | Freq: Four times a day (QID) | RECTAL | Status: DC | PRN

## 2022-06-02 MED ORDER — HYDRALAZINE HCL 25 MG PO TABS
50.0000 mg | ORAL_TABLET | Freq: Two times a day (BID) | ORAL | Status: DC
  Administered 2022-06-02: 50 mg via ORAL
  Filled 2022-06-02: qty 2

## 2022-06-02 NOTE — ED Notes (Signed)
ED TO INPATIENT HANDOFF REPORT  ED Nurse Name and Phone #: Anette Guarneri [phone]  S Name/Age/Gender Rebekah Bush 89 y.o. female Room/Bed: 001C/001C  Code Status   Code Status: Full Code  Home/SNF/Other Skilled nursing facility Patient oriented to: self, place, time, and situation Is this baseline? Yes   Triage Complete: Triage complete  Chief Complaint CAP (community acquired pneumonia) [J18.9]  Triage Note Pt bib ems from [place]; called out for breathing problems originally; hypertensive today, pt endorses compliance with meds; BP 200/110, hr 100, occ PVCs, sats 98% RA, CBG 130; upon standing pt becomes dizzy, room spinning, and becomes sob; endroses dyspnea with exertion; resolves at rest, pt has no complaints st rest; endorses increased swelling to legs over past several months; lung sounds clear with ems; stroke screen negative   Allergies Allergies  Allergen Reactions   Bactrim [Sulfamethoxazole-Trimethoprim] Shortness Of Breath, Nausea And Vomiting and Other (See Comments)    Headaches   Sulfa Antibiotics Shortness Of Breath, Nausea And Vomiting and Other (See Comments)    Headaches     Level of Care/Admitting Diagnosis ED Disposition     ED Disposition  Admit   Condition  --   [place]: [place] [[id number]]  Level of Care: Telemetry Medical [104]  May admit patient to [facility] or [facility] if equivalent level of care is available:: No  Covid Evaluation: Asymptomatic - no recent exposure (last 10 days) testing not required  Diagnosis: CAP (community acquired pneumonia) [id number]  Admitting Physician: Rhetta Mura [id number]  Attending Physician: Rhetta Mura [id number]  Certification:: I certify this patient will need inpatient services for at least 2 midnights  Estimated Length of Stay: 2          B Medical/Surgery History Past Medical History:  Diagnosis Date   Basal cell carcinoma 08/29/2019    sclerosis on right temple   Closed dislocation of right shoulder 05/07/2019   Closed fracture of superior ramus of pubis, initial encounter ([place]) 05/07/2019   Gout    Hypertension    Hyponatremia    Retinal detachment    OD [facility]   Shoulder dislocation, right, initial encounter 05/08/2019   Squamous cell carcinoma of skin 08/29/2019   in situ on left buccal cheek   Squamous cell carcinoma of skin 08/29/2019   in situ on left upper arm, posterior   Squamous cell carcinoma of skin 08/29/2019   in situ on left forearm, posterior   Past Surgical History:  Procedure Laterality Date   CATARACT EXTRACTION Right    Gershon Crane - 1980s    CATARACT EXTRACTION Left    Gershon Crane - [place] Right 01/28/2021   Procedure: LAPAROSCOPIC FEMORAL  HERNIA REPAIR WITH MESH,TRANSVERSE ABDOMINUS PLANE (TAP)BLOCK;  Surgeon: Michael Boston, MD;  Location: [facility];  Service: General;  Laterality: Right;   INGUINAL HERNIA REPAIR Bilateral 01/28/2021   Procedure: LAPAROSCOPIC BILATERAL INGUINAL HERNIA REPAIR, LAPAROSCOPIC RETROPSOAS HERNIA REPAIR WITH MESH;  Surgeon: Michael Boston, MD;  Location: [facility];  Service: General;  Laterality: Bilateral;   RETINAL DETACHMENT SURGERY       A IV Location/Drains/Wounds Patient Lines/Drains/Airways Status     Active Line/Drains/Airways     Name Placement date Placement time Site Days   Peripheral IV 06/01/22 20 G Anterior;Proximal;Right Forearm 06/01/22  2210  Forearm  1   External Urinary Catheter 01/31/21  0800  --  487   Incision (  Closed) 01/28/21 Abdomen 01/28/21  2127  -- 490   Incision - 3 Ports Abdomen Right;Mid Left;Mid 01/28/21  2045  -- 490            Intake/Output Last 24 hours  Intake/Output Summary (Last 24 hours) at 06/02/2022 1345 Last data filed at 06/02/2022 1009 Gross per 24 hour  Intake 416.67 ml  Output --  Net 416.67 ml    Labs/Imaging Results for orders placed or performed during the  hospital encounter of 06/01/22 (from the past 48 hour(s))  Comprehensive metabolic panel     Status: Abnormal   Collection Time: 06/01/22  5:04 PM  Result Value Ref Range   Sodium 139 135 - 145 mmol/L   Potassium 3.7 3.5 - 5.1 mmol/L   Chloride 101 98 - 111 mmol/L   CO2 25 22 - 32 mmol/L   Glucose, Bld 105 (H) 70 - 99 mg/dL    Comment: Glucose reference range applies only to samples taken after fasting for at least 8 hours.   BUN 31 (H) 8 - 23 mg/dL   Creatinine, Ser 0.99 0.44 - 1.00 mg/dL   Calcium 9.8 8.9 - 10.3 mg/dL   Total Protein 7.6 6.5 - 8.1 g/dL   Albumin 4.1 3.5 - 5.0 g/dL   AST 29 15 - 41 U/L   ALT 18 0 - 44 U/L   Alkaline Phosphatase 86 38 - 126 U/L   Total Bilirubin 0.8 0.3 - 1.2 mg/dL   GFR, Estimated 54 (L) >60 mL/min    Comment: (NOTE) Calculated using the CKD-EPI Creatinine Equation (2021)    Anion gap 13 5 - 15    Comment: Performed at [facility] [address], [place], [place] [address]  Brain natriuretic peptide     Status: None   Collection Time: 06/01/22  5:04 PM  Result Value Ref Range   B Natriuretic Peptide 69.6 0.0 - 100.0 pg/mL    Comment: Performed at [facility] [address] [place], [place] [address]  CBC with Differential     Status: Abnormal   Collection Time: 06/01/22  5:04 PM  Result Value Ref Range   WBC 8.8 4.0 - 10.5 K/uL   RBC 5.46 (H) 3.87 - 5.11 MIL/uL   Hemoglobin 16.3 (H) 12.0 - 15.0 g/dL   HCT 49.4 (H) 36.0 - 46.0 %   MCV 90.5 80.0 - 100.0 fL   MCH 29.9 26.0 - 34.0 pg   MCHC 33.0 30.0 - 36.0 g/dL   RDW 14.5 11.5 - 15.5 %   Platelets 289 150 - 400 K/uL   nRBC 0.0 0.0 - 0.2 %   Neutrophils Relative % 70 %   Neutro Abs 6.2 1.7 - 7.7 K/uL   Lymphocytes Relative 15 %   Lymphs Abs 1.3 0.7 - 4.0 K/uL   Monocytes Relative 11 %   Monocytes Absolute 1.0 0.1 - 1.0 K/uL   Eosinophils Relative 2 %   Eosinophils Absolute 0.2 0.0 - 0.5 K/uL   Basophils Relative 1 %   Basophils Absolute 0.1 0.0 - 0.1 K/uL   Immature  Granulocytes 1 %   Abs Immature Granulocytes 0.05 0.00 - 0.07 K/uL    Comment: Performed at [facility], [address], [place], [place] [address]  Troponin I (High Sensitivity)     Status: Abnormal   Collection Time: 06/01/22 10:00 PM  Result Value Ref Range   Troponin I (High Sensitivity) 27 (H) <18 ng/L  Comment: (NOTE) Elevated high sensitivity troponin I (hsTnI) values and significant  changes across serial measurements may suggest ACS but many other  chronic and acute conditions are known to elevate hsTnI results.  Refer to the "Links" section for chest pain algorithms and additional  guidance. Performed at [facility], [place] [address], [place], [place] [address]   CBC with Differential/Platelet     Status: Abnormal   Collection Time: 06/02/22 12:18 AM  Result Value Ref Range   WBC 11.5 (H) 4.0 - 10.5 K/uL   RBC 4.91 3.87 - 5.11 MIL/uL   Hemoglobin 14.5 12.0 - 15.0 g/dL   HCT 44.9 36.0 - 46.0 %   MCV 91.4 80.0 - 100.0 fL   MCH 29.5 26.0 - 34.0 pg   MCHC 32.3 30.0 - 36.0 g/dL   RDW 14.4 11.5 - 15.5 %   Platelets 259 150 - 400 K/uL   nRBC 0.0 0.0 - 0.2 %   Neutrophils Relative % 76 %   Neutro Abs 8.9 (H) 1.7 - 7.7 K/uL   Lymphocytes Relative 9 %   Lymphs Abs 1.0 0.7 - 4.0 K/uL   Monocytes Relative 12 %   Monocytes Absolute 1.3 (H) 0.1 - 1.0 K/uL   Eosinophils Relative 1 %   Eosinophils Absolute 0.1 0.0 - 0.5 K/uL   Basophils Relative 1 %   Basophils Absolute 0.1 0.0 - 0.1 K/uL   Immature Granulocytes 1 %   Abs Immature Granulocytes 0.06 0.00 - 0.07 K/uL    Comment: Performed at [facility] [address] [place], [place] [address]  Comprehensive metabolic panel     Status: Abnormal   Collection Time: 06/02/22 12:18 AM  Result Value Ref Range   Sodium 137 135 - 145 mmol/L   Potassium 3.0 (L) 3.5 - 5.1 mmol/L   Chloride 102 98 - 111 mmol/L   CO2 25 22 - 32 mmol/L   Glucose, Bld 134 (H) 70 - 99 mg/dL    Comment: Glucose reference range  applies only to samples taken after fasting for at least 8 hours.   BUN 24 (H) 8 - 23 mg/dL   Creatinine, Ser 0.87 0.44 - 1.00 mg/dL   Calcium 9.1 8.9 - 10.3 mg/dL   Total Protein 6.1 (L) 6.5 - 8.1 g/dL   Albumin 3.3 (L) 3.5 - 5.0 g/dL   AST 25 15 - 41 U/L   ALT 16 0 - 44 U/L   Alkaline Phosphatase 69 38 - 126 U/L   Total Bilirubin 0.5 0.3 - 1.2 mg/dL   GFR, Estimated >60 >60 mL/min    Comment: (NOTE) Calculated using the CKD-EPI Creatinine Equation (2021)    Anion gap 10 5 - 15    Comment: Performed at [facility], [place] [address] [place], [place] [address]  Magnesium     Status: None   Collection Time: 06/02/22 12:18 AM  Result Value Ref Range   Magnesium 1.7 1.7 - 2.4 mg/dL    Comment: Performed at [facility] [address] [place], [place] [address]  Phosphorus     Status: None   Collection Time: 06/02/22 12:18 AM  Result Value Ref Range   Phosphorus 3.1 2.5 - 4.6 mg/dL    Comment: Performed at [facility] [address], [place], [place] [address]  Urinalysis, Routine w reflex microscopic -Urine, Clean Catch     Status: Abnormal   Collection Time: 06/02/22 12:33 AM  Result Value Ref  Range   Color, Urine YELLOW YELLOW   APPearance CLEAR CLEAR   Specific Gravity, Urine 1.030 1.005 - 1.030   pH 6.0 5.0 - 8.0   Glucose, UA NEGATIVE NEGATIVE mg/dL   Hgb urine dipstick NEGATIVE NEGATIVE   Bilirubin Urine NEGATIVE NEGATIVE   Ketones, ur 5 (A) NEGATIVE mg/dL   Protein, ur NEGATIVE NEGATIVE mg/dL   Nitrite NEGATIVE NEGATIVE   Leukocytes,Ua TRACE (A) NEGATIVE   RBC / HPF 0-5 0 - 5 RBC/hpf   WBC, UA 0-5 0 - 5 WBC/hpf   Bacteria, UA NONE SEEN NONE SEEN   Squamous Epithelial / HPF 0-5 0 - 5 /HPF    Comment: Performed at [facility], [place] [address], [place], [place] [address]  Troponin I (High Sensitivity)     Status: Abnormal   Collection Time: 06/02/22  1:40 AM  Result Value Ref Range   Troponin I (High Sensitivity) 33 (H) <18 ng/L    Comment:  (NOTE) Elevated high sensitivity troponin I (hsTnI) values and significant  changes across serial measurements may suggest ACS but many other  chronic and acute conditions are known to elevate hsTnI results.  Refer to the "Links" section for chest pain algorithms and additional  guidance. Performed at [facility], [place] [address], [place], [place] [address]   Magnesium     Status: None   Collection Time: 06/02/22  1:40 AM  Result Value Ref Range   Magnesium 1.7 1.7 - 2.4 mg/dL    Comment: Performed at [facility] [address], [place], [place] [address]  Procalcitonin - Baseline     Status: None   Collection Time: 06/02/22  1:40 AM  Result Value Ref Range   Procalcitonin <0.10 ng/mL    Comment:        Interpretation: PCT (Procalcitonin) <= 0.5 ng/mL: Systemic infection (sepsis) is not likely. Local bacterial infection is possible. (NOTE)       Sepsis PCT Algorithm           Lower Respiratory Tract                                      Infection PCT Algorithm    ----------------------------     ----------------------------         PCT < 0.25 ng/mL                PCT < 0.10 ng/mL          Strongly encourage             Strongly discourage   discontinuation of antibiotics    initiation of antibiotics    ----------------------------     -----------------------------       PCT 0.25 - 0.50 ng/mL            PCT 0.10 - 0.25 ng/mL               OR       >80% decrease in PCT            Discourage initiation of                                            antibiotics      Encourage discontinuation  of antibiotics    ----------------------------     -----------------------------         PCT >= 0.50 ng/mL              PCT 0.26 - 0.50 ng/mL               AND        <80% decrease in PCT             Encourage initiation of                                             antibiotics       Encourage continuation           of antibiotics    ----------------------------      -----------------------------        PCT >= 0.50 ng/mL                  PCT > 0.50 ng/mL               AND         increase in PCT                  Strongly encourage                                      initiation of antibiotics    Strongly encourage escalation           of antibiotics                                     -----------------------------                                           PCT <= 0.25 ng/mL                                                 OR                                        > 80% decrease in PCT                                      Discontinue / Do not initiate                                             antibiotics  Performed at [facility], [address], [place], [place] [address]   Troponin I (High Sensitivity)     Status: Abnormal   Collection Time: 06/02/22 10:19 AM  Result Value Ref Range   Troponin I (High Sensitivity) 22 (  H) <18 ng/L    Comment: (NOTE) Elevated high sensitivity troponin I (hsTnI) values and significant  changes across serial measurements may suggest ACS but many other  chronic and acute conditions are known to elevate hsTnI results.  Refer to the "Links" section for chest pain algorithms and additional  guidance. Performed at [facility], [place] [address] [place], [place] [address]    CT Angio Chest PE W/Cm &/Or Wo Cm  Result Date: 06/01/2022 CLINICAL DATA:  Breathing problems. EXAM: CT ANGIOGRAPHY CHEST WITH CONTRAST TECHNIQUE: Multidetector CT imaging of the chest was performed using the standard protocol during bolus administration of intravenous contrast. Multiplanar CT image reconstructions and MIPs were obtained to evaluate the vascular anatomy. RADIATION DOSE REDUCTION: This exam was performed according to the departmental dose-optimization program which includes automated exposure control, adjustment of the Sameka Bagent and/or kV according to patient size and/or use of iterative reconstruction technique. CONTRAST:  17m  OMNIPAQUE IOHEXOL 350 MG/ML SOLN COMPARISON:  None Available. FINDINGS: Cardiovascular: Satisfactory opacification of the pulmonary arteries to the segmental level. No evidence of pulmonary embolism. Normal heart size. No pericardial effusion. There are atherosclerotic calcifications of the aorta. Mediastinum/Nodes: No enlarged mediastinal, hilar, or axillary lymph nodes. Thyroid gland, trachea, and esophagus demonstrate no significant findings. Lungs/Pleura: Mild emphysematous changes are present. There is scarring in both lung apices. There is a cluster of micro nodules in the posterior right upper lobe with nodules measuring up 3 mm. There is an associated ground-glass opacities in this region. There is some tree-in-bud opacities in the inferior right upper lobe as well. There is no pleural effusion or pneumothorax. There is linear scarring or atelectasis in the lung bases. Upper Abdomen: No acute findings. There is a 3 cm cyst in the left lobe of liver. Cholecystectomy clips are present. Musculoskeletal: Degenerative changes affect the spine. Review of the MIP images confirms the above findings. IMPRESSION: 1. No evidence for pulmonary embolism. 2. Tree-in-bud opacities in the inferior right upper lobe with cluster of micro nodules in the posterior right upper lobe. Findings are favored as infectious/inflammatory. Nodules measure up to 3 mm. Per Fleischner Society Guidelines, a non-contrast Chest CT at 12 months is optional. If performed and the nodule is stable at 12 months, no further follow-up is recommended. These guidelines do not apply to immunocompromised patients and patients with cancer. Follow up in patients with significant comorbidities as clinically warranted. For lung cancer screening, adhere to Lung-RADS guidelines. Reference: Radiology. 2017; 284(1):228-43. Aortic Atherosclerosis (ICD10-I70.0) and Emphysema (ICD10-J43.9). Electronically Signed   By: ARonney AstersM.D.   On: 06/01/2022 23:18    DG Chest Portable 1 View  Result Date: 06/01/2022 CLINICAL DATA:  Shortness of breath. EXAM: PORTABLE CHEST 1 VIEW COMPARISON:  February 01, 2021. FINDINGS: The heart size and mediastinal contours are within normal limits. Both lungs are clear. The visualized skeletal structures are unremarkable. IMPRESSION: No active disease. Electronically Signed   By: JMarijo ConceptionM.D.   On: 06/01/2022 17:19    Pending Labs Unresulted Labs (From admission, onward)     Start     Ordered   06/03/22 0500  Procalcitonin  Daily,   R      06/02/22 0822   06/03/22 [id number] Basic metabolic panel  Tomorrow morning,   R        06/02/22 0822   06/03/22 0500  Magnesium  Tomorrow morning,   R        06/02/22 0822   06/03/22 0500  CBC  Tomorrow morning,   R        06/02/22 0822   06/02/22 0051  Strep pneumoniae urinary antigen  Add-on,   AD        06/02/22 0050   06/01/22 1704  Resp panel by RT-PCR (RSV, Flu A&B, Covid) Anterior Nasal Swab  (Resp panel by RT-PCR (RSV, Flu A&B, Covid))  Once,   URGENT       Question Answer Comment  Patient immune status Normal   Release to patient Immediate      06/01/22 1704            Vitals/Pain Today's Vitals   06/02/22 1130 06/02/22 1200 06/02/22 1321 06/02/22 1330  BP: (!) 142/73 (!) 159/85 (!) 173/106 (!) 172/86  Pulse: 76 63 77 78  Resp: (!) 26 17 14 18  $ Temp:      TempSrc:      SpO2: 97% 100% 97% 97%  Weight:      Height:      PainSc: 0-No pain       Isolation Precautions No active isolations  Medications Medications  cefTRIAXone (ROCEPHIN) 1 g in sodium chloride 0.9 % 100 mL IVPB (has no administration in time range)  doxycycline (VIBRAMYCIN) 100 mg in sodium chloride 0.9 % 250 mL IVPB (0 mg Intravenous Stopped 06/02/22 1234)  acetaminophen (TYLENOL) tablet 650 mg (650 mg Oral Given 06/02/22 1055)  melatonin tablet 3 mg (has no administration in time range)  benzonatate (TESSALON) capsule 200 mg (has no administration in time range)  hydrALAZINE  (APRESOLINE) tablet 50 mg (50 mg Oral Given 06/02/22 0857)  levothyroxine (SYNTHROID) tablet 75 mcg (has no administration in time range)  metoprolol succinate (TOPROL-XL) 24 hr tablet 100 mg (100 mg Oral Given 06/02/22 0856)  potassium chloride SA (KLOR-CON M) CR tablet 40 mEq (40 mEq Oral Given 06/02/22 0856)  lactated ringers infusion ( Intravenous Restarted 06/02/22 1240)  ipratropium-albuterol (DUONEB) 0.5-2.5 (3) MG/3ML nebulizer solution 3 mL (3 mLs Nebulization Given 06/01/22 2153)  metoprolol tartrate (LOPRESSOR) injection 5 mg (5 mg Intravenous Given 06/01/22 2210)  hydrALAZINE (APRESOLINE) tablet 50 mg (50 mg Oral Given 06/01/22 2210)  iohexol (OMNIPAQUE) 350 MG/ML injection 75 mL (75 mLs Intravenous Contrast Given 06/01/22 2304)  cefTRIAXone (ROCEPHIN) 1 g in sodium chloride 0.9 % 100 mL IVPB (0 g Intravenous Stopped 06/02/22 0011)  doxycycline (VIBRA-TABS) tablet 100 mg (100 mg Oral Given 06/01/22 2342)  magnesium sulfate IVPB 2 g 50 mL (0 g Intravenous Stopped 06/02/22 1009)    Mobility walks     Focused Assessments Pulmonary Assessment Handoff:  Lung sounds:   O2 Device: Room Air      R Recommendations: See Admitting Provider Note  Report given to:   Additional Notes:

## 2022-06-02 NOTE — Evaluation (Signed)
Occupational Therapy Evaluation and Discharge Patient Details Name: Rebekah Bush MRN: [id number] DOB: [birth year] Today's Date: 06/02/2022   History of Present Illness Rebekah Bush is a 89 y.o.female admitted to [facility] on 06/01/2022 with community-acquired pneumonia after presenting from facility to [facility] ED complaining of shortness of breath. 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 All education completed, we will D/C from acute OT.      Recommendations for follow up therapy are one component of a multi-disciplinary discharge planning process, led by the attending physician.  Recommendations may be updated based on patient status, additional functional criteria and insurance authorization.   Follow Up Recommendations  No OT follow up     Assistance Recommended at Discharge PRN  Patient can return home with the following Assistance with cooking/housework    Functional Status Assessment  Patient has had a recent decline in their functional status and demonstrates the ability to make significant improvements in function in a reasonable and predictable amount of time. (without further need for skilled OT, all education completed)  Equipment Recommendations  None recommended by OT       Precautions / Restrictions Precautions Precautions: Fall Restrictions Weight Bearing Restrictions: No      Mobility Bed Mobility Overal bed mobility: Independent                  Transfers Overall transfer level: Modified independent                 General transfer comment: pushing IV pole---a bit tentative but the more she moved with it the more normal her flow of ambulating       Balance Overall balance assessment: Mild deficits observed, not formally tested                                         ADL either performed or assessed with clinical judgement   ADL Overall ADL's : Modified independent                                       General ADL Comments: increased time which is to be expected at 89 yo. We discussed energy conservation strategy of tasks taking less energy if you can sit to them v. standing (ie: bathing).     Vision Baseline Vision/History: 1 Wears glasses Patient Visual Report: No change from baseline              Pertinent Vitals/Pain Pain Assessment Pain Assessment: No/denies pain     Hand Dominance Right   Extremity/Trunk Assessment Upper Extremity Assessment Upper Extremity Assessment: Overall WFL for tasks assessed           Communication Communication Communication: No difficulties   Cognition Arousal/Alertness: Awake/alert Behavior During Therapy: WFL for tasks assessed/performed Overall Cognitive Status: Within Functional Limits for tasks assessed  General Comments  Pt did get SOB upon ambulating ~40 feet (educated on purse lipped breathing in sets of 5)--one set settled her breathing down            Home Living Family/patient expects to be discharged to:: Private residence Living Arrangements: Alone   Type of Home: Independent living facility (at [facility] (main building first floor)) Home Access: Level entry     Home Layout: One level     Bathroom Shower/Tub: Occupational psychologist: [place]: Rollator (4 wheels);Cane - single point;Shower seat          Prior Functioning/Environment Prior Level of Function : Independent/Modified Independent             Mobility Comments: uses nothing in her apartment, uses [facility] when out and about, uses rollator around [employer]           OT Problem List: Cardiopulmonary status limiting activity         OT Goals(Current goals can be found in the care plan section) Acute Rehab OT Goals Patient Stated Goal: to feel better and go home         AM-PAC OT "6 Clicks" Daily Activity     Outcome Measure Help from another person eating meals?: None Help from another person taking care of personal grooming?: None Help from another person toileting, which includes using toliet, bedpan, or urinal?: None Help from another person bathing (including washing, rinsing, drying)?: None Help from another person to put on and taking off regular upper body clothing?: None Help from another person to put on and taking off regular lower body clothing?: None 6 Click Score: 24   End of Session Equipment Utilized During Treatment: Gait belt (pushing IV pole)  Activity Tolerance: Patient tolerated treatment well Patient left: in chair;with call bell/phone within reach                   Time: [id number] OT Time Calculation (min): 19 min Charges:  OT General Charges $OT Visit: 1 Visit OT Evaluation $OT Eval Moderate Complexity: 1 [place] Office [phone]    Almon Register 06/02/2022, 4:47 PM

## 2022-06-02 NOTE — H&P (Signed)
History and Physical      Rebekah Bush [id number] DOB: Sep 25, 1929 DOA: 06/01/2022  PCP: Roetta Sessions, NP  Patient coming from: facility  I have personally briefly reviewed patient's old medical records in [place]  Chief Complaint: Shortness of breath  HPI: Rebekah Bush is a 89 y.o. female with medical history significant for essential pretension, chronic peripheral edema involving the bilateral lower extremities, acquired hypothyroidism, chronic diastolic heart failure, who is admitted to [facility] on 06/01/2022 with community-acquired pneumonia after presenting from facility to [facility] ED complaining of shortness of breath.   The patient reports 1 to 2 days of new onset shortness of breath associated with new onset nonproductive cough.  Denies associated subjective fever, chills or rigors, or generalized myalgias.  No associated any dysuria, gross hematuria, Donnell pain, diarrhea, melena, or hematochezia.  Denies any associated rhinitis, rhinorrhea, sore throat, wheezing, hemoptysis.  No recent trauma or travel.  She reports that her shortness of breath is not associate any orthopnea, PND, or worsening of her known edema in the bilateral lower extremities.  She noted some intensification of her shortness of breath when attempting to do laundry today.  She also experienced some lightheadedness while coughing, which did not result in a syncopal episode nor any fall.  She denies any associated chest pain, palpitations, diaphoresis.   Medical history notable for chronic diastolic heart failure, with most recent echocardiogram in May 2013 notable for LVEF 60 to 65% with grade 1 diastolic dysfunction, mild manage regurgitation, and moderate tricuspid regurgitation.    ED Course:  Vital signs in the ED were notable for the following: Afebrile; heart rate 88-1 01; stop blood pressures initially in the 170s to 180s, says improving to 160s following interval  antihypertensive intervention, as further detailed below; respiratory rate 20-26, send saturation 97 to 100% on room air.  Labs were notable for the following: CMP notable for the following: Sodium 139, testing 3.7,, 25, creatinine 0.99 compared to most recent prior value of 0.83 in October 2022, BUN/creatinine ratio greater than 30, glucose 105, liver enzymes within normal limits.  BNP 70 compared to patient's prior value 143 in October 2022, high-sensitivity troponin I 27, without any prior high sensitive troponin I values available for reported comparison.  CBC notable for white cell count 8800 with 70% neutrophils, hemoglobin 16.3 compared to most recent prior value of 2.5 in October 2022.  Urinalysis ordered, with result currently pending.  COVID, influenza, RSV PCR ordered, with all results remaining pending at this time.  Per my interpretation, EKG in ED demonstrated the following: EKG, comparison measures to prior from 05/08/2019, shows sinus rhythm with heart rate 92, QTc prolongation with QTc of 539, no evidence of T wave changes, and less than 1 mm ST depression in V3, which appears unchanged from EKG, showing no interval development of new ST changes.  Imaging and additional notable ED work-up: CTA chest showed no evidence of acute pulmonary embolism while demonstrating airspace opacity in the right upper lobe concerning for infection, in the absence of any evidence of edema, effusion, or pneumothorax.  While in the ED, the following were administered: Doxycycline, Rocephin, do nebulizer treatment x 1, hydralazine 50 mg p.o. x 1 dose, Lopressor 5 mg IV x 1 dose.  Subsequently, the patient was admitted for further evaluation management present community-acquired pneumonia, with presentation notable for mild elevation in troponin, as well as QTc prolongation.    Review of Systems: As per HPI otherwise 10  point review of systems negative.   Past Medical History:  Diagnosis Date   Basal cell  carcinoma 08/29/2019   sclerosis on right temple   Closed dislocation of right shoulder 05/07/2019   Closed fracture of superior ramus of pubis, initial encounter ([place]) 05/07/2019   Gout    Hypertension    Hyponatremia    Retinal detachment    OD [facility]   Shoulder dislocation, right, initial encounter 05/08/2019   Squamous cell carcinoma of skin 08/29/2019   in situ on left buccal cheek   Squamous cell carcinoma of skin 08/29/2019   in situ on left upper arm, posterior   Squamous cell carcinoma of skin 08/29/2019   in situ on left forearm, posterior    Past Surgical History:  Procedure Laterality Date   CATARACT EXTRACTION Right    [facility] - 1980s    CATARACT EXTRACTION Left    [facility] Crane - [place] Right 01/28/2021   Procedure: [place] ABDOMINUS PLANE (TAP)BLOCK;  Surgeon: Michael Boston, MD;  Location: [facility] ORS;  Service: General;  Laterality: Right;   INGUINAL HERNIA REPAIR Bilateral 01/28/2021   Procedure: LAPAROSCOPIC BILATERAL INGUINAL HERNIA REPAIR, LAPAROSCOPIC RETROPSOAS HERNIA REPAIR WITH MESH;  Surgeon: Michael Boston, MD;  Location: [facility] ORS;  Service: General;  Laterality: Bilateral;   RETINAL DETACHMENT SURGERY      Social History:  reports that she quit smoking about 32 years ago. Her smoking use included cigarettes. She has never used smokeless tobacco. She reports current alcohol use. She reports that she does not use drugs.   Allergies  Allergen Reactions   Bactrim [Sulfamethoxazole-Trimethoprim] Shortness Of Breath, Nausea And Vomiting and Other (See Comments)    Headaches, also   Sulfa Antibiotics Shortness Of Breath, Nausea And Vomiting and Other (See Comments)    Headaches, also     Family History  Problem Relation Age of Onset   Ovarian cancer Mother    Pulmonary embolism Father    Hypertension Son     Family history reviewed and not pertinent     Prior to Admission medications   Medication Sig Start Date End Date Taking? Authorizing Provider  acetaminophen (TYLENOL) 500 MG tablet Take 500 mg by mouth every 6 (six) hours as needed (for pain).    [provider]  Calcium Carb-Cholecalciferol (CALTRATE 600+D3 PO) Take 2 tablets by mouth in the morning.    [provider]  Colchicine 0.6 MG CAPS Take 0.6 mg by mouth daily. Patient not taking: No sig reported 08/21/20   Collier Salina, MD  furosemide (LASIX) 20 MG tablet Take 20 mg by mouth daily. 03/03/19   [provider]  hydrALAZINE (APRESOLINE) 50 MG tablet Take 50 mg by mouth in the morning and at bedtime. 03/29/19   [provider]  levothyroxine (SYNTHROID, LEVOTHROID) 75 MCG tablet Take 75 mcg by mouth at bedtime.    [provider]  magnesium oxide (MAG-OX) 400 MG tablet Take 400 mg by mouth daily.    [provider]  metoprolol succinate (TOPROL-XL) 100 MG 24 hr tablet Take 100 mg by mouth daily. Take with or immediately following a meal.    [provider]  oxyCODONE (OXY IR/ROXICODONE) 5 MG immediate release tablet Take 1 tablet (5 mg total) by mouth every 8 (eight) hours as needed for breakthrough pain. 02/02/21   Maczis, Barth Kirks, PA-C  quinapril (ACCUPRIL) 20 MG tablet Take  20 mg by mouth every evening.    [provider]     Objective    Physical Exam: Vitals:   06/01/22 2033 06/01/22 2130 06/01/22 2142 06/01/22 2230  BP: (!) 192/79 (!) 184/83  (!) 175/97  Pulse: 96 92  85  Resp: (!) 26 (!) 21  (!) 32  Temp:   (!) 97.5 F (36.4 C)   TempSrc:   Oral   SpO2: 99% 100%  100%  Weight:      Height:        General: appears to be stated age; alert, oriented Skin: warm, dry, no rash Head:  AT/ Mouth:  Oral mucosa membranes appear dry, normal dentition Neck: supple; trachea midline Heart:  RRR; did not appreciate any M/R/G Lungs: CTAB, did not appreciate any wheezes, rales, or  rhonchi Abdomen: + BS; soft, ND, NT Vascular: 2+ pedal pulses b/l; 2+ radial pulses b/l Extremities: no peripheral edema, no muscle wasting Neuro: strength and sensation intact in upper and lower extremities b/l today   Labs on Admission: I have personally reviewed following labs and imaging studies  CBC: Recent Labs  Lab 06/01/22 1704  WBC 8.8  NEUTROABS 6.2  HGB 16.3*  HCT 49.4*  MCV 90.5  PLT [id number]   Basic Metabolic Panel: Recent Labs  Lab 06/01/22 1704  NA 139  K 3.7  CL 101  CO2 25  GLUCOSE 105*  BUN 31*  CREATININE 0.99  CALCIUM 9.8   GFR: Estimated Creatinine Clearance: 27.1 mL/min (by C-G formula based on SCr of 0.99 mg/dL). Liver Function Tests: Recent Labs  Lab 06/01/22 1704  AST 29  ALT 18  ALKPHOS 86  BILITOT 0.8  PROT 7.6  ALBUMIN 4.1   No results for input(s): "LIPASE", "AMYLASE" in the last 168 hours. No results for input(s): "AMMONIA" in the last 168 hours. Coagulation Profile: No results for input(s): "INR", "PROTIME" in the last 168 hours. Cardiac Enzymes: No results for input(s): "CKTOTAL", "CKMB", "CKMBINDEX", "TROPONINI" in the last 168 hours. BNP (last 3 results) No results for input(s): "PROBNP" in the last 8760 hours. HbA1C: No results for input(s): "HGBA1C" in the last 72 hours. CBG: No results for input(s): "GLUCAP" in the last 168 hours. Lipid Profile: No results for input(s): "CHOL", "HDL", "LDLCALC", "TRIG", "CHOLHDL", "LDLDIRECT" in the last 72 hours. Thyroid Function Tests: No results for input(s): "TSH", "T4TOTAL", "FREET4", "T3FREE", "THYROIDAB" in the last 72 hours. Anemia Panel: No results for input(s): "VITAMINB12", "FOLATE", "FERRITIN", "TIBC", "IRON", "RETICCTPCT" in the last 72 hours. Urine analysis:    Component Value Date/Time   COLORURINE YELLOW 01/28/2021 1403   APPEARANCEUR HAZY (A) 01/28/2021 1403   LABSPEC 1.017 01/28/2021 1403   PHURINE 6.0 01/28/2021 1403   GLUCOSEU NEGATIVE 01/28/2021 1403   HGBUR  SMALL (A) 01/28/2021 1403   BILIRUBINUR NEGATIVE 01/28/2021 1403   KETONESUR 20 (A) 01/28/2021 1403   PROTEINUR 30 (A) 01/28/2021 1403   UROBILINOGEN 1.0 01/23/2013 1928   NITRITE NEGATIVE 01/28/2021 1403   LEUKOCYTESUR LARGE (A) 01/28/2021 1403    Radiological Exams on Admission: CT Angio Chest PE W/Cm &/Or Wo Cm  Result Date: 06/01/2022 CLINICAL DATA:  Breathing problems. EXAM: CT ANGIOGRAPHY CHEST WITH CONTRAST TECHNIQUE: Multidetector CT imaging of the chest was performed using the standard protocol during bolus administration of intravenous contrast. Multiplanar CT image reconstructions and MIPs were obtained to evaluate the vascular anatomy. RADIATION DOSE REDUCTION: This exam was performed according to the departmental dose-optimization program which includes automated exposure control, adjustment  of the mA and/or kV according to patient size and/or use of iterative reconstruction technique. CONTRAST:  72m OMNIPAQUE IOHEXOL 350 MG/ML SOLN COMPARISON:  None Available. FINDINGS: Cardiovascular: Satisfactory opacification of the pulmonary arteries to the segmental level. No evidence of pulmonary embolism. Normal heart size. No pericardial effusion. There are atherosclerotic calcifications of the aorta. Mediastinum/Nodes: No enlarged mediastinal, hilar, or axillary lymph nodes. Thyroid gland, trachea, and esophagus demonstrate no significant findings. Lungs/Pleura: Mild emphysematous changes are present. There is scarring in both lung apices. There is a cluster of micro nodules in the posterior right upper lobe with nodules measuring up 3 mm. There is an associated ground-glass opacities in this region. There is some tree-in-bud opacities in the inferior right upper lobe as well. There is no pleural effusion or pneumothorax. There is linear scarring or atelectasis in the lung bases. Upper Abdomen: No acute findings. There is a 3 cm cyst in the left lobe of liver. Cholecystectomy clips are present.  Musculoskeletal: Degenerative changes affect the spine. Review of the MIP images confirms the above findings. IMPRESSION: 1. No evidence for pulmonary embolism. 2. Tree-in-bud opacities in the inferior right upper lobe with cluster of micro nodules in the posterior right upper lobe. Findings are favored as infectious/inflammatory. Nodules measure up to 3 mm. Per Fleischner Society Guidelines, a non-contrast Chest CT at 12 months is optional. If performed and the nodule is stable at 12 months, no further follow-up is recommended. These guidelines do not apply to immunocompromised patients and patients with cancer. Follow up in patients with significant comorbidities as clinically warranted. For lung cancer screening, adhere to Lung-RADS guidelines. Reference: Radiology. 2017; 284(1):228-43. Aortic Atherosclerosis (ICD10-I70.0) and Emphysema (ICD10-J43.9). Electronically Signed   By: ARonney AstersM.D.   On: 06/01/2022 23:18   DG Chest Portable 1 View  Result Date: 06/01/2022 CLINICAL DATA:  Shortness of breath. EXAM: PORTABLE CHEST 1 VIEW COMPARISON:  February 01, 2021. FINDINGS: The heart size and mediastinal contours are within normal limits. Both lungs are clear. The visualized skeletal structures are unremarkable. IMPRESSION: No active disease. Electronically Signed   By: JMarijo ConceptionM.D.   On: 06/01/2022 17:19      Assessment/Plan   Principal Problem:   CAP (community acquired pneumonia) Active Problems:   Acquired hypothyroidism   QT prolongation   Elevated troponin   Postural dizziness with presyncope   Dehydration   Essential hypertension   Chronic diastolic CHF (congestive heart failure) (HCC)     #) Community-acquired pneumonia, diagnosed on the basis of 1 to 2 days of new onset shortness of breath associated with new onset nonproductive cough, with CTA chest showing evidence of airspace opacity in the right upper lobe consistent with infection.  Of note, in the absence of  objective fever or leukocytosis, SIRS criteria not met for sepsis at this time.  No evidence of additional infectious process at this time, although urinalysis is pending at the present juncture.  In the setting of her QTc pronation, the patient was started on doxycycline and Rocephin for community-acquired pneumonia coverage, which will be continued.  Plan: Continue Rocephin and doxycycline, as above.  Add on procalcitonin level.  Incentive spirometry, strep urine antigen in, follow-up result urinalysis.  Repeat CMP and CBC in the morning.  Prn Tessalon Perles.           #) Elevated troponin: mildly elevated initial troponin of 27. No prior high sensitivity troponin I value available for point of comparison.  Suspect that this mildly  elevated troponin is on the basis of supply demand mismatch in the setting of presenting community-acquired pneumonia, as opposed to representing a type I process due to acute plaque rupture. Additionally, relative decline in renal clearance of troponin in the setting of interval increase in serum creatinine, quantified above, is likely also contributing to presenting troponin elevation. EKG shows no evidence of acute ischemic changes, including no evidence of STEMI.   Additionally, presentation is not associated with any CP.  Overall, ACS is felt to be less likely relative to type 2 supply demand mismatch, as above, but will closely monitor on telemetry overnight while treating suspected underlying community-acquired pneumonia, as further described above.  Will continue to trend troponin, and pursue updated echocardiogram, as most recent echocardiogram occurred nearly 10 years ago.   Plan: repeat troponin in the AM. Monitor on telemetry. PRN EKG for development of chest pain. PRN sublingual nitroglycerin for CP. Check serum Mg level and repeat CMP in the morning.  Repeat CBC in the AM. Additional evaluation and management of presenting CAP As suspected driving force  behind mildly elevated troponin, as above.  Trend troponin.  Echocardiogram ordered for the morning.  Continue home ACE inhibitor and beta-blocker.           #) Presyncope: Single episode of presyncope that occurred following recent positional change and associated with coughing, which appears most consistent with cough induced presyncope with orthostatic exacerbation, given predisposition in the setting of evidence of mild intravascular depletion/dehydration, with pharmacologic contribution from diminished compensatory tachycardia/vasoconstriction in the setting of beta-blocker use as well as multiple hypertensive medications associated with vasodilation, including home hydralazine and quinapril.  As described above, appears less likely to be associate with ACS, but will continue to trend troponin and pursue echocardiogram in the morning.  Additionally, CTA shows no evidence of acute pulmonary embolism.  Plan: Will check orthostatic vital signs followed by initiation of very gentle IV fluids in the form of lactated Ringer's at 50 cc/h x 8 hours.  Monitor strict I's and O's and daily weights.  Trend troponin.  Echocardiogram ordered in the morning.  Add on some additional will.  Monitor on telemetry.  Fall precautions ordered.               #) QTc prolongation: Presenting EKG demonstrates QTc of 539 ms. outpatient medications that may be contributing to QTc prolongation: None.    Plan: Monitor on telemetry.  Add-on serum magnesium level.  Repeat EKG in the morning to monitor interval degree of QTc prolongation.              #) Dehydration: Clinical suspicion for such, including the appearance of dry oral mucous membranes as well as laboratory findings notable for acute prerenal azotemia, now with presenting BUN current ratio greater than 30, and a nearly 4 point increase in interval hemoglobin values, consistent with hemoconcentration as consequence of dehydration.  No  e/o associated hypotension.   Plan: Monitor strict I's and O's.  Daily weights.  Repeat BMP in the morning.  Will IV fluids overnight, as further detailed above.  Hold home Lasix for now.               #) acquired hypothyroidism: documented h/o such, on Synthroid as outpatient.   Plan: cont home Synthroid.    Marland Kitchen                #) Essential Hypertension: documented h/o such, with outpatient antihypertensive regimen including metoprolol succinate, quinapril, hydralazine, Lasix.Marland Kitchen  SBP's in the ED today: Initially in the 170s to 180s, associated decreasing into the 160s following resumption of home hydralazine.  Of note, in the context of clinical evidence of dehydration, will hold next dose of Lasix for now.  Plan: Close monitoring of subsequent BP via routine VS. continue home beta-blocker, quinapril, and hydralazine.  Hold next dose of Lasix, as further detailed above.  Monitor strict I's and O's and daily weights.                #) Chronic diastolic heart failure: documented history of such, with most recent echocardiogram performed in May 2013, which is notable for LVEF 60 to 65%, as well as grade 1 diastolic dysfunction, with additional details as conveyed above. No clinical or radiographic evidence to suggest acutely decompensated heart failure at this time, while BNP is less than half of most recent prior value. home diuretic regimen reportedly consists of the following: Lasix 20 mg p.o. daily.  While no evidence to suggest acutely to consider further, we will pursue updated echocardiogram in the context of an element of presyncope associated with this evening's presentation, will noting it has been the most 10 years since most recent prior echo.   Plan: monitor strict I's & O's and daily weights. Repeat CMP in AM. Check serum mag level.  Hold next dose of Lasix, as above.  Echo in the morning.      DVT prophylaxis: SCD's   Code Status: Full  code Family Communication: none Disposition Plan: Per Rounding Team Consults called: none;  Admission status: Inpatient     I SPENT GREATER THAN 75  MINUTES IN CLINICAL CARE TIME/MEDICAL DECISION-MAKING IN COMPLETING THIS ADMISSION.      [place] DO [facility]  From [place]   06/02/2022, 12:04 AM

## 2022-06-02 NOTE — Progress Notes (Addendum)
Rebekah Bush  [id number] DOB: 1929/04/23 DOA: 06/01/2022 PCP: Roetta Sessions, NP    Brief Narrative:  89 year old SNF resident with a history of HTN, B LE edema, hypothyroidism, and chronic diastolic CHF who was sent to the ER due to 1-2 days of progressive shortness of breath.  CTa chest noted no evidence of PE but demonstrated airspace opacities in the right upper lobe worrisome for pneumonia.  Consultants:  None  Goals of Care:  Code Status: Full Code   DVT prophylaxis: SCDs  Interim Hx: The patient was examined and interviewed by one of my partners earlier today.  Presently afebrile with stable vital signs and oxygen saturation 97% on room air. Reports a hx of progressive SOB w/ exertion over several weeks.   Assessment & Plan:  Possible bacterial pneumonia right upper lobe - nodular inflammation RUL Initial procalcitonin reassuring at <0.10 - continue empiric antibiotic for now but if procalcitonin remains very low on recheck in a.m. will discontinue - no evidence of PE on CTa   DOE Perhaps simply age related deconditioning - TTE notes EF 60-65% w/ no WMA and only grade 1 DD w/ no signif valvular abnormalities   Mildly elevated troponin Not clinically significant - no hx of chest pain, even w/ DOE   Prolonged QTc QTc 539 ms at time of presentation -maximize electrolytes and follow on telemetry  Hypokalemia Likely due to use of diuretic and poor oral intake - replace and hold diuretic for now  Relative hypomagnesemia Magnesium below ideal at 1.7 in setting of hypokalemia -likely due to use of diuretic and poor intake -supplement  Clinical dehydration Continue to gently hydrate  Hypothyroidism Continue usual Synthroid dose  HTN  Chronic diastolic CHF Presently appears volume depleted therefore gently hydrating -TTE May 2013 noted EF 60-65% with grade 1 diastolic dysfunction - f/u TTE this admit w/o change    Family Communication: spoke w/ daughter  Heloisa Nirenberg, NP via phone  Disposition:  from ILF - anticipate return to ILF    Objective: Blood pressure (!) 168/78, pulse 92, temperature 97.6 F (36.4 C), temperature source Oral, resp. rate 20, height 5' (1.524 m), weight 52.2 kg, SpO2 100 %. No intake or output data in the 24 hours ending 06/02/22 [id number] Filed Weights   06/01/22 1633  Weight: 52.2 kg    Examination: Patient was examined by one of my partners earlier today.  CBC: Recent Labs  Lab 06/01/22 1704 06/02/22 0018  WBC 8.8 11.5*  NEUTROABS 6.2 8.9*  HGB 16.3* 14.5  HCT 49.4* 44.9  MCV 90.5 91.4  PLT 289 [id number]   Basic Metabolic Panel: Recent Labs  Lab 06/01/22 1704 06/02/22 0018 06/02/22 0140  NA 139 137  --   K 3.7 3.0*  --   CL 101 102  --   CO2 25 25  --   GLUCOSE 105* 134*  --   BUN 31* 24*  --   CREATININE 0.99 0.87  --   CALCIUM 9.8 9.1  --   MG  --  1.7 1.7  PHOS  --  3.1  --    GFR: Estimated Creatinine Clearance: 30.9 mL/min (by C-G formula based on SCr of 0.87 mg/dL).   Scheduled Meds:  hydrALAZINE  50 mg Oral BID   levothyroxine  75 mcg Oral QHS   lisinopril  20 mg Oral QPM   metoprolol succinate  100 mg Oral Daily   Continuous Infusions:  cefTRIAXone (ROCEPHIN)  IV  doxycycline (VIBRAMYCIN) IV     lactated ringers 50 mL/hr at 06/02/22 0108     LOS: 0 days   Cherene Altes, MD [facility] Office  [phone] Pager - Text Page per Amion  If 7PM-7AM, please contact night-coverage per Amion 06/02/2022, 8:12 AM

## 2022-06-03 ENCOUNTER — Inpatient Hospital Stay (HOSPITAL_COMMUNITY): Payer: Medicare Other

## 2022-06-03 DIAGNOSIS — M7989 Other specified soft tissue disorders: Secondary | ICD-10-CM

## 2022-06-03 DIAGNOSIS — R0609 Other forms of dyspnea: Secondary | ICD-10-CM | POA: Diagnosis not present

## 2022-06-03 LAB — BASIC METABOLIC PANEL
Anion gap: 11 (ref 5–15)
BUN: 14 mg/dL (ref 8–23)
CO2: 21 mmol/L — ABNORMAL LOW (ref 22–32)
Calcium: 9 mg/dL (ref 8.9–10.3)
Chloride: 105 mmol/L (ref 98–111)
Creatinine, Ser: 0.82 mg/dL (ref 0.44–1.00)
GFR, Estimated: >60 mL/min (ref >60)
Glucose, Bld: 91 mg/dL (ref 70–99)
Potassium: 3.9 mmol/L (ref 3.5–5.1)
Sodium: 137 mmol/L (ref 135–145)

## 2022-06-03 LAB — CBC
HCT: 43.3 % (ref 36.0–46.0)
Hemoglobin: 14.6 g/dL (ref 12.0–15.0)
MCH: 30 pg (ref 26.0–34.0)
MCHC: 33.7 g/dL (ref 30.0–36.0)
MCV: 88.9 fL (ref 80.0–100.0)
Platelets: 268 10*3/uL (ref 150–400)
RBC: 4.87 MIL/uL (ref 3.87–5.11)
RDW: 14.5 % (ref 11.5–15.5)
WBC: 8.3 10*3/uL (ref 4.0–10.5)
nRBC: 0 % (ref 0.0–0.2)

## 2022-06-03 LAB — MAGNESIUM: Magnesium: 1.9 mg/dL (ref 1.7–2.4)

## 2022-06-03 LAB — PROCALCITONIN: Procalcitonin: <0.1 ng/mL

## 2022-06-03 LAB — D-DIMER, QUANTITATIVE: D-Dimer, Quant: 0.58 ug/mL-FEU — ABNORMAL HIGH (ref 0.00–0.50)

## 2022-06-03 MED ORDER — HYDRALAZINE HCL 50 MG PO TABS
50.0000 mg | ORAL_TABLET | Freq: Two times a day (BID) | ORAL | Status: DC
  Administered 2022-06-03 – 2022-06-04 (×4): 50 mg via ORAL
  Filled 2022-06-03 (×4): qty 1

## 2022-06-03 NOTE — Evaluation (Signed)
Physical Therapy Evaluation Patient Details Name: Rebekah Bush MRN: [id number] DOB: December 01, 1929 Today's Date: 06/03/2022  History of Present Illness  Rebekah Bush is a 89 y.o.female admitted to [facility] on 06/01/2022 with community-acquired pneumonia after presenting from facility to [facility] ED complaining of shortness of breath. PHMx: essential pretension, chronic peripheral edema involving the bilateral lower extremities, acquired hypothyroidism, chronic diastolic heart failure  Clinical Impression  Pt presents today functioning close to her mobility baseline. Pt able to perform bed mobility and transfers with modified independence with supervision provided for ambulation, although no LOB or unsteadiness noted during today's session. Pt educated on RW use for today's session as pt utilizes a [facility] or rollator at baseline, but pt ambulating in the hallways and room with no concerns. Pt reports feeling better than yesterday but not quick back to her baseline, mildly fatigued after hallway ambulation but no increase WOB, recovering quickly with brief seated rest break. Educated pt on continued mobilization during admission, as well as sitting up in the chair, pt verbalized understanding, ended session with pt seated in chair. Pt without strength or balance deficits today, anticipate quick progression back to baseline. Pt with no further skilled acute PT at this time, recommend returning home at discharge with no current need for PT at discharge.        Recommendations for follow up therapy are one component of a multi-disciplinary discharge planning process, led by the attending physician.  Recommendations may be updated based on patient status, additional functional criteria and insurance authorization.  Follow Up Recommendations No PT follow up      Assistance Recommended at Discharge PRN  Patient can return home with the following  Assist for transportation;Help with stairs or ramp for  entrance    Equipment Recommendations None recommended by PT  Recommendations for Other Services       Functional Status Assessment Patient has had a recent decline in their functional status and demonstrates the ability to make significant improvements in function in a reasonable and predictable amount of time.     Precautions / Restrictions Precautions Precautions: Fall Restrictions Weight Bearing Restrictions: No      Mobility  Bed Mobility Overal bed mobility: Modified Independent             General bed mobility comments: HOB elevated and use of bed rail but no assistance required    Transfers Overall transfer level: Modified independent Equipment used: Rolling walker (2 wheels)               General transfer comment: use of RW for mobility, educated on hand placement prior to transfer and pt completing transfers from bed and toilet with no issue, use of RW from bed and grab bar utilized in the bathroom    Ambulation/Gait Ambulation/Gait assistance: Supervision Gait Distance (Feet): 75 Feet (x25 in room for toileting and then to chair) Assistive device: Rolling walker (2 wheels) Gait Pattern/deviations: Step-through pattern, Trunk flexed Gait velocity: WFL     General Gait Details: slightly increased distance from RW with ambulation and downward gaze but correcting with comfort with RW and increased ambulation  Stairs            Wheelchair Mobility    Modified Rankin (Stroke Patients Only)       Balance Overall balance assessment: Needs assistance Sitting-balance support: No upper extremity supported, Feet supported Sitting balance-Leahy Scale: Normal Sitting balance - Comments: stable while seated EOB for donning/doffing socks   Standing balance  support: Bilateral upper extremity supported, During functional activity Standing balance-Leahy Scale: Fair Standing balance comment: utilizing RW for ambulation today by request, standing  statically and able to perform pericare with no UE support but utilized with ambulation                             Pertinent Vitals/Pain Pain Assessment Pain Assessment: No/denies pain    Home Living Family/patient expects to be discharged to:: Private residence Living Arrangements: Alone Available Help at Discharge: Family;Available PRN/intermittently Type of Home: Independent living facility (at [facility] (main building first floor)) Home Access: Level entry       Home Layout: One level Home Equipment: Rollator (4 wheels);Cane - single point;Shower seat Additional Comments: family provides transportation and available for assistance as needed    Prior Function Prior Level of Function : Independent/Modified Independent             Mobility Comments: independent with mobility in her home, utilizes [facility] outside and intermittent rollator use       Hand Dominance   Dominant Hand: Right    Extremity/Trunk Assessment   Upper Extremity Assessment Upper Extremity Assessment: Defer to OT evaluation    Lower Extremity Assessment Lower Extremity Assessment: Overall WFL for tasks assessed    Cervical / Trunk Assessment Cervical / Trunk Assessment: Normal  Communication   Communication: No difficulties  Cognition Arousal/Alertness: Awake/alert Behavior During Therapy: WFL for tasks assessed/performed Overall Cognitive Status: Within Functional Limits for tasks assessed                                 General Comments: A&Ox4, pleasant throughout session        General Comments General comments (skin integrity, edema, etc.): pt reports mild fatigue after first ambulation bout on room air but no issues after brief seated rest break    Exercises     Assessment/Plan    PT Assessment Patient does not need any further PT services  PT Problem List         PT Treatment Interventions      PT Goals (Current goals can be found in the Care  Plan section)  Acute Rehab PT Goals Patient Stated Goal: go home PT Goal Formulation: All assessment and education complete, DC therapy Potential to Achieve Goals: Good    Frequency       Co-evaluation               AM-PAC PT "6 Clicks" Mobility  Outcome Measure Help needed turning from your back to your side while in a flat bed without using bedrails?: None Help needed moving from lying on your back to sitting on the side of a flat bed without using bedrails?: None Help needed moving to and from a bed to a chair (including a wheelchair)?: None Help needed standing up from a chair using your arms (e.g., wheelchair or bedside chair)?: None Help needed to walk in hospital room?: None Help needed climbing 3-5 steps with a railing? : A Little 6 Click Score: 23    End of Session   Activity Tolerance: Patient tolerated treatment well Patient left: in chair;with call bell/phone within reach;Other (comment) (with personnel for EKG) Nurse Communication: Mobility status PT Visit Diagnosis: Difficulty in walking, not elsewhere classified (R26.2)    Time: [id number] PT Time Calculation (min) (ACUTE ONLY): 22 min   Charges:  PT Evaluation $PT Eval Low Complexity: 1 Low          Charlynne Cousins, PT DPT Acute Rehabilitation Services Office [phone]   Luvenia Heller 06/03/2022, 1:05 PM

## 2022-06-03 NOTE — Progress Notes (Signed)
Mobility Specialist Progress Note   06/03/22 1437  Mobility  Activity Ambulated with assistance in hallway  Level of Assistance Contact guard assist, steadying assist  Assistive Device Front wheel walker  Distance Ambulated (ft) 184 ft  Activity Response Tolerated well  Mobility Referral Yes  $Mobility charge 1 Mobility   Received pt in bed having no complaints and agreeable to mobility. Pt was asymptomatic throughout ambulation and returned to room w/o fault. Left sitting EOB w/ slight SOB but feeling better after practicing PLB. placed call bell in reach and all needs were met.  Holland Falling Mobility Specialist Please contact via SecureChat or  Rehab office at [phone]

## 2022-06-03 NOTE — Progress Notes (Signed)
Rebekah Bush  [id number] DOB: [birth year] DOA: 06/01/2022 PCP: Roetta Sessions, NP    Brief Narrative:  89 year old SNF resident with a history of HTN, B LE edema, hypothyroidism, and chronic diastolic CHF who was sent to the ER due to 1-2 days of progressive shortness of breath.  CTa chest noted no evidence of PE but demonstrated airspace opacities in the right upper lobe worrisome for pneumonia.  Consultants:  None  Goals of Care:  Code Status: Full Code   DVT prophylaxis: SCDs  Interim Hx: No acute events reported overnight.  Blood pressure elevated last night with systolics up to 0000000, but patient was entirely asymptomatic.  Vital signs otherwise stable.  Afebrile.  Overall the patient states she feels much better, though not quite back to her baseline when she attempts to ambulate.  She and I had a very long discussion and a detailed the full extent of our thorough evaluation.  I explained to her that I do not wish for her to be overly concerned with her blood pressure, and that given her age I would actually prefer that we liberalize our goal for her blood pressure control so as to avoid episodic hypotension, which could in fact be contributing to her presenting symptoms.  Assessment & Plan:  Possible bacterial pneumonia right upper lobe ruled out - nodular inflammation RUL Procalcitonin <0.10 on 2 subsequent checks and in absence of fever, elevated white count, or clear symptoms suggestive of a pulmonary infection I do not feel this patient clinically has a pneumonia and therefore I have stopped her antibiotics  DOE Perhaps simply age related deconditioning - TTE notes EF 60-65% w/ no WMA and only grade 1 DD w/ no signif valvular abnormalities -have encouraged the patient to continue to ambulate as she has been doing, but to simply pause and give herself a moment to rest when she feels slightly dyspneic  Mildly elevated troponin Not clinically significant - no hx of chest  pain, even w/ DOE -serial EKG is without worrisome findings  Prolonged QTc QTc 539 ms at time of presentation -corrected on follow-up EKG with maximization of electrolytes  Hypokalemia Likely due to use of diuretic and poor oral intake -corrected with supplementation -avoid further use of diuretic as able  Relative hypomagnesemia Magnesium below ideal at 1.7 in setting of hypokalemia -likely due to use of diuretic and poor intake -corrected with supplementation  Clinical dehydration Corrected with gentle hydration -avoid further use of diuretic as able  Very mildly elevated D-dimer CTa chest already negative for PE -venous duplex bilateral lower extremities obtained given some transient edema and were fortunately negative for DVT  Hypothyroidism Continue usual Synthroid dose  HTN Given advanced age and very favorable TTE I do not feel there is any role for very strict blood pressure control, and in fact I worry that the patient may be developing symptoms from over aggressively treated blood pressure leading to episodes of hypotension -I have discussed this with her at length and she voices understanding -attempt to simplify blood pressure medication regimen  Chronic diastolic CHF Presently appears volume depleted therefore gently hydrating -TTE May 2013 noted EF 60-65% with grade 1 diastolic dysfunction - f/u TTE this admit w/o change    Family Communication:   Disposition:  from ILF - anticipate return to ILF    Objective: Blood pressure (!) 149/69, pulse 64, temperature (!) 97.5 F (36.4 C), temperature source Oral, resp. rate 12, height 5' (1.524 m), weight 52.2 kg, SpO2  94 %.  Intake/Output Summary (Last 24 hours) at 06/03/2022 1023 Last data filed at 06/03/2022 0900 Gross per 24 hour  Intake 240 ml  Output 550 ml  Net -310 ml   Filed Weights   06/01/22 1633  Weight: 52.2 kg    Examination: General: No acute respiratory distress Lungs: Clear to auscultation  bilaterally without wheezes or crackles Cardiovascular: Regular rate and rhythm without murmur gallop or rub normal S1 and S2 Abdomen: Nontender, nondistended, soft, bowel sounds positive, no rebound, no ascites, no appreciable mass Extremities: No significant cyanosis, clubbing, or edema bilateral lower extremities   CBC: Recent Labs  Lab 06/01/22 1704 06/02/22 0018 06/03/22 0510  WBC 8.8 11.5* 8.3  NEUTROABS 6.2 8.9*  --   HGB 16.3* 14.5 14.6  HCT 49.4* 44.9 43.3  MCV 90.5 91.4 88.9  PLT 289 259 [id number]    Basic Metabolic Panel: Recent Labs  Lab 06/01/22 1704 06/02/22 0018 06/02/22 0140 06/03/22 0510  NA 139 137  --  137  K 3.7 3.0*  --  3.9  CL 101 102  --  105  CO2 25 25  --  21*  GLUCOSE 105* 134*  --  91  BUN 31* 24*  --  14  CREATININE 0.99 0.87  --  0.82  CALCIUM 9.8 9.1  --  9.0  MG  --  1.7 1.7 1.9  PHOS  --  3.1  --   --     GFR: Estimated Creatinine Clearance: 32.8 mL/min (by C-G formula based on SCr of 0.82 mg/dL).   Scheduled Meds:  hydrALAZINE  50 mg Oral BID   levothyroxine  75 mcg Oral QHS   metoprolol succinate  100 mg Oral Daily   Continuous Infusions:  cefTRIAXone (ROCEPHIN)  IV 1 g (06/02/22 2234)   doxycycline (VIBRAMYCIN) IV 100 mg (06/03/22 1006)   lactated ringers 75 mL/hr at 06/02/22 1240     LOS: 1 day   Cherene Altes, MD [facility] Office  [phone] Pager - Text Page per Shea Evans  If 7PM-7AM, please contact night-coverage per Amion 06/03/2022, 10:23 AM

## 2022-06-03 NOTE — Progress Notes (Signed)
Contacted Dr. Thereasa Solo related to patient had a second IV placed this morning at 1000 due to the other site was leaking. The new IV site began leaking while doxycycline was running.  Dr. Thereasa Solo advised I could hold the doxycycline until he got her procalcitonin labs resulted.  Dr. Thereasa Solo discontinued the doxycycline and I have removed the IV.  Patient currently does not have an PIV.

## 2022-06-03 NOTE — Plan of Care (Signed)

## 2022-06-03 NOTE — Progress Notes (Signed)
Lower extremity venous bilateral study completed.   Please see CV Proc for preliminary results.   Whyatt Klinger, RDMS, RVT  

## 2022-06-04 NOTE — Care Management Important Message (Signed)
Important Message  Patient Details  Name: Rebekah Bush MRN: [id number] Date of Birth: 1929-06-22   Medicare Important Message Given:  Yes     Orbie Pyo 06/04/2022, 2:46 PM

## 2022-06-04 NOTE — Discharge Summary (Signed)
DISCHARGE SUMMARY  Rebekah Bush  MR#: [id number]  DOB:16-Sep-1929  Date of Admission: 06/01/2022 Date of Discharge: 06/04/2022  Attending Physician:Cyann Venti Hennie Duos, MD  Patient's BR:[id number], [name], NP  Consults: None  Disposition: Discharge home to ILF  Follow-up Appts:  Follow-up Information     Roetta Sessions, NP Follow up in 1 week(s).   Specialty: Nurse Practitioner Contact information: [address] [place] [address] [phone]                 Tests Needing Follow-up: -recheck of BP with goal for liberalization of BP target to avoid episodic hypotension / orthostasis in patient of advanced age  -note ACEi and diuretic have been discontinued   Discharge Diagnoses: Nodular inflammation RUL - nonspecific  DOE - deconditioning  Prolonged QTc Hypokalemia Relative hypomagnesemia Clinical dehydration Hypothyroidism HTN Chronic diastolic CHF   Initial presentation: 89 year old SNF resident with a history of HTN, B LE edema, hypothyroidism, and chronic diastolic CHF who was sent to the ER due to 1-2 days of progressive shortness of breath. CTa chest noted no evidence of PE but demonstrated airspace opacities in the right upper lobe worrisome for pneumonia.   Hospital Course:  Possible bacterial pneumonia right upper lobe ruled out - nodular inflammation RUL Procalcitonin <0.10 on 2 subsequent checks and in absence of fever, elevated white count, or clear symptoms suggestive of a pulmonary infection I do not feel this patient clinically has a pneumonia and therefore I have stopped her antibiotics - she remained stable off antibiotics, with no sx at d/c to suggest an infection    DOE Perhaps simply age related deconditioning - TTE notes EF 60-65% w/ no WMA and only grade 1 DD w/ no signif valvular abnormalities - have encouraged the patient to continue to ambulate as she has been doing, but to simply pause and give herself  a moment to rest when she feels slightly dyspneic   Mildly elevated troponin Not clinically significant - no hx of chest pain, even w/ DOE -serial EKG is without worrisome findings   Prolonged QTc QTc 539 ms at time of presentation - corrected on follow-up EKG with maximization of electrolytes   Hypokalemia Likely due to use of diuretic and poor oral intake - corrected with supplementation - avoid further use of diuretic as able   Relative hypomagnesemia Magnesium below ideal at 1.7 in setting of hypokalemia - likely due to use of diuretic and poor intake - corrected with supplementation   Clinical dehydration Corrected with gentle hydration - avoid further use of diuretic as able   Very mildly elevated D-dimer CTa chest negative for PE - venous duplex bilateral lower extremities obtained given some transient edema and were fortunately negative for DVT   Hypothyroidism Continue usual Synthroid dose   HTN Given advanced age and very favorable TTE I do not feel there is any role for very strict blood pressure control, and in fact I worry that the patient may be developing symptoms from over aggressively treated blood pressure leading to episodes of hypotension - I have discussed this with her at length and she voices understanding - attempt to simplify blood pressure medication regimen   Chronic diastolic CHF Presently appears volume depleted therefore gently hydrating -TTE May 2013 noted EF 60-65% with grade 1 diastolic dysfunction - f/u TTE this admit w/o change - clinically speaking she does not have a significant degree of actual CHF    Allergies as of 06/04/2022  Reactions   Bactrim [sulfamethoxazole-trimethoprim] Shortness Of Breath, Nausea And Vomiting, Other (See Comments)   Headaches   Sulfa Antibiotics Shortness Of Breath, Nausea And Vomiting, Other (See Comments)   Headaches         Medication List     STOP taking these medications    furosemide 20 MG  tablet Commonly known as: LASIX   lisinopril 20 MG tablet Commonly known as: ZESTRIL       TAKE these medications    hydrALAZINE 50 MG tablet Commonly known as: APRESOLINE Take 50 mg by mouth in the morning and at bedtime.   ibuprofen 200 MG tablet Commonly known as: ADVIL Take 200 mg by mouth daily as needed for moderate pain.   levothyroxine 75 MCG tablet Commonly known as: SYNTHROID Take 75 mcg by mouth daily.   magnesium oxide 400 MG tablet Commonly known as: MAG-OX Take 400 mg by mouth daily.   metoprolol succinate 100 MG 24 hr tablet Commonly known as: TOPROL-XL Take 100 mg by mouth See admin instructions. 100 mg once daily in the evening. HOLD for low blood pressure.        Day of Discharge BP (!) 149/64 (BP Location: Left Arm)   Pulse 87   Temp (!) 97.4 F (36.3 C) (Oral)   Resp 18   Ht 5' (1.524 m)   Wt 52.2 kg   SpO2 94%   BMI 22.46 kg/m   Physical Exam: General: No acute respiratory distress Lungs: Clear to auscultation bilaterally without wheezes or crackles Cardiovascular: Regular rate and rhythm without murmur gallop or rub normal S1 and S2 Abdomen: Nontender, nondistended, soft, bowel sounds positive, no rebound, no ascites, no appreciable mass Extremities: No significant cyanosis, clubbing, or edema bilateral lower extremities  Basic Metabolic Panel: Recent Labs  Lab 06/01/22 1704 06/02/22 0018 06/02/22 0140 06/03/22 0510  NA 139 137  --  137  K 3.7 3.0*  --  3.9  CL 101 102  --  105  CO2 25 25  --  21*  GLUCOSE 105* 134*  --  91  BUN 31* 24*  --  14  CREATININE 0.99 0.87  --  0.82  CALCIUM 9.8 9.1  --  9.0  MG  --  1.7 1.7 1.9  PHOS  --  3.1  --   --    CBC: Recent Labs  Lab 06/01/22 1704 06/02/22 0018 06/03/22 0510  WBC 8.8 11.5* 8.3  NEUTROABS 6.2 8.9*  --   HGB 16.3* 14.5 14.6  HCT 49.4* 44.9 43.3  MCV 90.5 91.4 88.9  PLT 289 259 268     Time spent in discharge (includes decision making & examination of pt): 35  minutes  06/04/2022, 10:37 AM   Cherene Altes, MD [facility] Office  [phone]

## 2022-06-04 NOTE — Progress Notes (Signed)
Mobility Specialist Progress Note   06/04/22 1200  Mobility  Activity Ambulated with assistance in hallway  Level of Assistance Contact guard assist, steadying assist  Assistive Device Front wheel walker  Distance Ambulated (ft) 450 ft  Activity Response Tolerated well  Mobility Referral Yes  $Mobility charge 1 Mobility   Pt agreeable to mobility. Pt having a steady gait w/ RW, trialed w/o AD and pt remaining steady while using rai;ing. Returned back to the room w/o fault. Now awaiting d/c.    Rebekah Bush Mobility Specialist Please contact via SecureChat or  Rehab office at [phone]

## 2022-07-22 IMAGING — CT CT ABD-PELV W/ CM
2 of 5 series · 13 of 46 positions shown, 15 images · IV contrast (omnipaque)
Comparison: None.
COMPARISON: None.

Addendum:
CLINICAL DATA: Abdominal abscess/infection suspected. abdominal
pain RLQ x 24 hours and nausea, no rebound tenderness, soft abdomen

EXAM:
CT ABDOMEN AND PELVIS WITH CONTRAST
TECHNIQUE: Multidetector CT imaging of the abdomen and pelvis was performed
using the standard protocol following bolus administration of
intravenous contrast.
CONTRAST:  60mL OMNIPAQUE IOHEXOL 350 MG/ML SOLN

[Series 2: axial st · axial · 0.77mm/px · z∈[+950,+1285]mm · 10 of 81 slices shown, 12 images]
[im 7/81  soft-tissue]
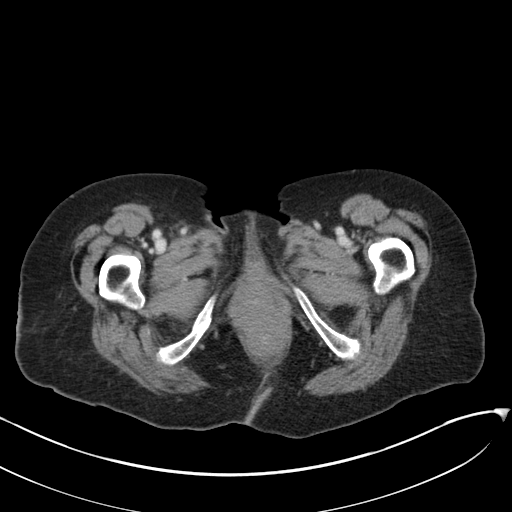
[im 7/81  bone]
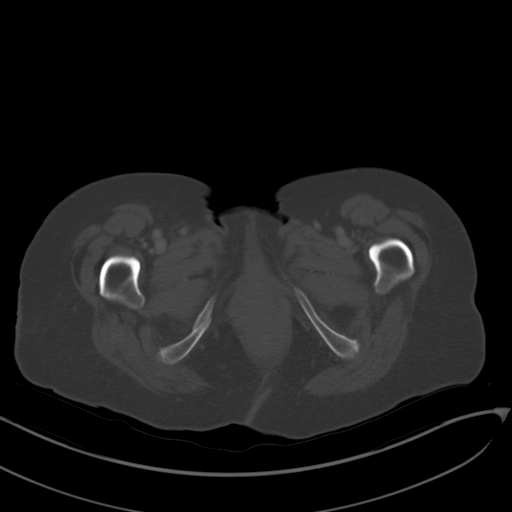
[im 14/81  soft-tissue]
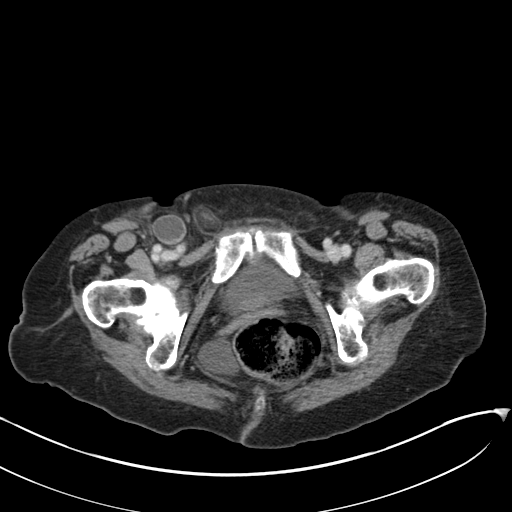
[im 21/81  soft-tissue]
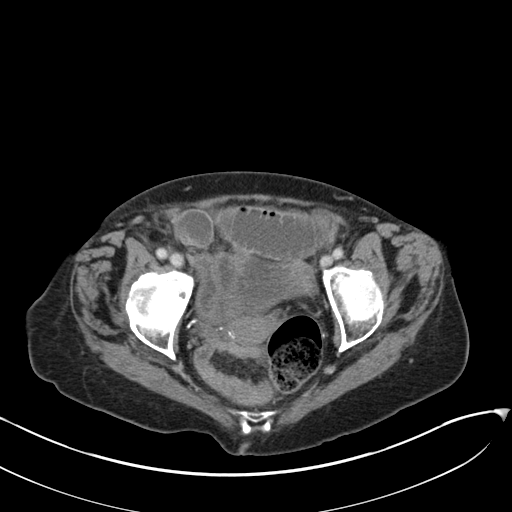
[im 27/81  soft-tissue]
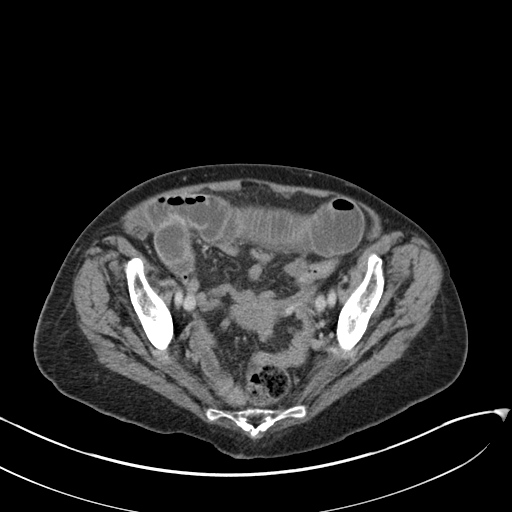
[im 34/81  soft-tissue]
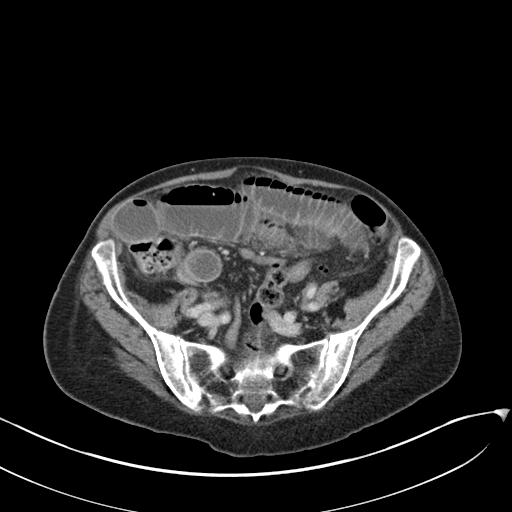
[im 47/81  soft-tissue]
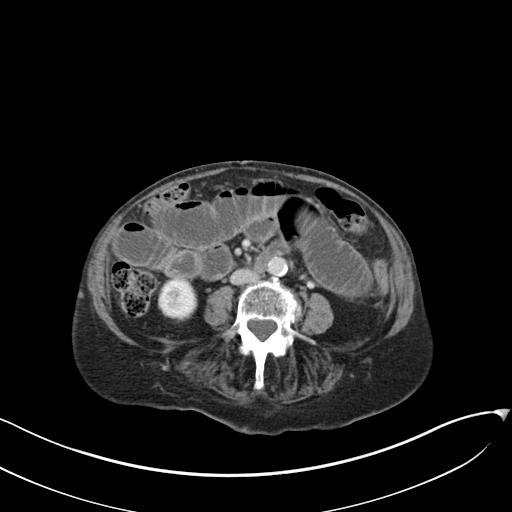
[im 54/81  soft-tissue]
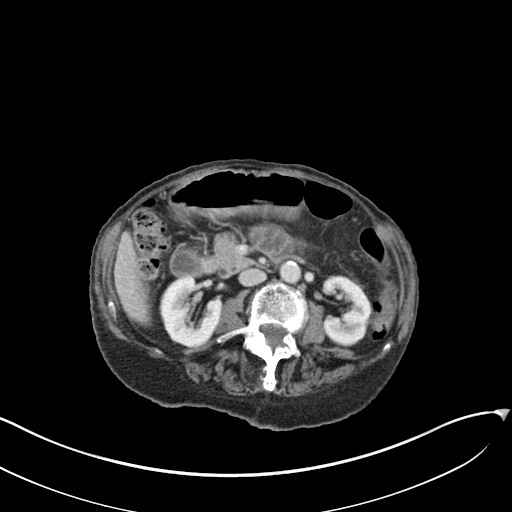
[im 61/81  soft-tissue]
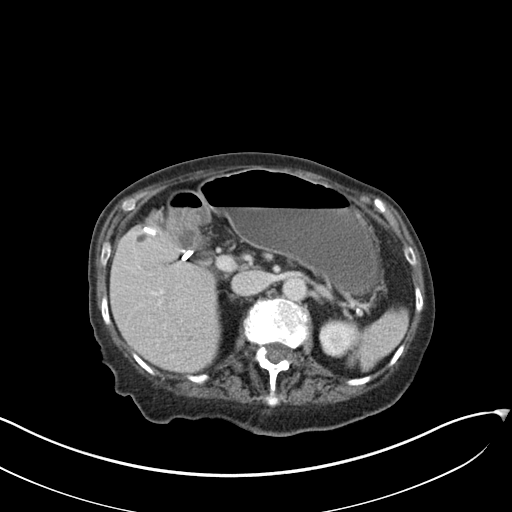
[im 67/81  soft-tissue]
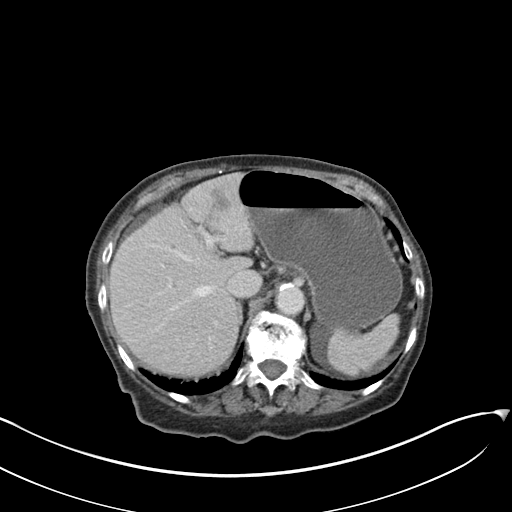
[im 67/81  bone]
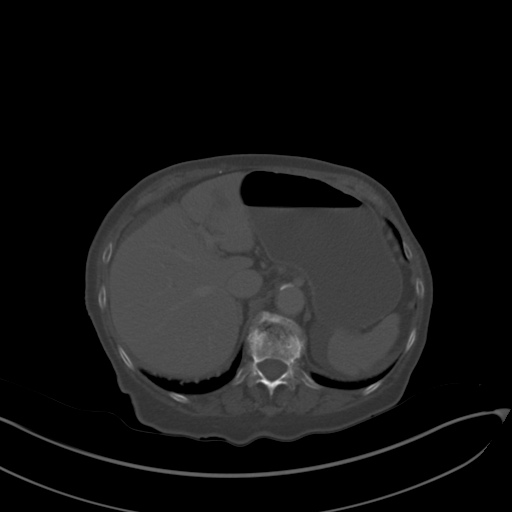
[im 74/81  soft-tissue]
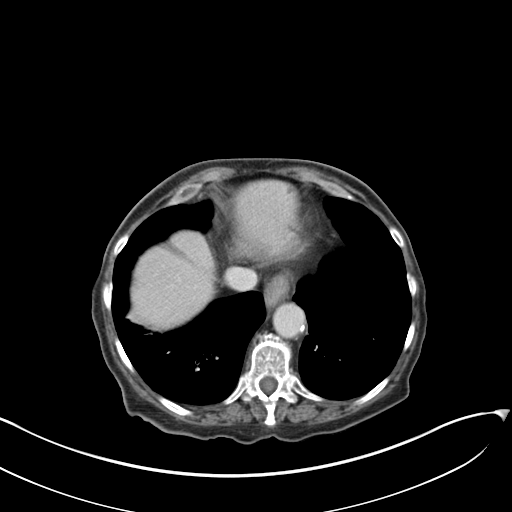

[Series 5: coronal st · coronal · 0.72mm/px · 3 of 118 slices shown]
[im 40/118  soft-tissue]
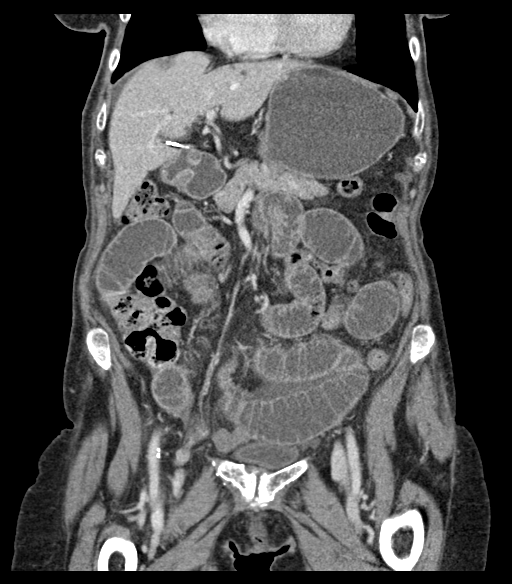
[im 53/118  soft-tissue]
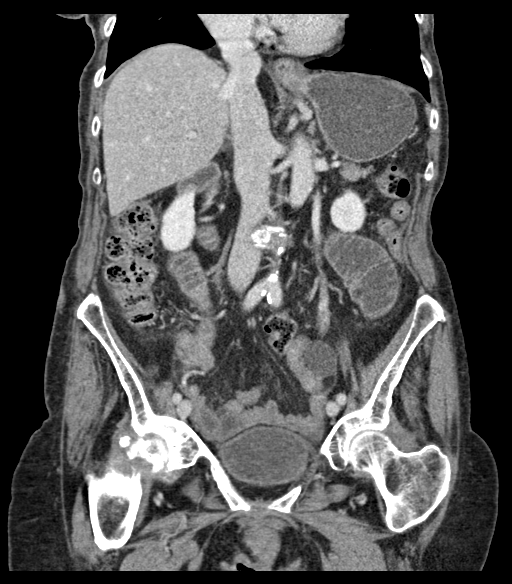
[im 66/118  soft-tissue]
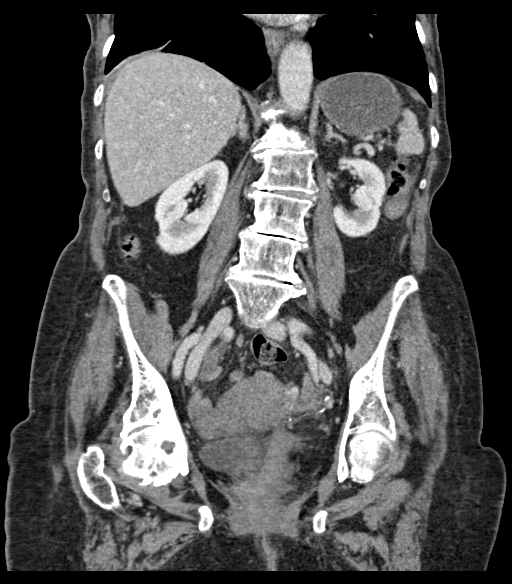

[13 of 46 positions shown; findings below may reference images not displayed]

FINDINGS: Lower chest: Right lower lobe traction bronchiectasis and bronchial
wall thickening. No acute abnormality.

Hepatobiliary: Fluid density lesions within left hepatic lobe likely
represents a simple hepatic cysts. Subcentimeter hypodensities are
too small to characterize. Status post cholecystectomy. No biliary
dilatation.

Pancreas: No focal lesion. Normal pancreatic contour. No surrounding
inflammatory changes. No main pancreatic ductal dilatation.

Spleen: Normal in size without focal abnormality.

Adrenals/Urinary Tract:

No adrenal nodule bilaterally.

Bilateral kidneys enhance symmetrically. Subcentimeter hypodensities
are too small to characterize.

No hydronephrosis. No hydroureter.

The urinary bladder is unremarkable.

Stomach/Bowel: Stomach is within normal limits. Fluid dilatation of
the proximal to mid small bowel with a transition point at the
entrance site of a right inguinal hernia. The small bowel distal to
this is decompressed. Short segment of small bowel is noted
distended with fluid within the hernia sac. Small bowel within the
mid abdomen measures up to 4.2 cm. No pneumatosis. No small bowel
thickening. No evidence of large bowel wall thickening or
dilatation. Appendix appears normal.

Vascular/Lymphatic: No abdominal aorta or iliac aneurysm. Mild
atherosclerotic plaque of the aorta and its branches. No abdominal,
pelvic, or inguinal lymphadenopathy.

Reproductive: Uterus and bilateral adnexa are unremarkable.

Other: Trace perisplenic free fluid. No intraperitoneal free gas.
No organized fluid collection.

Musculoskeletal:

No abdominal wall hernia or abnormality.

No suspicious lytic or blastic osseous lesions. No acute displaced
fracture. Multilevel severe degenerative changes of the spine. Right
hip severe degenerative changes. Pubic symphysis degenerative
changes.
IMPRESSION: 1. High-grade small-bowel obstruction of the proximal to mid small
bowel with a transition point at the entrance site of a right
inguinal hernia. Fluid distension of a short loop of small bowel
noted within the right inguinal hernia. A developing closed loop
obstruction of the short segment of small bowel contained within the
right inguinal hernia is not excluded. No findings of ischemia are
noted.
2. Severe degenerative changes of the right hip.

ADDENDUM:
These results were called by telephone at the time of interpretation
on 01/28/2021 at [DATE] to provider CHELY STEPHAN , who verbally
acknowledged these results.

*** End of Addendum ***
FINDINGS: Lower chest: Right lower lobe traction bronchiectasis and bronchial
wall thickening. No acute abnormality.

Hepatobiliary: Fluid density lesions within left hepatic lobe likely
represents a simple hepatic cysts. Subcentimeter hypodensities are
too small to characterize. Status post cholecystectomy. No biliary
dilatation.

Pancreas: No focal lesion. Normal pancreatic contour. No surrounding
inflammatory changes. No main pancreatic ductal dilatation.

Spleen: Normal in size without focal abnormality.

Adrenals/Urinary Tract:

No adrenal nodule bilaterally.

Bilateral kidneys enhance symmetrically. Subcentimeter hypodensities
are too small to characterize.

No hydronephrosis. No hydroureter.

The urinary bladder is unremarkable.

Stomach/Bowel: Stomach is within normal limits. Fluid dilatation of
the proximal to mid small bowel with a transition point at the
entrance site of a right inguinal hernia. The small bowel distal to
this is decompressed. Short segment of small bowel is noted
distended with fluid within the hernia sac. Small bowel within the
mid abdomen measures up to 4.2 cm. No pneumatosis. No small bowel
thickening. No evidence of large bowel wall thickening or
dilatation. Appendix appears normal.

Vascular/Lymphatic: No abdominal aorta or iliac aneurysm. Mild
atherosclerotic plaque of the aorta and its branches. No abdominal,
pelvic, or inguinal lymphadenopathy.

Reproductive: Uterus and bilateral adnexa are unremarkable.

Other: Trace perisplenic free fluid. No intraperitoneal free gas.
No organized fluid collection.

Musculoskeletal:

No abdominal wall hernia or abnormality.

No suspicious lytic or blastic osseous lesions. No acute displaced
fracture. Multilevel severe degenerative changes of the spine. Right
hip severe degenerative changes. Pubic symphysis degenerative
changes.
IMPRESSION: 1. High-grade small-bowel obstruction of the proximal to mid small
bowel with a transition point at the entrance site of a right
inguinal hernia. Fluid distension of a short loop of small bowel
noted within the right inguinal hernia. A developing closed loop
obstruction of the short segment of small bowel contained within the
right inguinal hernia is not excluded. No findings of ischemia are
noted.
2. Severe degenerative changes of the right hip.

## 2022-07-26 IMAGING — DX DG CHEST 2V
2 series · 2 of 2 positions shown · non-contrast
Comparison: 01/23/2013

CLINICAL DATA: Cough

EXAM:
CHEST - 2 VIEW

[chest lat]
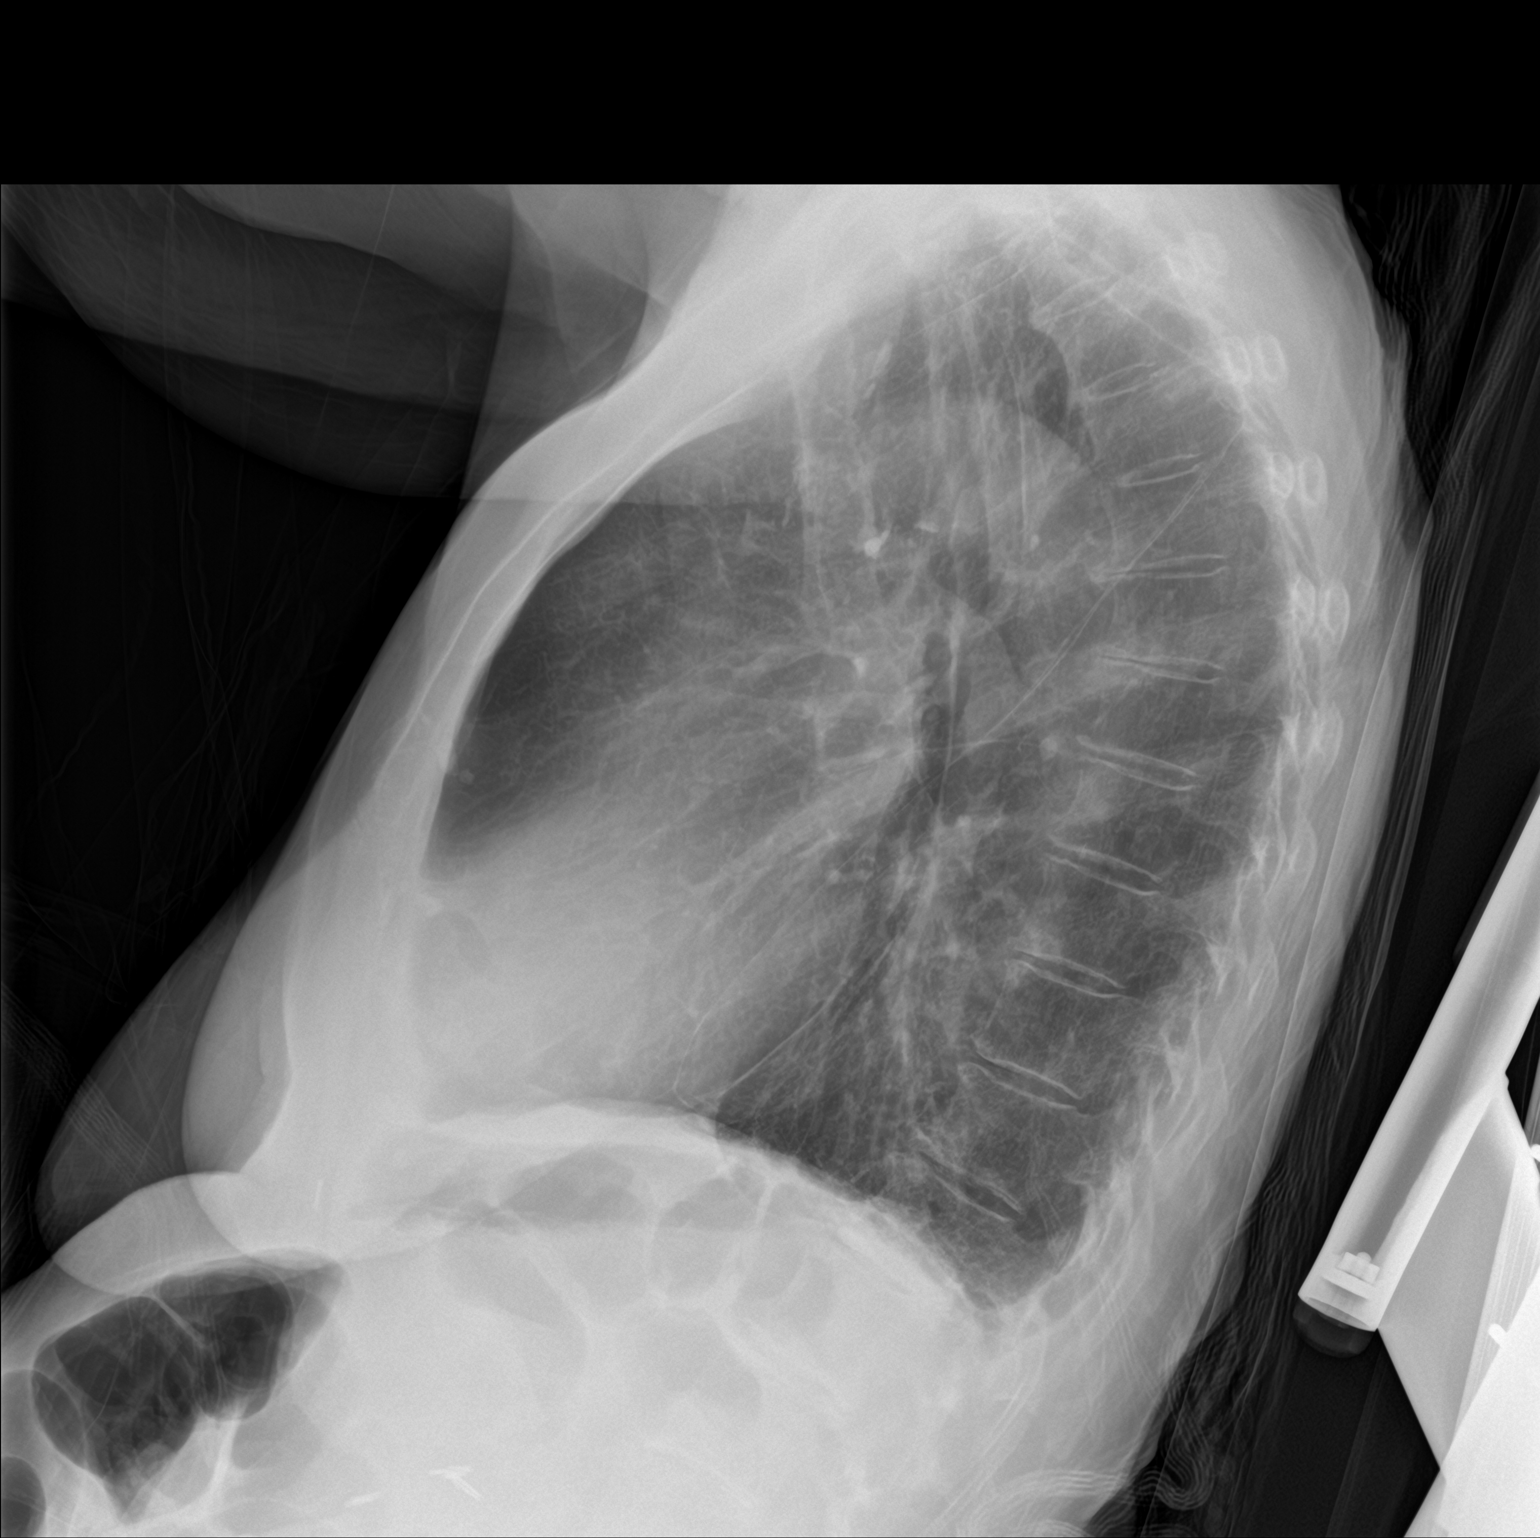

[chest ap]
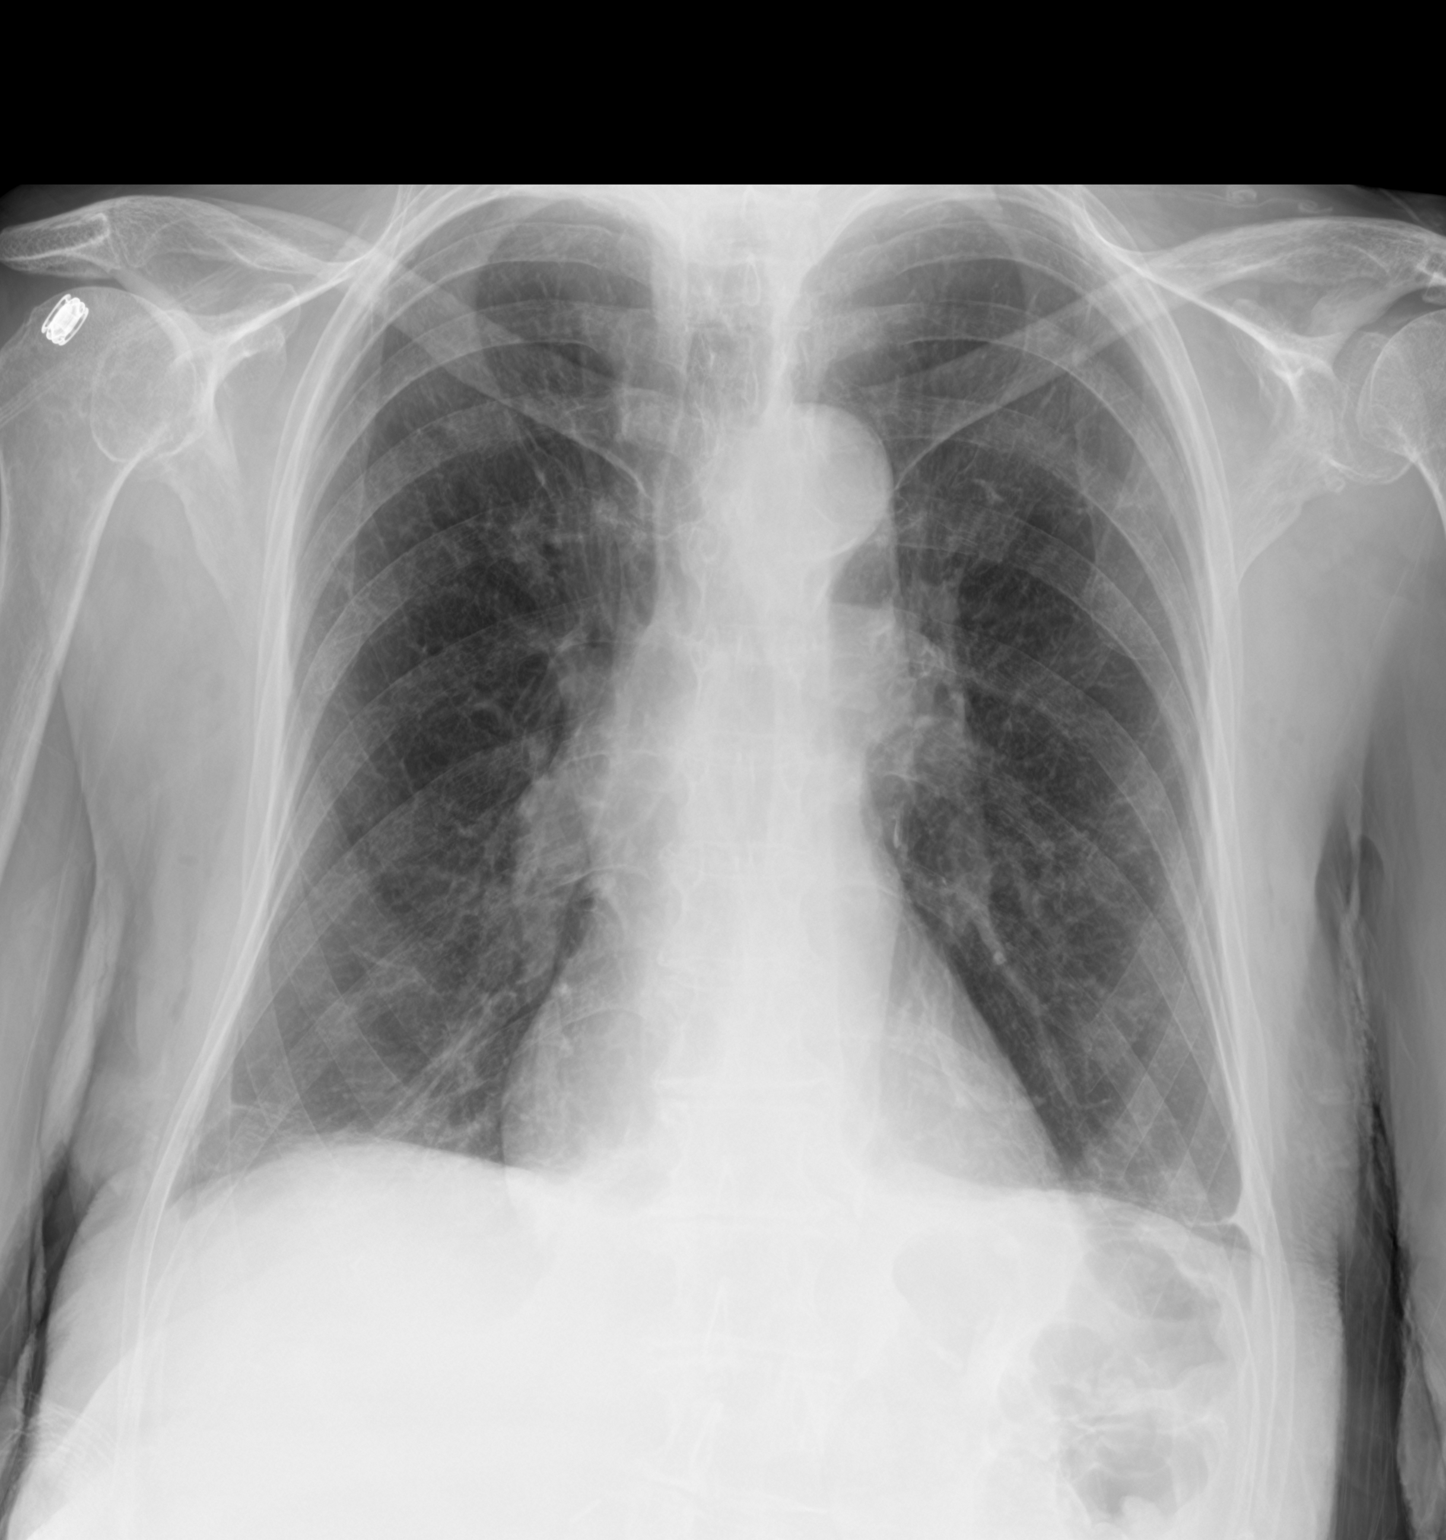

[2 of 2 positions shown; findings below may reference images not displayed]

FINDINGS: The heart size and mediastinal contours are within normal limits.
Both lungs are clear. Disc degenerative disease of the thoracic
spine.
IMPRESSION: No acute abnormality of the lungs.

## 2022-09-23 ENCOUNTER — Ambulatory Visit: Payer: Medicare Other | Admitting: Internal Medicine

## 2022-09-23 ENCOUNTER — Encounter: Payer: Self-pay | Admitting: Internal Medicine

## 2022-09-23 VITALS — BP 120/78 | HR 66 | Temp 97.9°F | Resp 18 | Ht 60.0 in | Wt 118.4 lb

## 2022-09-23 DIAGNOSIS — I10 Essential (primary) hypertension: Secondary | ICD-10-CM | POA: Diagnosis not present

## 2022-09-23 DIAGNOSIS — Z6823 Body mass index (BMI) 23.0-23.9, adult: Secondary | ICD-10-CM | POA: Diagnosis not present

## 2022-09-23 DIAGNOSIS — I5032 Chronic diastolic (congestive) heart failure: Secondary | ICD-10-CM

## 2022-09-23 DIAGNOSIS — E039 Hypothyroidism, unspecified: Secondary | ICD-10-CM

## 2022-09-23 NOTE — Progress Notes (Signed)
Office Visit  Subjective   Patient ID: Rebekah Bush   DOB: [birth year]   Age: 89 y.o.   MRN: [id number]   Chief Complaint Chief Complaint  Patient presents with   New Patient (Initial Visit)    New Patient to establish PCP     History of Present Illness Rebekah Bush is a 89 yo female who comes in today to establish care.  She has moved in to [facility] about 1 month ago where she was at another independent living here in [place] 3 months before that.  Her previous PCP was Kelli Hope, NP with her last visit 3 months ago and they did an annual wellness exam at that time per the patient.  The patient does have a history of hypertension.  She was diagnosed about 40 years ago with hypertension.  Since her last visit, she states she has not had any problems.  She does check her blood pressure from home and she states her systolic BP runs in the 120's.  She had an episode of presyncope where she was hospitalized in 05/2022.  They stated that given her advanced age and very favorable TTE,that they did not recommend strict blood pressure control.  She is currently on hydralazine 50mg  BID and Toprol XL 100mg  daily.  She will have occasional LE edema where she will take lasix 20mg  as needed.  She denies any intolerance to her BP meds.  She denies any lightheadedness, dizziness, blurred vision, double vision, chest pain, palpitations, SOB, generalized weakness or edema.  She also was noted to have chronic diastolic congestive heart failure.  They noted during her hospitalization that she appeared volume depleted and gave her IVF's.  They performed an ECHO in 05/2022 and this demonstrated a normal LVEF 60-65% without wall motion abnormalities and only grade 1 DD w/ no significant valvular abnormalities.  She was having some DOE at that time but they felt this was simply age related deconditioning.  Again, she uses lasix for peripheral edema prn which usually twice a week.  She  admits she has been walking more and this does help with her DOE.  The patient also has a history of hypothyroidism which she states she has been taking medications for at least 20 years.  She states they discovered it on routine blood testing.  The patient is currently on levothyroxine daily.  Today, she denies any heat/cold intolerance, unexplained weight changes, tremors, anxiety, insomnia, dry skin, diarrhea, constipation or other problems.     Past Medical History Past Medical History:  Diagnosis Date   Basal cell carcinoma 08/29/2019   sclerosis on right temple   Chronic diastolic (congestive) heart failure (HCC)    Closed dislocation of right shoulder 05/07/2019   Closed fracture of superior ramus of pubis, initial encounter (HCC) 05/07/2019   Gout    Hypertension    Hypothyroidism    Retinal detachment    OD [facility]   Shoulder dislocation, right, initial encounter 05/08/2019   Squamous cell carcinoma of skin 08/29/2019   in situ on left buccal cheek   Squamous cell carcinoma of skin 08/29/2019   in situ on left upper arm, posterior   Squamous cell carcinoma of skin 08/29/2019   in situ on left forearm, posterior     Allergies Allergies  Allergen Reactions   Bactrim [Sulfamethoxazole-Trimethoprim] Shortness Of Breath, Nausea And Vomiting and Other (See Comments)    Headaches   Sulfa Antibiotics Shortness Of Breath, Nausea And  Vomiting and Other (See Comments)    Headaches      Medications  Current Outpatient Medications:    hydrALAZINE (APRESOLINE) 50 MG tablet, Take 50 mg by mouth in the morning and at bedtime., Disp: , Rfl:    ibuprofen (ADVIL) 200 MG tablet, Take 200 mg by mouth daily as needed for moderate pain., Disp: , Rfl:    levothyroxine (SYNTHROID, LEVOTHROID) 75 MCG tablet, Take 75 mcg by mouth daily., Disp: , Rfl:    magnesium oxide (MAG-OX) 400 MG tablet, Take 400 mg by mouth daily., Disp: , Rfl:    metoprolol succinate (TOPROL-XL) 100  MG 24 hr tablet, Take 100 mg by mouth See admin instructions. 100 mg once daily in the evening. HOLD for low blood pressure., Disp: , Rfl:    Review of Systems Review of Systems  Constitutional:  Negative for chills and fever.  Eyes:  Negative for blurred vision and double vision.  Respiratory:  Negative for cough and shortness of breath.   Cardiovascular:  Negative for chest pain, palpitations and leg swelling.  Gastrointestinal:  Negative for abdominal pain, constipation, diarrhea, nausea and vomiting.  Musculoskeletal:  Negative for myalgias.  Neurological:  Negative for dizziness, weakness and headaches.  Psychiatric/Behavioral:  Negative for depression. The patient is not nervous/anxious.        Objective:    Vitals BP 120/78 (BP Location: Left Arm, Patient Position: Sitting, Cuff Size: Normal)   Pulse 66   Temp 97.9 F (36.6 C)   Resp 18   Ht 5' (1.524 m)   Wt 118 lb 6 oz (53.7 kg)   SpO2 95%   BMI 23.12 kg/m    Physical Examination Physical Exam Constitutional:      Appearance: Normal appearance. She is not ill-appearing.  Cardiovascular:     Rate and Rhythm: Normal rate and regular rhythm.     Pulses: Normal pulses.     Heart sounds: No murmur heard.    No friction rub. No gallop.  Pulmonary:     Effort: Pulmonary effort is normal. No respiratory distress.     Breath sounds: No wheezing, rhonchi or rales.  Abdominal:     General: Bowel sounds are normal. There is no distension.     Palpations: Abdomen is soft.     Tenderness: There is no abdominal tenderness.  Musculoskeletal:        General: No swelling.     Right lower leg: Edema present.     Left lower leg: Edema present.     Comments: 1+ edema in feet  Skin:    General: Skin is warm and dry.     Findings: No rash.  Neurological:     General: No focal deficit present.     Mental Status: She is alert and oriented to person, place, and time.  Psychiatric:        Mood and Affect: Mood normal.         Behavior: Behavior normal.        Assessment & Plan:   Essential hypertension Her BP is doing well.  She will continue on her current meds.  She has not had any other episodes of presyncope.  Acquired hypothyroidism We will check her TFT's in 3 months.  She seems euthyhroid today.  Chronic diastolic CHF (congestive heart failure) (HCC) I want her to continue to use her lasix as needed.  She has some minimal swelling today.    Return in about 3 months (around 12/24/2022).  Crist Fat, MD

## 2022-09-23 NOTE — Assessment & Plan Note (Signed)
I want her to continue to use her lasix as needed.  She has some minimal swelling today.

## 2022-09-23 NOTE — Assessment & Plan Note (Signed)
We will check her TFT's in 3 months.  She seems euthyhroid today.

## 2022-09-23 NOTE — Assessment & Plan Note (Signed)
Her BP is doing well.  She will continue on her current meds.  She has not had any other episodes of presyncope.

## 2022-10-25 ENCOUNTER — Other Ambulatory Visit: Payer: Self-pay

## 2022-10-25 MED ORDER — LEVOTHYROXINE SODIUM 75 MCG PO TABS: 75.0000 ug | ORAL_TABLET | Freq: Every day | ORAL | 0 refills | Status: DC

## 2022-11-09 ENCOUNTER — Encounter (HOSPITAL_BASED_OUTPATIENT_CLINIC_OR_DEPARTMENT_OTHER): Payer: Self-pay | Admitting: Emergency Medicine

## 2022-11-09 ENCOUNTER — Emergency Department (HOSPITAL_BASED_OUTPATIENT_CLINIC_OR_DEPARTMENT_OTHER)
Admission: EM | Admit: 2022-11-09 | Discharge: 2022-11-09 | Disposition: A | Discharge status: Home / Self Care | Payer: Medicare Other | Attending: Emergency Medicine | Admitting: Emergency Medicine

## 2022-11-09 ENCOUNTER — Emergency Department (HOSPITAL_BASED_OUTPATIENT_CLINIC_OR_DEPARTMENT_OTHER): Payer: Medicare Other

## 2022-11-09 ENCOUNTER — Other Ambulatory Visit: Payer: Self-pay

## 2022-11-09 DIAGNOSIS — Z79899 Other long term (current) drug therapy: Secondary | ICD-10-CM | POA: Diagnosis not present

## 2022-11-09 DIAGNOSIS — I160 Hypertensive urgency: Secondary | ICD-10-CM | POA: Insufficient documentation

## 2022-11-09 DIAGNOSIS — I509 Heart failure, unspecified: Secondary | ICD-10-CM | POA: Diagnosis not present

## 2022-11-09 DIAGNOSIS — R6884 Jaw pain: Secondary | ICD-10-CM | POA: Insufficient documentation

## 2022-11-09 DIAGNOSIS — R0789 Other chest pain: Secondary | ICD-10-CM | POA: Diagnosis present

## 2022-11-09 DIAGNOSIS — I110 Hypertensive heart disease with heart failure: Secondary | ICD-10-CM | POA: Diagnosis not present

## 2022-11-09 LAB — TROPONIN I (HIGH SENSITIVITY)
Troponin I (High Sensitivity): 7 ng/L (ref <18)
Troponin I (High Sensitivity): 12 ng/L (ref <18)

## 2022-11-09 LAB — CBC
HCT: 46.9 % — ABNORMAL HIGH (ref 36.0–46.0)
Hemoglobin: 15.5 g/dL — ABNORMAL HIGH (ref 12.0–15.0)
MCH: 29.5 pg (ref 26.0–34.0)
MCHC: 33 g/dL (ref 30.0–36.0)
MCV: 89.3 fL (ref 80.0–100.0)
Platelets: 276 10*3/uL (ref 150–400)
RBC: 5.25 MIL/uL — ABNORMAL HIGH (ref 3.87–5.11)
RDW: 15.2 % (ref 11.5–15.5)
WBC: 9.2 10*3/uL (ref 4.0–10.5)
nRBC: 0 % (ref 0.0–0.2)

## 2022-11-09 LAB — BASIC METABOLIC PANEL
Anion gap: 13 (ref 5–15)
BUN: 26 mg/dL — ABNORMAL HIGH (ref 8–23)
CO2: 27 mmol/L (ref 22–32)
Calcium: 10.1 mg/dL (ref 8.9–10.3)
Chloride: 101 mmol/L (ref 98–111)
Creatinine, Ser: 0.89 mg/dL (ref 0.44–1.00)
GFR, Estimated: >60 mL/min (ref >60)
Glucose, Bld: 101 mg/dL — ABNORMAL HIGH (ref 70–99)
Potassium: 3.3 mmol/L — ABNORMAL LOW (ref 3.5–5.1)
Sodium: 141 mmol/L (ref 135–145)

## 2022-11-09 MED ORDER — HYDRALAZINE HCL 20 MG/ML IJ SOLN
5.0000 mg | Freq: Once | INTRAMUSCULAR | Status: AC
  Administered 2022-11-09: 5 mg via INTRAVENOUS
  Filled 2022-11-09: qty 1

## 2022-11-09 MED ORDER — LABETALOL HCL 5 MG/ML IV SOLN: 5.0000 mg | Freq: Once | INTRAVENOUS | Status: DC

## 2022-11-09 MED ORDER — HYDRALAZINE HCL 25 MG PO TABS
50.0000 mg | ORAL_TABLET | Freq: Once | ORAL | Status: AC
  Administered 2022-11-09: 50 mg via ORAL
  Filled 2022-11-09: qty 2

## 2022-11-09 MED ORDER — METOPROLOL SUCCINATE ER 25 MG PO TB24
100.0000 mg | ORAL_TABLET | Freq: Every day | ORAL | Status: DC
  Administered 2022-11-09: 100 mg via ORAL
  Filled 2022-11-09: qty 4

## 2022-11-09 NOTE — Discharge Instructions (Addendum)
Decrease salt intake. Please follow up with our primary care physician for repeat blood pressure monitoring and further management of hypertension.

## 2022-11-09 NOTE — ED Notes (Signed)
Reviewed AVS/discharge instruction with patient. Time allotted for and all questions answered. Patient is agreeable for d/c and escorted to ed exit by staff.  

## 2022-11-09 NOTE — ED Provider Notes (Signed)
[place] EMERGENCY DEPARTMENT AT [facility] Provider Note   CSN: [id number] Arrival date & time: 11/09/22  1627     History {Add pertinent medical, surgical, social history, OB history to HPI:1} Chief Complaint  Patient presents with   Chest Pain    Rebekah Bush is a 89 y.o. female.  Patient is a 89 yo female with pmh of hypertension, hyperlipidemia, and CHF presenting for chest pain. Pt states several hours prior to arrival she started having right sided jaw ache and chest discomfort that lasted approx 10 minutes then completely resolved. Denies sob. Denies fever, chills, or coughing.   The history is provided by the patient. No language interpreter was used.  Chest Pain      Home Medications Prior to Admission medications   Medication Sig Start Date End Date Taking? Authorizing Provider  hydrALAZINE (APRESOLINE) 50 MG tablet Take 50 mg by mouth in the morning and at bedtime. 03/29/19   [provider]  ibuprofen (ADVIL) 200 MG tablet Take 200 mg by mouth daily as needed for moderate pain.    [provider]  levothyroxine (SYNTHROID) 75 MCG tablet Take 1 tablet (75 mcg total) by mouth daily. 10/25/22 11/24/22  Eloisa Northern, MD  magnesium oxide (MAG-OX) 400 MG tablet Take 400 mg by mouth daily.    [provider]  metoprolol succinate (TOPROL-XL) 100 MG 24 hr tablet Take 100 mg by mouth See admin instructions. 100 mg once daily in the evening. HOLD for low blood pressure.    [provider]      Allergies    Bactrim [sulfamethoxazole-trimethoprim] and Sulfa antibiotics    Review of Systems   Review of Systems  Cardiovascular:  Positive for chest pain.    Physical Exam Updated Vital Signs BP (!) 191/92   Pulse 93   Temp (!) 97.5 F (36.4 C)   Resp 17   Ht 5' (1.524 m)   Wt 53.1 kg   SpO2 97%   BMI 22.85 kg/m  Physical Exam  ED Results / Procedures / Treatments   Labs (all labs ordered are listed, but only abnormal  results are displayed) Labs Reviewed  BASIC METABOLIC PANEL - Abnormal; Notable for the following components:      Result Value   Potassium 3.3 (*)    Glucose, Bld 101 (*)    BUN 26 (*)    All other components within normal limits  CBC - Abnormal; Notable for the following components:   RBC 5.25 (*)    Hemoglobin 15.5 (*)    HCT 46.9 (*)    All other components within normal limits  TROPONIN I (HIGH SENSITIVITY)    EKG EKG Interpretation Date/Time:  Tuesday November 09 2022 16:43:35 EDT Ventricular Rate:  91 PR Interval:  147 QRS Duration:  93 QT Interval:  359 QTC Calculation: 442 R Axis:   74  Text Interpretation: Sinus rhythm Repol abnrm suggests ischemia, anterolateral Minimal ST elevation, lateral leads Confirmed by Edwin Dada ([id number]) on 11/09/2022 5:32:35 PM  Radiology No results found.  Procedures Procedures  {Document cardiac monitor, telemetry assessment procedure when appropriate:1}  Medications Ordered in ED Medications - No data to display  ED Course/ Medical Decision Making/ A&P   {   Click here for ABCD2, HEART and other calculatorsREFRESH Note before signing :1}                          Medical Decision Making Amount  and/or Complexity of Data Reviewed Labs: ordered. Radiology: ordered.   ***  {Document critical care time when appropriate:1} {Document review of labs and clinical decision tools ie heart score, Chads2Vasc2 etc:1}  {Document your independent review of radiology images, and any outside records:1} {Document your discussion with family members, caretakers, and with consultants:1} {Document social determinants of health affecting pt's care:1} {Document your decision making why or why not admission, treatments were needed:1} Final Clinical Impression(s) / ED Diagnoses Final diagnoses:  None    Rx / DC Orders ED Discharge Orders     None

## 2022-11-09 NOTE — ED Triage Notes (Addendum)
Walker to room 7. NAD. A+Ox4. Family at bedside.  Reports BP of 213/111 at rest. Right jaw pain. -SOB. +Chest tightness. Swelling noticed around ankles.  Took hydralazine and lasix this AM. Metoprolol at night.

## 2022-11-11 ENCOUNTER — Encounter: Payer: Self-pay | Admitting: Internal Medicine

## 2022-11-11 ENCOUNTER — Ambulatory Visit: Payer: Medicare Other | Admitting: Internal Medicine

## 2022-11-11 VITALS — BP 116/78 | HR 68 | Temp 97.5°F | Resp 18 | Ht 60.0 in | Wt 118.0 lb

## 2022-11-11 DIAGNOSIS — I10 Essential (primary) hypertension: Secondary | ICD-10-CM

## 2022-11-11 NOTE — Assessment & Plan Note (Signed)
She has not had any further chest pain.  She is checking her BP 3 times a day and I told her to check it once a day maybe 2-3 hours after taking her BP meds.  She admits she is eating high sodium diet which she was not doing before she moved to [facility].  I want her to go back on a low sodium diet.  We will not change any of her meds at this time.

## 2022-11-11 NOTE — Progress Notes (Signed)
Office Visit  Subjective   Patient ID: Rebekah Bush   DOB: 04/14/1930   Age: 89 y.o.   MRN: [id number]   Chief Complaint Chief Complaint  Patient presents with   Follow-up    ER FOLLOW UP     History of Present Illness The patient is a 89 yo female who comes in today for an ER followup where she was seen at [facility] ER on 11/09/2022 for complaints of chest pain.  She states it started as right sided jaw pain but began having chest tightness.  The patient was noted to have a BP of 210/88 and otherwise stable vitals. She is normally on hydralazine in the morning and repeat hydralazine and metoprolol before bed.  They gave these medications in the ER.  She had a normal NSR EKG and negative troponin x 2.  They had concerns for hypertensive urgency. Patient had high sodium meal last night of lunch meat. They did not feel she was having a MI or other emergency and she was sent home from the ER without any new medications.  The patient does have a history of hypertension.  She was diagnosed about 40 years ago with hypertension.  Since her last visit, she states she has not had any problems.  She does check her blood pressure at home intermittently but since the ER visit on 11/09/2022, it has been down and stable.  She states her systolic BP runs in the 120-150's.  She had an episode of presyncope where she was hospitalized in 05/2022.  They stated that given her advanced age and very favorable TTE,that they did not recommend strict blood pressure control.  She is currently on hydralazine 50mg  BID and Toprol XL 100mg  daily.  She will have occasional LE edema where she will take lasix 20mg  as needed.  She denies any intolerance to her BP meds.  She denies any lightheadedness, dizziness, blurred vision, double vision, chest pain, palpitations, SOB, generalized weakness or edema.     Past Medical History Past Medical History:  Diagnosis Date   Basal cell carcinoma 08/29/2019   sclerosis on right temple    Chronic diastolic (congestive) heart failure (HCC)    Closed dislocation of right shoulder 05/07/2019   Closed fracture of superior ramus of pubis, initial encounter (HCC) 05/07/2019   Gout    Hypertension    Hypothyroidism    Retinal detachment    OD [facility]   Shoulder dislocation, right, initial encounter 05/08/2019   Squamous cell carcinoma of skin 08/29/2019   in situ on left buccal cheek   Squamous cell carcinoma of skin 08/29/2019   in situ on left upper arm, posterior   Squamous cell carcinoma of skin 08/29/2019   in situ on left forearm, posterior     Allergies Allergies  Allergen Reactions   Bactrim [Sulfamethoxazole-Trimethoprim] Shortness Of Breath, Nausea And Vomiting and Other (See Comments)    Headaches   Sulfa Antibiotics Shortness Of Breath, Nausea And Vomiting and Other (See Comments)    Headaches      Medications  Current Outpatient Medications:    hydrALAZINE (APRESOLINE) 50 MG tablet, Take 50 mg by mouth in the morning and at bedtime., Disp: , Rfl:    ibuprofen (ADVIL) 200 MG tablet, Take 200 mg by mouth daily as needed for moderate pain., Disp: , Rfl:    levothyroxine (SYNTHROID) 75 MCG tablet, Take 1 tablet (75 mcg total) by mouth daily., Disp: 30 tablet, Rfl: 0  magnesium oxide (MAG-OX) 400 MG tablet, Take 400 mg by mouth daily., Disp: , Rfl:    metoprolol succinate (TOPROL-XL) 100 MG 24 hr tablet, Take 100 mg by mouth See admin instructions. 100 mg once daily in the evening. HOLD for low blood pressure., Disp: , Rfl:    Review of Systems Review of Systems  Constitutional:  Negative for chills and fever.  Eyes:  Negative for blurred vision and double vision.  Respiratory:  Negative for cough and shortness of breath.   Cardiovascular:  Negative for chest pain, palpitations and leg swelling.  Gastrointestinal:  Negative for abdominal pain, nausea and vomiting.  Musculoskeletal:  Negative for myalgias.  Neurological:  Negative for  dizziness, weakness and headaches.       Objective:    Vitals BP 116/78 (BP Location: Left Arm, Patient Position: Sitting, Cuff Size: Normal)   Pulse 68   Temp (!) 97.5 F (36.4 C)   Resp 18   Ht 5' (1.524 m)   Wt 118 lb (53.5 kg)   SpO2 98%   BMI 23.05 kg/m    Physical Examination Physical Exam Constitutional:      Appearance: Normal appearance. She is not ill-appearing.  Cardiovascular:     Rate and Rhythm: Normal rate and regular rhythm.     Pulses: Normal pulses.     Heart sounds: No murmur heard.    No friction rub. No gallop.  Pulmonary:     Effort: Pulmonary effort is normal. No respiratory distress.     Breath sounds: No wheezing, rhonchi or rales.  Abdominal:     General: Bowel sounds are normal. There is no distension.     Palpations: Abdomen is soft.     Tenderness: There is no abdominal tenderness.  Musculoskeletal:     Right lower leg: No edema.     Left lower leg: No edema.  Skin:    General: Skin is warm and dry.     Findings: No rash.  Neurological:     Mental Status: She is alert.        Assessment & Plan:   Essential hypertension She has not had any further chest pain.  She is checking her BP 3 times a day and I told her to check it once a day maybe 2-3 hours after taking her BP meds.  She admits she is eating high sodium diet which she was not doing before she moved to [facility].  I want her to go back on a low sodium diet.  We will not change any of her meds at this time.    No follow-ups on file.   Crist Fat, MD

## 2022-12-23 ENCOUNTER — Encounter: Payer: Self-pay | Admitting: Internal Medicine

## 2022-12-23 ENCOUNTER — Ambulatory Visit: Payer: Medicare Other | Admitting: Internal Medicine

## 2022-12-23 VITALS — BP 122/78 | HR 59 | Temp 97.9°F | Resp 18 | Ht 60.0 in | Wt 114.2 lb

## 2022-12-23 DIAGNOSIS — E039 Hypothyroidism, unspecified: Secondary | ICD-10-CM

## 2022-12-23 DIAGNOSIS — I10 Essential (primary) hypertension: Secondary | ICD-10-CM

## 2022-12-23 NOTE — Assessment & Plan Note (Signed)
She seems euthyroid today.  WE will check her TFT's today.

## 2022-12-23 NOTE — Progress Notes (Signed)
Office Visit  Subjective   Patient ID: Rebekah Bush   DOB: 27-Jul-1929   Age: 89 y.o.   MRN: [id number]   Chief Complaint Chief Complaint  Patient presents with   Follow-up    Essential hypertension     History of Present Illness The patient also has a history of hypothyroidism which she states she has been taking medications for at least 20 years.  She states they discovered it on routine blood testing.  Since her last visit, she denies any problems with her hypothyroidism.  The patient is currently on levothyroxine daily.  Today, she denies any heat/cold intolerance, unexplained weight changes, tremors, anxiety, insomnia, dry skin, diarrhea, constipation or other problems.  The patient returns today for followup of her hypertension.  She was diagnosed about 40 years ago with hypertension.  Since her last visit, she states she has not had any problems.  She does check her blood pressure at home intermittently.  She states her systolic BP runs in the 120-150's.  She had an episode of presyncope where she was hospitalized in 05/2022.  They stated that given her advanced age and very favorable TTE,that they did not recommend strict blood pressure control.  She is currently on hydralazine 50mg  BID and Toprol XL 100mg  daily.  She will have occasional LE edema where she will take lasix 20mg  as needed.  She denies any intolerance to her BP meds.  She denies any lightheadedness, dizziness, blurred vision, double vision, chest pain, palpitations, SOB, generalized weakness or edema.     Past Medical History Past Medical History:  Diagnosis Date   Basal cell carcinoma 08/29/2019   sclerosis on right temple   Chronic diastolic (congestive) heart failure (HCC)    Closed dislocation of right shoulder 05/07/2019   Closed fracture of superior ramus of pubis, initial encounter (HCC) 05/07/2019   Gout    Hypertension    Hypothyroidism    Retinal detachment    OD [facility]   Shoulder  dislocation, right, initial encounter 05/08/2019   Squamous cell carcinoma of skin 08/29/2019   in situ on left buccal cheek   Squamous cell carcinoma of skin 08/29/2019   in situ on left upper arm, posterior   Squamous cell carcinoma of skin 08/29/2019   in situ on left forearm, posterior     Allergies Allergies  Allergen Reactions   Bactrim [Sulfamethoxazole-Trimethoprim] Shortness Of Breath, Nausea And Vomiting and Other (See Comments)    Headaches   Sulfa Antibiotics Shortness Of Breath, Nausea And Vomiting and Other (See Comments)    Headaches      Medications  Current Outpatient Medications:    hydrALAZINE (APRESOLINE) 50 MG tablet, Take 50 mg by mouth in the morning and at bedtime., Disp: , Rfl:    ibuprofen (ADVIL) 200 MG tablet, Take 200 mg by mouth daily as needed for moderate pain., Disp: , Rfl:    levothyroxine (SYNTHROID) 75 MCG tablet, Take 1 tablet (75 mcg total) by mouth daily., Disp: 30 tablet, Rfl: 0   magnesium oxide (MAG-OX) 400 MG tablet, Take 400 mg by mouth daily., Disp: , Rfl:    metoprolol succinate (TOPROL-XL) 100 MG 24 hr tablet, Take 100 mg by mouth See admin instructions. 100 mg once daily in the evening. HOLD for low blood pressure., Disp: , Rfl:    Review of Systems Review of Systems  Constitutional:  Negative for chills, fever and malaise/fatigue.  Eyes:  Negative for blurred vision and double vision.  Respiratory:  Negative for shortness of breath.   Cardiovascular:  Negative for chest pain, palpitations and leg swelling.  Gastrointestinal:  Negative for abdominal pain, constipation, diarrhea, nausea and vomiting.  Musculoskeletal:  Negative for myalgias.  Skin:  Negative for itching and rash.  Neurological:  Negative for dizziness, weakness and headaches.  Psychiatric/Behavioral:  The patient is not nervous/anxious.        Objective:    Vitals BP 122/78 (BP Location: Left Arm, Patient Position: Sitting, Cuff Size: Normal)   Pulse (!) 59    Temp 97.9 F (36.6 C)   Resp 18   Ht 5' (1.524 m)   Wt 114 lb 4 oz (51.8 kg)   SpO2 99%   BMI 22.31 kg/m    Physical Examination Physical Exam Constitutional:      Appearance: Normal appearance. She is not ill-appearing.  Cardiovascular:     Rate and Rhythm: Normal rate and regular rhythm.     Pulses: Normal pulses.     Heart sounds: No murmur heard.    No friction rub. No gallop.  Pulmonary:     Effort: Pulmonary effort is normal. No respiratory distress.     Breath sounds: No wheezing, rhonchi or rales.  Abdominal:     General: Bowel sounds are normal. There is no distension.     Palpations: Abdomen is soft.     Tenderness: There is no abdominal tenderness.  Musculoskeletal:     Right lower leg: No edema.     Left lower leg: No edema.  Skin:    General: Skin is warm and dry.     Findings: No rash.  Neurological:     Mental Status: She is alert.        Assessment & Plan:   Essential hypertension Her BP is doing well.  We will continue on her current meds.  We are not trying to do strict BP control due to her history of presyncope.  Acquired hypothyroidism She seems euthyroid today.  WE will check her TFT's today.    Return in about 3 months (around 03/24/2023).   Crist Fat, MD

## 2022-12-23 NOTE — Assessment & Plan Note (Signed)
Her BP is doing well.  We will continue on her current meds.  We are not trying to do strict BP control due to her history of presyncope.

## 2022-12-28 ENCOUNTER — Ambulatory Visit: Payer: Medicare Other | Admitting: Internal Medicine

## 2022-12-28 ENCOUNTER — Other Ambulatory Visit: Payer: Self-pay | Admitting: Internal Medicine

## 2022-12-28 DIAGNOSIS — E039 Hypothyroidism, unspecified: Secondary | ICD-10-CM | POA: Diagnosis not present

## 2022-12-28 MED ORDER — METOPROLOL SUCCINATE ER 100 MG PO TB24: 100.0000 mg | ORAL_TABLET | ORAL | 0 refills | Status: DC

## 2022-12-28 NOTE — Addendum Note (Signed)
Addended byRosalie Doctor on: 12/28/2022 01:36 PM   Modules accepted: Orders

## 2022-12-28 NOTE — Progress Notes (Signed)
Nurse visit

## 2022-12-29 LAB — TSH: TSH: 1.83 u[IU]/mL (ref 0.450–4.500)

## 2022-12-29 LAB — T4, FREE: Free T4: 1.84 ng/dL — ABNORMAL HIGH (ref 0.82–1.77)

## 2022-12-30 ENCOUNTER — Other Ambulatory Visit: Payer: Self-pay

## 2022-12-30 ENCOUNTER — Other Ambulatory Visit: Payer: Self-pay | Admitting: Internal Medicine

## 2022-12-30 DIAGNOSIS — E039 Hypothyroidism, unspecified: Secondary | ICD-10-CM

## 2022-12-30 MED ORDER — HYDRALAZINE HCL 50 MG PO TABS: 50.0000 mg | ORAL_TABLET | Freq: Two times a day (BID) | ORAL | 0 refills | Status: DC

## 2022-12-30 MED ORDER — LEVOTHYROXINE SODIUM 50 MCG PO TABS
50.0000 ug | ORAL_TABLET | Freq: Every day | ORAL | 11 refills | Status: DC
Start: 2022-12-30 — End: 2023-06-28

## 2022-12-30 NOTE — Progress Notes (Signed)
Rx change

## 2022-12-30 NOTE — Progress Notes (Signed)
New Rx

## 2022-12-30 NOTE — Progress Notes (Signed)
Her thyroid function is too high. Decrease her levothyroxine to po daily. We will recheck her thyroid function on her next visit.   Called Patient, made aware of lab results. New med sent to [facility]

## 2023-03-01 ENCOUNTER — Emergency Department (HOSPITAL_COMMUNITY)
Admission: EM | Admit: 2023-03-01 | Discharge: 2023-03-01 | Disposition: A | Discharge status: Home / Self Care | Payer: Medicare Other | Attending: Emergency Medicine | Admitting: Emergency Medicine

## 2023-03-01 ENCOUNTER — Encounter (HOSPITAL_COMMUNITY): Payer: Self-pay

## 2023-03-01 DIAGNOSIS — I10 Essential (primary) hypertension: Secondary | ICD-10-CM | POA: Diagnosis present

## 2023-03-01 DIAGNOSIS — Z79899 Other long term (current) drug therapy: Secondary | ICD-10-CM | POA: Insufficient documentation

## 2023-03-01 MED ORDER — LISINOPRIL 10 MG PO TABS
10.0000 mg | ORAL_TABLET | Freq: Once | ORAL | Status: AC
  Administered 2023-03-01: 10 mg via ORAL
  Filled 2023-03-01: qty 1

## 2023-03-01 MED ORDER — LISINOPRIL 5 MG PO TABS: 5.0000 mg | ORAL_TABLET | Freq: Every day | ORAL | 0 refills | Status: DC

## 2023-03-01 NOTE — ED Provider Notes (Signed)
[place] EMERGENCY DEPARTMENT AT [facility] Provider Note   CSN: [id number] Arrival date & time: 03/01/23  2308     History  Chief Complaint  Patient presents with   Hypertension    Rebekah Bush is a 89 y.o. female.  This is a 89 year old female who presents emergency department today due to episode of asymptomatic hypertension.  Patient says that she has been feeling good, has not had any headache or chest pain.  She had her blood pressure checked at her independent living, was 200/100 so they sent her to the emergency department.  Medic blood pressure is 160/90.  Her blood pressure was little bit elevated at 178/96.  Patient was taken off of her lisinopril earlier this year.   Hypertension       Home Medications Prior to Admission medications   Medication Sig Start Date End Date Taking? Authorizing Provider  hydrALAZINE (APRESOLINE) 50 MG tablet Take 1 tablet (50 mg total) by mouth in the morning and at bedtime. 12/30/22   Crist Fat, MD  ibuprofen (ADVIL) 200 MG tablet Take 200 mg by mouth daily as needed for moderate pain.    [provider]  levothyroxine (SYNTHROID) 50 MCG tablet Take 1 tablet (50 mcg total) by mouth daily. 12/30/22 12/30/23  Crist Fat, MD  magnesium oxide (MAG-OX) 400 MG tablet Take 400 mg by mouth daily.    [provider]  metoprolol succinate (TOPROL-XL) 100 MG 24 hr tablet Take 1 tablet (100 mg total) by mouth See admin instructions. 100 mg once daily in the evening. HOLD for low blood pressure. 12/28/22   Crist Fat, MD      Allergies    Bactrim [sulfamethoxazole-trimethoprim] and Sulfa antibiotics    Review of Systems   Review of Systems  Physical Exam Updated Vital Signs BP (!) 178/96 (BP Location: Left Arm)   Pulse 65   Temp 97.6 F (36.4 C) (Oral)   Resp 18   SpO2 98%  Physical Exam Vitals reviewed.  HENT:     Head: Normocephalic and atraumatic.  Cardiovascular:     Rate and Rhythm:  Normal rate.  Musculoskeletal:        General: Normal range of motion.  Neurological:     General: No focal deficit present.     Mental Status: She is alert.     Cranial Nerves: No cranial nerve deficit.     Motor: No weakness.     Gait: Gait normal.     ED Results / Procedures / Treatments   Labs (all labs ordered are listed, but only abnormal results are displayed) Labs Reviewed - No data to display  EKG None  Radiology No results found.  Procedures Procedures    Medications Ordered in ED Medications  lisinopril (ZESTRIL) tablet 10 mg (10 mg Oral Given 03/01/23 2325)    ED Course/ Medical Decision Making/ A&P                                 Medical Decision Making Risk Prescription drug management.   89 year old female presenting emergency department today due to asymptomatic hypertension.  Plan-as patient has no chest pain, no shortness of breath, no headache, she does not require any imaging, EKG, or blood work.  It appears that she had her lisinopril stopped earlier this year.  She was taking 20 mg/day at that time.  She has no contraindications to  an ACE inhibitor.  Will start the patient on low-dose ACE inhibitor, have her follow-up with her PCP for management of her hypertension.        Final Clinical Impression(s) / ED Diagnoses Final diagnoses:  Primary hypertension    Rx / DC Orders ED Discharge Orders     None         Arletha Pili, DO 03/01/23 2327

## 2023-03-01 NOTE — ED Notes (Signed)
Pt given taxi voucher so she can go back to independent living at [facility]

## 2023-03-01 NOTE — ED Triage Notes (Signed)
Pt is coming independent living, [facility] is the living facility. Nurse reports that bp was 200/100. Medic reports the blood pressure on there arrival was 160/90. She does express she had an episode in August in which her blood pressure fluctuated some. No chest pain, no shortness, no neuroglial symptoms.  Medic vitals   160/90 80hr 18rr 97%ra

## 2023-03-01 NOTE — Discharge Instructions (Addendum)
Rebekah Bush was seen in the emergency department for having high blood pressure.  Since she was not having any symptoms like headache or chest pain, we did not do any labs or testing.  She received a dose of a medicine called lisinopril.  She is going to start taking 5 mg of lisinopril each day.  It does not matter what time she takes lisinopril, she can just take it at the same time each day.  She may continue taking all of her medications as prescribed.  She should make an appointment with her primary care doctor this week to discuss management of her hypertension.

## 2023-03-03 ENCOUNTER — Encounter: Payer: Self-pay | Admitting: Internal Medicine

## 2023-03-03 ENCOUNTER — Ambulatory Visit: Payer: Medicare Other | Admitting: Internal Medicine

## 2023-03-03 VITALS — BP 120/70 | HR 70 | Temp 98.0°F | Resp 18 | Ht 60.0 in | Wt 104.0 lb

## 2023-03-03 DIAGNOSIS — I10 Essential (primary) hypertension: Secondary | ICD-10-CM | POA: Diagnosis not present

## 2023-03-03 DIAGNOSIS — R634 Abnormal weight loss: Secondary | ICD-10-CM | POA: Diagnosis not present

## 2023-03-03 MED ORDER — LISINOPRIL 5 MG PO TABS: 5.0000 mg | ORAL_TABLET | Freq: Every day | ORAL | 3 refills | Status: DC

## 2023-03-03 NOTE — Assessment & Plan Note (Signed)
Her BP is controlled today.  We will continue her current BP meds.

## 2023-03-03 NOTE — Assessment & Plan Note (Signed)
She has lost some weight but she states she has not been eating as much.  I asked her to eat 3 meals a day and we will see what her weight is doing in 3 weeks when she returns for followup.

## 2023-03-03 NOTE — Progress Notes (Signed)
Office Visit  Subjective   Patient ID: Rebekah Bush   DOB: 10/09/1929   Age: 89 y.o.   MRN: [id number]   Chief Complaint Chief Complaint  Patient presents with   Follow-up    Hospital follow up     History of Present Illness Rebekah Bush is a 89 yo female who comes in today for an ER followup.  She noted on 02/28/2023 that her BP was elevated.  She was not having any symptoms at that time and the next day she noted her systolic BP was over 200.  She went to the [facility] ER on 03/01/2023 and they noted she had asymptomatic hypertension.  Patient says that she was not feeling good,but was not having  any headache or chest pain.  She had her blood pressure checked at her independent living, was 200/100 so they sent her to the emergency department.  EMS blood pressure is 160/90.  Her blood pressure was little bit elevated at 178/96.  They asked her to restart lisinopril 5mg  daily which she has remained.  She is now checking her BP at home where her SBP has been running 130's.  She had an episode of presyncope where she was hospitalized in 05/2022.  They stated that given her advanced age and very favorable TTE,that they did not recommend strict blood pressure control.  She is currently on hydralazine 50mg  BID, lisinopril 5mg  daily and Toprol XL 100mg  daily.  She will have occasional LE edema where she will take lasix 20mg  as needed.  She denies any intolerance to her BP meds.  She denies any lightheadedness, dizziness, blurred vision, double vision, chest pain, palpitations, SOB, generalized weakness or edema.  Again, we were not trying to be strict with her BP control due to her history of presyncope.     Past Medical History Past Medical History:  Diagnosis Date   Basal cell carcinoma 08/29/2019   sclerosis on right temple   Chronic diastolic (congestive) heart failure (HCC)    Closed dislocation of right shoulder 05/07/2019   Closed fracture of superior ramus of pubis, initial encounter  (HCC) 05/07/2019   Gout    Hypertension    Hypothyroidism    Retinal detachment    OD [facility]   Shoulder dislocation, right, initial encounter 05/08/2019   Squamous cell carcinoma of skin 08/29/2019   in situ on left buccal cheek   Squamous cell carcinoma of skin 08/29/2019   in situ on left upper arm, posterior   Squamous cell carcinoma of skin 08/29/2019   in situ on left forearm, posterior     Allergies Allergies  Allergen Reactions   Bactrim [Sulfamethoxazole-Trimethoprim] Shortness Of Breath, Nausea And Vomiting and Other (See Comments)    Headaches   Sulfa Antibiotics Shortness Of Breath, Nausea And Vomiting and Other (See Comments)    Headaches      Medications  Current Outpatient Medications:    hydrALAZINE (APRESOLINE) 50 MG tablet, Take 1 tablet (50 mg total) by mouth in the morning and at bedtime., Disp: 180 tablet, Rfl: 0   ibuprofen (ADVIL) 200 MG tablet, Take 200 mg by mouth daily as needed for moderate pain., Disp: , Rfl:    levothyroxine (SYNTHROID) 50 MCG tablet, Take 1 tablet (50 mcg total) by mouth daily., Disp: 30 tablet, Rfl: 11   lisinopril (ZESTRIL) 5 MG tablet, Take 1 tablet (5 mg total) by mouth daily., Disp: 30 tablet, Rfl: 0   magnesium oxide (MAG-OX) 400 MG tablet,  Take 400 mg by mouth daily., Disp: , Rfl:    metoprolol succinate (TOPROL-XL) 100 MG 24 hr tablet, Take 1 tablet (100 mg total) by mouth See admin instructions. 100 mg once daily in the evening. HOLD for low blood pressure., Disp: 90 tablet, Rfl: 0   Review of Systems Review of Systems  Eyes:  Negative for blurred vision and double vision.  Respiratory:  Negative for shortness of breath.   Cardiovascular:  Negative for chest pain, palpitations and leg swelling.  Gastrointestinal:  Negative for abdominal pain, constipation, diarrhea, nausea and vomiting.  Neurological:  Negative for dizziness, weakness and headaches.       Objective:    Vitals BP 120/70 (BP Location: Left  Arm, Patient Position: Sitting, Cuff Size: Normal)   Pulse 70   Temp 98 F (36.7 C)   Resp 18   Ht 5' (1.524 m)   Wt 104 lb (47.2 kg)   SpO2 94%   BMI 20.31 kg/m    Physical Examination Physical Exam Constitutional:      Appearance: Normal appearance. She is not ill-appearing.  Cardiovascular:     Rate and Rhythm: Normal rate and regular rhythm.     Pulses: Normal pulses.     Heart sounds: No murmur heard.    No friction rub. No gallop.  Pulmonary:     Effort: Pulmonary effort is normal. No respiratory distress.     Breath sounds: No wheezing, rhonchi or rales.  Abdominal:     General: Bowel sounds are normal. There is no distension.     Palpations: Abdomen is soft.     Tenderness: There is no abdominal tenderness.  Musculoskeletal:     Right lower leg: No edema.     Left lower leg: No edema.  Skin:    General: Skin is warm and dry.     Findings: No rash.  Neurological:     Mental Status: She is alert.        Assessment & Plan:   Essential hypertension Her BP is controlled today.  We will continue her current BP meds.  Weight loss She has lost some weight but she states she has not been eating as much.  I asked her to eat 3 meals a day and we will see what her weight is doing in 3 weeks when she returns for followup.    No follow-ups on file.   Crist Fat, MD

## 2023-03-05 ENCOUNTER — Encounter (HOSPITAL_COMMUNITY): Payer: Self-pay

## 2023-03-05 ENCOUNTER — Other Ambulatory Visit: Payer: Self-pay

## 2023-03-05 ENCOUNTER — Emergency Department (HOSPITAL_COMMUNITY)
Admission: EM | Admit: 2023-03-05 | Discharge: 2023-03-06 | Disposition: A | Discharge status: Home / Self Care | Payer: Medicare Other | Attending: Emergency Medicine | Admitting: Emergency Medicine

## 2023-03-05 DIAGNOSIS — Z79899 Other long term (current) drug therapy: Secondary | ICD-10-CM | POA: Insufficient documentation

## 2023-03-05 DIAGNOSIS — I10 Essential (primary) hypertension: Secondary | ICD-10-CM | POA: Diagnosis present

## 2023-03-05 LAB — CBC WITH DIFFERENTIAL/PLATELET
Abs Immature Granulocytes: 0.03 10*3/uL (ref 0.00–0.07)
Basophils Absolute: 0.1 10*3/uL (ref 0.0–0.1)
Basophils Relative: 1 %
Eosinophils Absolute: 0.2 10*3/uL (ref 0.0–0.5)
Eosinophils Relative: 2 %
HCT: 48.9 % — ABNORMAL HIGH (ref 36.0–46.0)
Hemoglobin: 16.1 g/dL — ABNORMAL HIGH (ref 12.0–15.0)
Immature Granulocytes: 0 %
Lymphocytes Relative: 12 %
Lymphs Abs: 0.9 10*3/uL (ref 0.7–4.0)
MCH: 30.1 pg (ref 26.0–34.0)
MCHC: 32.9 g/dL (ref 30.0–36.0)
MCV: 91.4 fL (ref 80.0–100.0)
Monocytes Absolute: 0.9 10*3/uL (ref 0.1–1.0)
Monocytes Relative: 11 %
Neutro Abs: 5.8 10*3/uL (ref 1.7–7.7)
Neutrophils Relative %: 74 %
Platelets: 295 10*3/uL (ref 150–400)
RBC: 5.35 MIL/uL — ABNORMAL HIGH (ref 3.87–5.11)
RDW: 14.7 % (ref 11.5–15.5)
WBC: 7.8 10*3/uL (ref 4.0–10.5)
nRBC: 0 % (ref 0.0–0.2)

## 2023-03-05 NOTE — ED Triage Notes (Addendum)
Pt to ED by EMS from [facility] home asst living with c/o hypertension. This has been an ongoing issue over the past year for the pt, progressively much worse over the past week. Arrives A+O, BP is 188/118, all other vss, NADN. Pt denies any pain. Pt was seen here on Tuesday for the same.

## 2023-03-05 NOTE — ED Provider Notes (Signed)
[place] EMERGENCY DEPARTMENT AT [facility] Provider Note   CSN: [id number] Arrival date & time: 03/05/23  2243     History {Add pertinent medical, surgical, social history, OB history to HPI:1} Chief Complaint  Patient presents with   Hypertension    Rebekah Bush is a 89 y.o. female.  Patient presents with elevated blood pressure.  Recent visits for same with both the ED and her PCP.  States she has been checking her blood pressure twice daily and normally runs in the 1 4150 range systolic.  Today she became concerned because it was 211 systolic despite taking her evening medications.  She reports compliance with her medications which include metoprolol, lisinopril and hydralazine.  Recently restarted lisinopril after not having it for several years.  She lives independently at [facility] something but was at her daughter's house today by herself and became concerned about her elevated blood pressure.  She denies any headache, visual changes, chest pain, shortness of breath, dizziness, lightheadedness, room spinning, urinal weakness, numbness, tingling, difficulty speaking or difficulty swallowing. Report compliance with her blood pressure medications.  She also saw her PCP 2 days ago after her ED visit on the 12th and had a reassuring evaluation with no changes to her medication.  She reports compliance with her medications.  The history is provided by the patient.  Hypertension Pertinent negatives include no chest pain, no abdominal pain, no headaches and no shortness of breath.       Home Medications Prior to Admission medications   Medication Sig Start Date End Date Taking? Authorizing Provider  hydrALAZINE (APRESOLINE) 50 MG tablet Take 1 tablet (50 mg total) by mouth in the morning and at bedtime. 12/30/22   Crist Fat, MD  ibuprofen (ADVIL) 200 MG tablet Take 200 mg by mouth daily as needed for moderate pain.    [provider]  levothyroxine  (SYNTHROID) 50 MCG tablet Take 1 tablet (50 mcg total) by mouth daily. 12/30/22 12/30/23  Crist Fat, MD  lisinopril (ZESTRIL) 5 MG tablet Take 1 tablet (5 mg total) by mouth daily. 03/03/23   Crist Fat, MD  magnesium oxide (MAG-OX) 400 MG tablet Take 400 mg by mouth daily.    [provider]  metoprolol succinate (TOPROL-XL) 100 MG 24 hr tablet Take 1 tablet (100 mg total) by mouth See admin instructions. 100 mg once daily in the evening. HOLD for low blood pressure. 12/28/22   Crist Fat, MD      Allergies    Bactrim [sulfamethoxazole-trimethoprim] and Sulfa antibiotics    Review of Systems   Review of Systems  Constitutional:  Negative for activity change, appetite change and fever.  HENT:  Negative for congestion and rhinorrhea.   Eyes:  Negative for visual disturbance.  Respiratory:  Negative for cough, chest tightness and shortness of breath.   Cardiovascular:  Negative for chest pain.  Gastrointestinal:  Negative for abdominal pain, nausea and vomiting.  Genitourinary:  Negative for dysuria and hematuria.  Skin:  Negative for rash.  Neurological:  Negative for weakness and headaches.  Hematological:  Negative for adenopathy.  Psychiatric/Behavioral:  Negative for confusion.     all other systems are negative except as noted in the HPI and PMH.   Physical Exam Updated Vital Signs BP (!) 188/118 (BP Location: Left Arm)   Pulse 69   Temp (!) 97.5 F (36.4 C)   Resp 18   SpO2 98%  Physical Exam  ED Results /  Procedures / Treatments   Labs (all labs ordered are listed, but only abnormal results are displayed) Labs Reviewed  CBC WITH DIFFERENTIAL/PLATELET  COMPREHENSIVE METABOLIC PANEL  TROPONIN I (HIGH SENSITIVITY)    EKG None  Radiology No results found.  Procedures Procedures  {Document cardiac monitor, telemetry assessment procedure when appropriate:1}  Medications Ordered in ED Medications - No data to display  ED Course/ Medical  Decision Making/ A&P   {   Click here for ABCD2, HEART and other calculatorsREFRESH Note before signing :1}                              Medical Decision Making Amount and/or Complexity of Data Reviewed Independent Historian: EMS Labs: ordered. Decision-making details documented in ED Course. Radiology: ordered and independent interpretation performed. Decision-making details documented in ED Course. ECG/medicine tests: ordered and independent interpretation performed. Decision-making details documented in ED Course.   Asymptomatic hypertension.  188/118 on arrival.  Denies any chest pain or shortness of breath.  No headache or visual changes.  Neurologic exam is nonfocal.  Low suspicion for hypertensive urgency or emergency.  Given return visit will check screening labs and EKG. No indication for imaging. {Document critical care time when appropriate:1} {Document review of labs and clinical decision tools ie heart score, Chads2Vasc2 etc:1}  {Document your independent review of radiology images, and any outside records:1} {Document your discussion with family members, caretakers, and with consultants:1} {Document social determinants of health affecting pt's care:1} {Document your decision making why or why not admission, treatments were needed:1} Final Clinical Impression(s) / ED Diagnoses Final diagnoses:  None    Rx / DC Orders ED Discharge Orders     None

## 2023-03-06 DIAGNOSIS — I10 Essential (primary) hypertension: Secondary | ICD-10-CM | POA: Diagnosis not present

## 2023-03-06 LAB — COMPREHENSIVE METABOLIC PANEL
ALT: 17 U/L (ref 0–44)
AST: 31 U/L (ref 15–41)
Albumin: 4.5 g/dL (ref 3.5–5.0)
Alkaline Phosphatase: 84 U/L (ref 38–126)
Anion gap: 13 (ref 5–15)
BUN: 18 mg/dL (ref 8–23)
CO2: 25 mmol/L (ref 22–32)
Calcium: 9.7 mg/dL (ref 8.9–10.3)
Chloride: 94 mmol/L — ABNORMAL LOW (ref 98–111)
Creatinine, Ser: 0.6 mg/dL (ref 0.44–1.00)
GFR, Estimated: >60 mL/min (ref >60)
Glucose, Bld: 107 mg/dL — ABNORMAL HIGH (ref 70–99)
Potassium: 3.3 mmol/L — ABNORMAL LOW (ref 3.5–5.1)
Sodium: 132 mmol/L — ABNORMAL LOW (ref 135–145)
Total Bilirubin: 1.1 mg/dL (ref <1.2)
Total Protein: 8 g/dL (ref 6.5–8.1)

## 2023-03-06 LAB — TROPONIN I (HIGH SENSITIVITY): Troponin I (High Sensitivity): 9 ng/L (ref <18)

## 2023-03-06 MED ORDER — ACETAMINOPHEN 325 MG PO TABS
650.0000 mg | ORAL_TABLET | Freq: Once | ORAL | Status: AC
  Administered 2023-03-06: 650 mg via ORAL
  Filled 2023-03-06: qty 2

## 2023-03-06 MED ORDER — POTASSIUM CHLORIDE CRYS ER 20 MEQ PO TBCR
40.0000 meq | EXTENDED_RELEASE_TABLET | Freq: Once | ORAL | Status: AC
  Administered 2023-03-06: 40 meq via ORAL
  Filled 2023-03-06: qty 2

## 2023-03-06 MED ORDER — LISINOPRIL 10 MG PO TABS
10.0000 mg | ORAL_TABLET | Freq: Once | ORAL | Status: AC
  Administered 2023-03-06: 10 mg via ORAL
  Filled 2023-03-06: qty 1

## 2023-03-06 NOTE — Discharge Instructions (Addendum)
Keep a record of your blood pressure and follow-up with your doctor for medication adjustments.  Return to the ED with headache, chest pain, shortness of breath, nausea, vomiting, sweating, other concerns. Thank you for the opportunity to take care of you in our Emergency Department. You have been diagnosed with high blood pressure, also known as hypertension. This means that the force of blood against the walls of your blood vessels called is too strong. It also means that your heart has to work harder to move the blood. High blood pressure usually has no symptoms, but over time, it can cause serious health problems such as Heart attack and heart failure Stroke Kidney disease and failure Vision loss With the help from your healthcare provider and some important life style changes, you can manage your blood pressure and protect your health. Please read the instructions provided on hypertension, how to manage it and how to check your blood pressure. Additionally, use the blood pressure log provided to record your blood pressures. Take the blood pressure log with you to your primary care doctor so that they can adjust your blood pressure medications if needed. Please read the instructions on follow-up appointment. Return to the ER or Call 911 right away if you have any of these symptoms: Chest pain or shortness of breath Severe headache Weakness, tingling, or numbness of your face, arms, or legs (especially on 1 side of the body) Sudden change in vision Confusion, trouble speaking, or trouble understanding speech

## 2023-03-08 ENCOUNTER — Other Ambulatory Visit: Payer: Self-pay | Admitting: Pharmacist

## 2023-03-08 ENCOUNTER — Telehealth: Payer: Self-pay | Admitting: Pharmacist

## 2023-03-08 DIAGNOSIS — I10 Essential (primary) hypertension: Secondary | ICD-10-CM

## 2023-03-08 NOTE — Patient Instructions (Signed)
Ms Read,   It was great talking to you today! I'm glad your blood pressure is doing better.   You have a history of low potassium. Increasing the dose of the lisinopril may help raise your potassium to a normal level. This could allow for the dose of hydralazine to be reduced or possibly stopped in the future. Hydralazine may not be lasting consistently throughout the day, which could be contributing to why your readings are more elevated sometimes.   I would advise you to avoid taking ibuprofen daily, as this can elevate blood pressure. Acetaminophen (Tylenol) does not impact blood pressure in the same way. You can take the extra strength 500 or 650 mg twice daily.   Talk to Dr. Leonia Reader about how often you NEED to be checking your blood pressures. I wonder if checking multiple times a day is causing stress, which is raising your blood pressures. Once things are more consistent, I would think about checking no more than 1-2 times weekly, as long as you do not feel symptoms of high or low blood pressure.   Check your blood pressure periodically, and any time you have concerning symptoms like headache, chest pain, dizziness, shortness of breath, or vision changes.   Our goal is around 140/90.  To appropriately check your blood pressure, make sure you do the following:  1) Avoid caffeine, exercise, or tobacco products for 30 minutes before checking. Empty your bladder. 2) Sit with your back supported in a flat-backed chair. Rest your arm on something flat (arm of the chair, table, etc). 3) Sit still with your feet flat on the floor, resting, for at least 5 minutes.  4) Check your blood pressure. Take 1-2 readings.  5) Write down these readings and bring with you to any provider appointments.  Bring your home blood pressure machine with you to a provider's office for accuracy comparison at least once a year.   Make sure you take your blood pressure medications before you come to any office visit,  even if you were asked to fast for labs.   Take care!  Catie Eppie Gibson, PharmD, BCACP, CPP Clinical Pharmacist [facility] [phone]

## 2023-03-08 NOTE — Progress Notes (Unsigned)
Attempted to contact patient regarding referral from the ED for hypertension.    Left HIPAA compliant message for patient to return my call at their convenience.   Catie Eppie Gibson, PharmD, BCACP, CPP Clinical Pharmacist [facility] [phone]

## 2023-03-08 NOTE — Progress Notes (Signed)
03/08/2023 Name: Rebekah Bush MRN: [id number] DOB: Jun 02, 1929  Chief Complaint  Patient presents with   Medication Management   Hypertension    Rebekah Bush is a 89 y.o. year old female who presented for a telephone visit.   They were referred to the pharmacist by  ED Provider  for assistance in managing hypertension.    Subjective:  Care Team: Primary Care Provider: Leonia Reader, Barbara Cower, MD ; Next Scheduled Visit: 11/21  Medication Access/Adherence  Current Pharmacy:  [facility] [address] [place], [place] - [address] [place] [address] Phone: [phone] Fax: [phone]  [id number] - ,  - [address] AT [facility] [address] [place] [address] Phone: [phone] Fax: [phone]  [facility] - [place], [place] - [address] [place] [address] Phone: [phone] Fax: [phone]   Patient reports affordability concerns with their medications: No  Patient reports access/transportation concerns to their pharmacy: No  Patient reports adherence concerns with their medications:  No     Hypertension:  Current medications: hydralazine 50 mg twice daily, metoprolol succinate 100 mg daily in the morning, lisinopril 5 mg daily in the evening  Was seen by PCP on 11/14, lisinopril 5 mg daily started.   Patient has a validated, automated, upper arm home BP cuff Current blood pressure readings readings: ~140/80s  Currently checking 3 times daily -~ 10 am, 3 pm, ~ 7 pm. Reports she does think worrying about her blood pressure casues stress  Patient denies hypotensive s/sx including dizziness, lightheadedness.  Patient denies hypertensive symptoms including headache, chest pain, shortness of breath. Reports she was flushed prior to going to the emergency room, but has not had this symptom lately  Physical  activity: walks ~ a mile 3 times a week  Nutritional patterns: minimizes salt, eats fruits and vegetables   Chronic Pain: Taking ibuprofen 200 mg daily for arthritis pain  Objective:  Lab Results  Component Value Date   HGBA1C 5.8 (H) 08/19/2011    Lab Results  Component Value Date   CREATININE 0.60 03/05/2023   BUN 18 03/05/2023   NA 132 (L) 03/05/2023   K 3.3 (L) 03/05/2023   CL 94 (L) 03/05/2023   CO2 25 03/05/2023    Lab Results  Component Value Date   CHOL 186 08/19/2011   HDL 86 08/19/2011   LDLCALC 84 08/19/2011   TRIG 81 08/19/2011   CHOLHDL 2.2 08/19/2011    Medications Reviewed Today     Reviewed by Alden Hipp, RPH-CPP (Pharmacist) on 03/08/23 at 1603  Med List Status: <None>   Medication Order Taking? Sig Documenting Provider Last Dose Status Informant  acetaminophen (TYLENOL) 500 MG tablet [id number] Yes Take 500 mg by mouth every 6 (six) hours as needed. [provider] Taking Active   hydrALAZINE (APRESOLINE) 50 MG tablet [id number] Yes Take 1 tablet (50 mg total) by mouth in the morning and at bedtime. Leonia Reader, Barbara Cower, MD Taking Active   ibuprofen (ADVIL) 200 MG tablet [id number] Yes Take 200 mg by mouth daily as needed for moderate pain. [provider] Taking Active Self  levothyroxine (SYNTHROID) 50 MCG tablet [id number] Yes Take 1 tablet (50 mcg total) by mouth daily. Leonia Reader, Barbara Cower, MD Taking Active   lisinopril (ZESTRIL) 5 MG tablet [id number] Yes Take 1 tablet (5 mg total) by mouth daily. Crist Fat, MD Taking Active  magnesium oxide (MAG-OX) 400 MG tablet [id number] Yes Take 400 mg by mouth daily. [provider] Taking Active Self  metoprolol succinate (TOPROL-XL) 100 MG 24 hr tablet [id number] Yes Take 1 tablet (100 mg total) by mouth See admin instructions. 100 mg once daily in the evening. HOLD for low blood pressure. Crist Fat, MD Taking Active              Hypertension: - Currently controlled per  recent home readings, goal <150/90 likely appropriate given age, history of hypotension and syncopal episodes.  - Reviewed long term cardiovascular and renal outcomes of uncontrolled blood pressure - Reviewed appropriate blood pressure monitoring technique and reviewed goal blood pressure. Recommended to check home blood pressure and heart rate no more than once daily, possibly less. Concerned that checking multiple times a day is causing more stress and raising blood pressure.  - Noted history of hypokalemia. Consider increasing lisinopril dose to 10 mg daily. Could reduce hydralazine dose to 25 mg twice daily. Hydralazine is often dosed 3 times daily, it may be that it is wearing off mid-day and contributing to elevations. Could have better coverage around the clock with lisinopril and metoprolol. Encouraged to discuss with Dr. Leonia Reader later this week.  - Educated that ibuprofen can elevate blood pressures. Advised to take acetaminophen for arthritis pain instead, and reserve ibuprofen for more severe pain days. Patient verbalized understanding.  - Praised patient for dietary and lifestyle modifications   Catie Eppie Gibson, PharmD, BCACP, CPP Clinical Pharmacist [facility] [phone]

## 2023-03-10 ENCOUNTER — Ambulatory Visit: Payer: Medicare Other | Admitting: Internal Medicine

## 2023-03-10 ENCOUNTER — Encounter: Payer: Self-pay | Admitting: Internal Medicine

## 2023-03-10 VITALS — BP 150/90 | HR 55 | Temp 97.1°F | Resp 18 | Ht 60.0 in | Wt 114.0 lb

## 2023-03-10 DIAGNOSIS — I10 Essential (primary) hypertension: Secondary | ICD-10-CM

## 2023-03-10 NOTE — Assessment & Plan Note (Signed)
Her BP diary is noted.  I am going to start chronic care management on her and send her a BP monitor to her home.  She is to continue her current meds.  I have asked her to watch for the symptoms (discussed) with high BP.

## 2023-03-10 NOTE — Progress Notes (Signed)
Office Visit  Subjective   Patient ID: Rebekah Bush   DOB: 1930/01/19   Age: 89 y.o.   MRN: [id number]   Chief Complaint Chief Complaint  Patient presents with   Follow-up    Follow up ER      History of Present Illness Rebekah Bush is a 89 yo female who comes in today for an ER followup where she was seen on 03/05/2023 once again for elevated BP.  I saw her on 03/03/2023 and made no change to her meds.  I asked her to only check her BP once a day about 2-3 hours after taking her BP meds in the morning.  She noted on 03/05/2023 that her systolic BP was 220 but on arrival to the ER her BP was 188/118 and she was asymptomatic.  There was low suspicion for hypertensive urgency or emergency.  They checked labs (her K+ was low) and EKG which were all reassuring.  They discussed Discussed follow-up with her PCP for further evaluation of her blood pressure and also discussed returning to the ED with headache, visual changes, chest pain, shortness of breath, dizziness, lightheadedness or other concerns.  Today, she brings in a BP diary.  She is still checking her BP 3 times a day.  Her SBP has run 120-140's over the last 3 days.  Again, she was seen in the ER on 11/11 and 11/12  for elevated BP.  She was not having any symptoms at that time and the next day she noted her systolic BP was over 200.  She went to the [facility] ER on 03/01/2023 and they noted she had asymptomatic hypertension.  Patient says that she was not feeling good,but was not having  any headache or chest pain.  She had her blood pressure checked at her independent living, was 200/100 so they sent her to the emergency department.  EMS blood pressure is 160/90.  Her blood pressure was little bit elevated at 178/96.  They asked her to restart lisinopril 5mg  daily which she has remained.  She had an episode of presyncope where she was hospitalized in 05/2022.  They stated that given her advanced age and very favorable TTE,that they did not  recommend strict blood pressure control.  She is currently on hydralazine 50mg  BID, lisinopril 5mg  night and Toprol XL 100mg  daily.  She will have occasional LE edema where she will take lasix 20mg  as needed.  She denies any intolerance to her BP meds.  She denies any lightheadedness, dizziness, blurred vision, double vision, chest pain, palpitations, SOB, generalized weakness or edema.       Past Medical History Past Medical History:  Diagnosis Date   Basal cell carcinoma 08/29/2019   sclerosis on right temple   Chronic diastolic (congestive) heart failure (HCC)    Closed dislocation of right shoulder 05/07/2019   Closed fracture of superior ramus of pubis, initial encounter (HCC) 05/07/2019   Gout    Hypertension    Hypothyroidism    Retinal detachment    OD [facility]   Shoulder dislocation, right, initial encounter 05/08/2019   Squamous cell carcinoma of skin 08/29/2019   in situ on left buccal cheek   Squamous cell carcinoma of skin 08/29/2019   in situ on left upper arm, posterior   Squamous cell carcinoma of skin 08/29/2019   in situ on left forearm, posterior     Allergies Allergies  Allergen Reactions   Bactrim [Sulfamethoxazole-Trimethoprim] Shortness Of Breath, Nausea And  Vomiting and Other (See Comments)    Headaches   Sulfa Antibiotics Shortness Of Breath, Nausea And Vomiting and Other (See Comments)    Headaches      Medications  Current Outpatient Medications:    acetaminophen (TYLENOL) 500 MG tablet, Take 500 mg by mouth every 6 (six) hours as needed., Disp: , Rfl:    hydrALAZINE (APRESOLINE) 50 MG tablet, Take 1 tablet (50 mg total) by mouth in the morning and at bedtime., Disp: 180 tablet, Rfl: 0   ibuprofen (ADVIL) 200 MG tablet, Take 200 mg by mouth daily as needed for moderate pain., Disp: , Rfl:    levothyroxine (SYNTHROID) 50 MCG tablet, Take 1 tablet (50 mcg total) by mouth daily., Disp: 30 tablet, Rfl: 11   lisinopril (ZESTRIL) 5 MG tablet,  Take 1 tablet (5 mg total) by mouth daily., Disp: 90 tablet, Rfl: 3   magnesium oxide (MAG-OX) 400 MG tablet, Take 400 mg by mouth daily., Disp: , Rfl:    metoprolol succinate (TOPROL-XL) 100 MG 24 hr tablet, Take 1 tablet (100 mg total) by mouth See admin instructions. 100 mg once daily in the evening. HOLD for low blood pressure., Disp: 90 tablet, Rfl: 0   Review of Systems Review of Systems  Eyes:  Negative for blurred vision and double vision.  Respiratory:  Negative for shortness of breath.   Cardiovascular:  Negative for chest pain, palpitations and leg swelling.  Gastrointestinal:  Negative for abdominal pain, nausea and vomiting.  Musculoskeletal:  Negative for myalgias.  Neurological:  Negative for dizziness, weakness and headaches.       Objective:    Vitals BP (!) 150/90 (BP Location: Left Arm, Patient Position: Sitting, Cuff Size: Normal)   Pulse (!) 55   Temp (!) 97.1 F (36.2 C)   Resp 18   Ht 5' (1.524 m)   Wt 114 lb (51.7 kg)   SpO2 92%   BMI 22.26 kg/m    Physical Examination Physical Exam Constitutional:      Appearance: Normal appearance. She is not ill-appearing.  Cardiovascular:     Rate and Rhythm: Normal rate and regular rhythm.     Pulses: Normal pulses.     Heart sounds: No murmur heard.    No friction rub. No gallop.  Pulmonary:     Effort: Pulmonary effort is normal. No respiratory distress.     Breath sounds: No wheezing, rhonchi or rales.  Abdominal:     General: Bowel sounds are normal. There is no distension.     Palpations: Abdomen is soft.     Tenderness: There is no abdominal tenderness.  Musculoskeletal:     Right lower leg: No edema.     Left lower leg: No edema.  Skin:    General: Skin is warm and dry.     Findings: No rash.  Neurological:     Mental Status: She is alert.        Assessment & Plan:   Essential hypertension Her BP diary is noted.  I am going to start chronic care management on her and send her a BP monitor  to her home.  She is to continue her current meds.  I have asked her to watch for the symptoms (discussed) with high BP.    No follow-ups on file.   Crist Fat, MD

## 2023-03-12 ENCOUNTER — Other Ambulatory Visit: Payer: Self-pay | Admitting: Internal Medicine

## 2023-03-18 ENCOUNTER — Other Ambulatory Visit: Payer: Self-pay | Admitting: Internal Medicine

## 2023-03-22 DIAGNOSIS — I5032 Chronic diastolic (congestive) heart failure: Secondary | ICD-10-CM

## 2023-03-22 DIAGNOSIS — I10 Essential (primary) hypertension: Secondary | ICD-10-CM

## 2023-03-24 ENCOUNTER — Other Ambulatory Visit: Payer: Self-pay | Admitting: Internal Medicine

## 2023-03-24 ENCOUNTER — Encounter: Payer: Self-pay | Admitting: Internal Medicine

## 2023-03-24 ENCOUNTER — Ambulatory Visit: Payer: Medicare Other | Admitting: Internal Medicine

## 2023-03-24 VITALS — BP 124/88 | HR 67 | Temp 97.6°F | Resp 18 | Ht 60.0 in | Wt 115.0 lb

## 2023-03-24 DIAGNOSIS — I5032 Chronic diastolic (congestive) heart failure: Secondary | ICD-10-CM | POA: Diagnosis not present

## 2023-03-24 DIAGNOSIS — I10 Essential (primary) hypertension: Secondary | ICD-10-CM | POA: Diagnosis not present

## 2023-03-24 DIAGNOSIS — E039 Hypothyroidism, unspecified: Secondary | ICD-10-CM

## 2023-03-24 NOTE — Assessment & Plan Note (Signed)
We are setting her up with a BP monitor to do chronic care management.  Her BP seems to be doing well.

## 2023-03-24 NOTE — Assessment & Plan Note (Signed)
There is no evidence of decompensation or volume overload.  I asked her to watch her salt intake.  She can use her lasix prn.

## 2023-03-24 NOTE — Progress Notes (Signed)
Office Visit  Subjective   Patient ID: Rebekah Bush   DOB: 1929-12-31   Age: 89 y.o.   MRN: [id number]   Chief Complaint Chief Complaint  Patient presents with   Follow-up    3 month follow up     History of Present Illness Rebekah Bush is a 89 yo female who comes in today for followup of her HTN.  I saw her last week where she was in the ER again for elevated BP. Since her last visit, she states her BP has been running systollically in the 120-130's range.  She is currently on:  lisinopril 5mg  daily, Toprol XL 100mg  daily and hydralzine 50mg  BID.  She had an episode of presyncope where she was hospitalized in 05/2022.  They stated that given her advanced age and very favorable TTE,that they did not recommend strict blood pressure control.  She will have occasional LE edema where she will take lasix 20mg  as needed.  She denies any intolerance to her BP meds.  She denies any lightheadedness, dizziness, blurred vision, double vision, chest pain, palpitations, SOB, generalized weakness or edema.  I have set her up for chronic care management for her BP and she is currently waiting on her BP cuff.    The patient also has a history of hypothyroidism which she states she has been taking medications for at least 20 years.  She states they discovered it on routine blood testing.  On her last visit, her Free T4 was too high and I decreased her levothyroxine from daily to daily.  Today, she denies any heat/cold intolerance, unexplained weight changes, tremors, anxiety, insomnia, dry skin, diarrhea, constipation or other problems.  She also was noted to have chronic diastolic congestive heart failure.  They noted during her hospitalization that she appeared volume depleted and gave her IVF's.  They performed an ECHO in 05/2022 and this demonstrated a normal LVEF 60-65% without wall motion abnormalities and only grade 1 DD w/ no significant valvular abnormalities.  She was having some DOE at that  time but they felt this was simply age related deconditioning.  Again, she uses lasix for peripheral edema prn but she has not had to take this in a while.  She is using exercise treadmill and she does this 3 times per week.     Past Medical History Past Medical History:  Diagnosis Date   Basal cell carcinoma 08/29/2019   sclerosis on right temple   Chronic diastolic (congestive) heart failure (HCC)    Closed dislocation of right shoulder 05/07/2019   Closed fracture of superior ramus of pubis, initial encounter (HCC) 05/07/2019   Gout    Hypertension    Hypothyroidism    Retinal detachment    OD [facility]   Shoulder dislocation, right, initial encounter 05/08/2019   Squamous cell carcinoma of skin 08/29/2019   in situ on left buccal cheek   Squamous cell carcinoma of skin 08/29/2019   in situ on left upper arm, posterior   Squamous cell carcinoma of skin 08/29/2019   in situ on left forearm, posterior     Allergies Allergies  Allergen Reactions   Bactrim [Sulfamethoxazole-Trimethoprim] Shortness Of Breath, Nausea And Vomiting and Other (See Comments)    Headaches   Sulfa Antibiotics Shortness Of Breath, Nausea And Vomiting and Other (See Comments)    Headaches      Medications  Current Outpatient Medications:    acetaminophen (TYLENOL) 500 MG tablet, Take 500 mg  by mouth every 6 (six) hours as needed., Disp: , Rfl:    hydrALAZINE (APRESOLINE) 50 MG tablet, TAKE 1 TABLET EVERY MORNING AND TAKE 1 TABLET AT BEDTIME, Disp: 180 tablet, Rfl: 3   ibuprofen (ADVIL) 200 MG tablet, Take 200 mg by mouth daily as needed for moderate pain., Disp: , Rfl:    levothyroxine (SYNTHROID) 50 MCG tablet, Take 1 tablet (50 mcg total) by mouth daily., Disp: 30 tablet, Rfl: 11   lisinopril (ZESTRIL) 5 MG tablet, Take 1 tablet (5 mg total) by mouth daily., Disp: 90 tablet, Rfl: 3   magnesium oxide (MAG-OX) 400 MG tablet, Take 400 mg by mouth daily., Disp: , Rfl:    metoprolol succinate  (TOPROL-XL) 100 MG 24 hr tablet, TAKE 1 TABLET EVERY EVENING AND HOLD FOR LOW BLOOD PRESSURE, Disp: 90 tablet, Rfl: 3   Review of Systems Review of Systems  Constitutional:  Negative for chills and fever.  Eyes:  Negative for blurred vision and double vision.  Respiratory:  Negative for cough and shortness of breath.   Cardiovascular:  Negative for chest pain, palpitations and leg swelling.  Gastrointestinal:  Negative for abdominal pain, constipation, diarrhea, nausea and vomiting.  Genitourinary:  Negative for frequency.  Musculoskeletal:  Negative for myalgias.  Skin:  Negative for itching and rash.  Neurological:  Negative for dizziness, weakness and headaches.  Endo/Heme/Allergies:  Negative for polydipsia.       Objective:    Vitals BP 124/88 (BP Location: Left Arm, Patient Position: Sitting, Cuff Size: Normal)   Pulse 67   Temp 97.6 F (36.4 C)   Resp 18   Ht 5' (1.524 m)   Wt 115 lb (52.2 kg)   SpO2 97%   BMI 22.46 kg/m    Physical Examination Physical Exam Constitutional:      Appearance: Normal appearance. She is not ill-appearing.  Cardiovascular:     Rate and Rhythm: Normal rate and regular rhythm.     Pulses: Normal pulses.     Heart sounds: No murmur heard.    No friction rub. No gallop.  Pulmonary:     Effort: Pulmonary effort is normal. No respiratory distress.     Breath sounds: No wheezing, rhonchi or rales.  Abdominal:     General: Bowel sounds are normal. There is no distension.     Palpations: Abdomen is soft.     Tenderness: There is no abdominal tenderness.  Musculoskeletal:     Right lower leg: No edema.     Left lower leg: No edema.  Skin:    General: Skin is warm and dry.     Findings: No rash.  Neurological:     General: No focal deficit present.     Mental Status: She is alert and oriented to person, place, and time.  Psychiatric:        Mood and Affect: Mood normal.        Behavior: Behavior normal.        Assessment & Plan:    Essential hypertension We are setting her up with a BP monitor to do chronic care management.  Her BP seems to be doing well.  Chronic diastolic CHF (congestive heart failure) (HCC) There is no evidence of decompensation or volume overload.  I asked her to watch her salt intake.  She can use her lasix prn.  Acquired hypothyroidism We will check her TFT's today as we recently changed her levothyroxine.    Return in about 3 months (around 06/22/2023).  Crist Fat, MD

## 2023-03-24 NOTE — Assessment & Plan Note (Signed)
We will check her TFT's today as we recently changed her levothyroxine.

## 2023-03-25 LAB — T4, FREE: Free T4: 1.25 ng/dL (ref 0.82–1.77)

## 2023-03-25 LAB — BASIC METABOLIC PANEL
BUN/Creatinine Ratio: 26 (ref 12–28)
BUN: 21 mg/dL (ref 10–36)
CO2: 27 mmol/L (ref 20–29)
Calcium: 9.9 mg/dL (ref 8.7–10.3)
Chloride: 98 mmol/L (ref 96–106)
Creatinine, Ser: 0.81 mg/dL (ref 0.57–1.00)
Glucose: 91 mg/dL (ref 70–99)
Potassium: 4.9 mmol/L (ref 3.5–5.2)
Sodium: 140 mmol/L (ref 134–144)
eGFR: 68 mL/min/{1.73_m2} (ref >59)

## 2023-03-25 LAB — TSH: TSH: 13.8 u[IU]/mL — ABNORMAL HIGH (ref 0.450–4.500)

## 2023-03-29 ENCOUNTER — Ambulatory Visit: Payer: Medicare Other | Admitting: Internal Medicine

## 2023-03-29 DIAGNOSIS — U071 COVID-19: Secondary | ICD-10-CM | POA: Diagnosis not present

## 2023-03-29 DIAGNOSIS — R5383 Other fatigue: Secondary | ICD-10-CM | POA: Insufficient documentation

## 2023-03-29 MED ORDER — PAXLOVID (300/100) 20 X 150 MG & 10 X 100MG PO TBPK: 3.0000 | ORAL_TABLET | Freq: Two times a day (BID) | ORAL | 0 refills | Status: AC

## 2023-03-29 MED ORDER — PAXLOVID (300/100) 20 X 150 MG & 10 X 100MG PO TBPK: 3.0000 | ORAL_TABLET | Freq: Two times a day (BID) | ORAL | 0 refills | Status: DC

## 2023-03-29 NOTE — Assessment & Plan Note (Signed)
Start her on Paxlovid twice a day.  She will drink plenty of water and take Tylenol arthritis 1 tablets every 6 hour.  If her shortness of breath get worse she will go to the emergency room.

## 2023-03-29 NOTE — Addendum Note (Signed)
Addended byEloisa Northern on: 03/29/2023 06:08 PM   Modules accepted: Orders

## 2023-03-29 NOTE — Assessment & Plan Note (Signed)
Due to COVID infection.  She will drink plenty of water and rest.

## 2023-03-29 NOTE — Progress Notes (Signed)
   Acute Office Visit  Subjective:     Patient ID: Rebekah Bush, female    DOB: [birth year], 89 y.o.   MRN: [id number]  No chief complaint on file.   HPI This is a telephone visit for 89 years old female who started feeling sick on Sunday.  Yesterday her condition got worsen.  She has sore throat cough and shortness of breath.  She says that she feels extreme fatigue and barely able to walk in her apartment.  She walks with a walker.  She does not have any fever or chills.  Nurse came and checked her COVID test yesterday that was positive for COVID infection.   She is asking for Paxlovid.   ROS No fever or chills.  Complaining of extreme fatigue Positive sore throat Positive shortness of breath No nausea, vomiting or diarrhea.    Objective:    There were no vitals taken for this visit.   Physical Exam  This was a telephone visit so no vital sign or physical exam was done.  No results found for any visits on 03/29/23.      Assessment & Plan:   Problem List Items Addressed This Visit       Other   COVID-19    Start her on Paxlovid twice a day.  She will drink plenty of water and take Tylenol arthritis 1 tablets every 6 hour.  If her shortness of breath get worse she will go to the emergency room.      Fatigue - Primary    No orders of the defined types were placed in this encounter.   No follow-ups on file.  Eloisa Northern, MD

## 2023-05-19 ENCOUNTER — Ambulatory Visit: Payer: Medicare Other | Admitting: Internal Medicine

## 2023-05-19 NOTE — Progress Notes (Unsigned)
Office Visit  Subjective   Patient ID: Rebekah Bush   DOB: [birth year]   Age: 89 y.o.   MRN: [id number]   Chief Complaint Chief Complaint  Patient presents with  . office visit    Blood pressure been up for couple of weeks      History of Present Illness Mrs. Waldrip is a 89 yo female who comes in today for followup of her HTN.  On her last visit in 03/2023, I set her up for chronic care management of her HTN and they have noted on monitoring that her BP has been elevated.  I asked this past week to increase her lisinopril from 5mg  to 10mg  which she started 3 days ago.   Since her last visit, she states her BP has been running systollically in the 120-180's range.  She is currently on:  lisinopril 10mg  daily, Toprol XL 100mg  daily and hydralzine 50mg  BID.  She had an episode of presyncope where she was hospitalized in 05/2022.  They stated that given her advanced age and very favorable TTE,that they did not recommend strict blood pressure control.  She will have occasional LE edema where she will take lasix 20mg  as needed.  She denies any intolerance to her BP meds.  She denies any lightheadedness, dizziness, blurred vision, double vision, chest pain, palpitations, SOB, generalized weakness or edema.     Past Medical History Past Medical History:  Diagnosis Date  . Basal cell carcinoma 08/29/2019   sclerosis on right temple  . Chronic diastolic (congestive) heart failure (HCC)   . Closed dislocation of right shoulder 05/07/2019  . Closed fracture of superior ramus of pubis, initial encounter (HCC) 05/07/2019  . Gout   . Hypertension   . Hypothyroidism   . Retinal detachment    OD [facility]  . Shoulder dislocation, right, initial encounter 05/08/2019  . Squamous cell carcinoma of skin 08/29/2019   in situ on left buccal cheek  . Squamous cell carcinoma of skin 08/29/2019   in situ on left upper arm, posterior  . Squamous cell carcinoma of skin 08/29/2019   in situ on left  forearm, posterior     Allergies Allergies  Allergen Reactions  . Bactrim [Sulfamethoxazole-Trimethoprim] Shortness Of Breath, Nausea And Vomiting and Other (See Comments)    Headaches  . Sulfa Antibiotics Shortness Of Breath, Nausea And Vomiting and Other (See Comments)    Headaches      Medications  Current Outpatient Medications:  .  acetaminophen (TYLENOL) 500 MG tablet, Take 500 mg by mouth every 6 (six) hours as needed., Disp: , Rfl:  .  hydrALAZINE (APRESOLINE) 50 MG tablet, TAKE 1 TABLET EVERY MORNING AND TAKE 1 TABLET AT BEDTIME, Disp: 180 tablet, Rfl: 3 .  ibuprofen (ADVIL) 200 MG tablet, Take 200 mg by mouth daily as needed for moderate pain., Disp: , Rfl:  .  levothyroxine (SYNTHROID) 50 MCG tablet, Take 1 tablet (50 mcg total) by mouth daily., Disp: 30 tablet, Rfl: 11 .  lisinopril (ZESTRIL) 5 MG tablet, Take 1 tablet (5 mg total) by mouth daily., Disp: 90 tablet, Rfl: 3 .  magnesium oxide (MAG-OX) 400 MG tablet, Take 400 mg by mouth daily., Disp: , Rfl:  .  metoprolol succinate (TOPROL-XL) 100 MG 24 hr tablet, TAKE 1 TABLET EVERY EVENING AND HOLD FOR LOW BLOOD PRESSURE, Disp: 90 tablet, Rfl: 3   Review of Systems ROS     Objective:    Vitals BP 124/88 (BP Location: Left  Arm, Patient Position: Sitting, Cuff Size: Normal)   Pulse 67   Temp (!) 97.5 F (36.4 C)   Resp 18   Ht 5' (1.524 m)   Wt 114 lb 4 oz (51.8 kg)   SpO2 96%   BMI 22.31 kg/m    Physical Examination Physical Exam     Assessment & Plan:   No problem-specific Assessment & Plan notes found for this encounter.    No follow-ups on file.   Crist Fat, MD

## 2023-05-23 DIAGNOSIS — I5032 Chronic diastolic (congestive) heart failure: Secondary | ICD-10-CM | POA: Diagnosis not present

## 2023-05-23 DIAGNOSIS — I10 Essential (primary) hypertension: Secondary | ICD-10-CM | POA: Diagnosis not present

## 2023-06-16 ENCOUNTER — Ambulatory Visit: Payer: Medicare Other | Admitting: Internal Medicine

## 2023-06-16 ENCOUNTER — Encounter: Payer: Self-pay | Admitting: Internal Medicine

## 2023-06-16 ENCOUNTER — Other Ambulatory Visit: Payer: Self-pay | Admitting: Internal Medicine

## 2023-06-16 VITALS — BP 120/70 | HR 64 | Temp 97.3°F | Resp 18 | Ht 60.0 in | Wt 114.0 lb

## 2023-06-16 DIAGNOSIS — K5900 Constipation, unspecified: Secondary | ICD-10-CM | POA: Diagnosis not present

## 2023-06-16 DIAGNOSIS — I10 Essential (primary) hypertension: Secondary | ICD-10-CM | POA: Diagnosis not present

## 2023-06-16 DIAGNOSIS — E039 Hypothyroidism, unspecified: Secondary | ICD-10-CM | POA: Diagnosis not present

## 2023-06-16 MED ORDER — LISINOPRIL 20 MG PO TABS: 20.0000 mg | ORAL_TABLET | Freq: Every day | ORAL | 3 refills | Status: DC

## 2023-06-16 NOTE — Assessment & Plan Note (Signed)
 Her BP looks good today.  We will continue her current medications.

## 2023-06-16 NOTE — Assessment & Plan Note (Signed)
 I gave her hand written instructions to take benefiber daily and senna.  She is to drink plenty of fluids.  If she does not have a bowel movement in 2-3 days, she is to take a dose of miralax.

## 2023-06-16 NOTE — Assessment & Plan Note (Signed)
 I wonder if her constipation is related to her thyroid.  We will recheck her TFT's today and adjust her levothroxine as needed.

## 2023-06-16 NOTE — Progress Notes (Signed)
 Office Visit  Subjective   Patient ID: Rebekah Bush   DOB: 02-04-1930   Age: 89 y.o.   MRN: [id number]   Chief Complaint Chief Complaint  Patient presents with   Follow-up    3 month follow up     History of Present Illness Rebekah Bush is a 89 yo female who comes in today for followup of her HTN.  We did set her up for chronic care management last year for her HTN and they have noted on monitoring that her BP has been elevated.  Her BP was elevated and we did increase her lisinopril to 20mg  daily.  I talked to our nursing and looked at her remote numbers and her BP as run 90-150 systollically.   She is currently on:  lisinopril 20mg  daily, Toprol XL 100mg  daily and hydralzine 50mg  BID.  She had an episode of presyncope where she was hospitalized in 05/2022.  They stated that given her advanced age and very favorable TTE,that they did not recommend strict blood pressure control.  She will have occasional LE edema where she will take lasix 20mg  as needed.  She denies any intolerance to her BP meds.  She denies any headaches, lightheadedness, dizziness, blurred vision, double vision, chest pain, palpitations, SOB, generalized weakness or edema.   The patient also has a history of hypothyroidism which she states she has been taking medications for at least 20 years.  She states they discovered it on routine blood testing.  On her last visit, her TSH was high and her free T4 was normal.  Earlier in the year, her Free T4 was too high and I decreased her levothyroxine from daily to daily.  On her last visit, we changed her levothyroxine from daily to daily except on Sundays she takes .  Today, she denies any heat/cold intolerance, unexplained weight changes, tremors, anxiety, insomnia, dry skin, diarrhea, constipation or other problems.  She states that about 6 weeks ago she started noting abdominal distension and she has developed some constipation.  She wonders if it is  related to going on her dose of lisinopril.  She started with more fiber in her diet.  She was having problems with going several days without having a bowel movement.  She would have straining with bowel movements.       Past Medical History Past Medical History:  Diagnosis Date   Basal cell carcinoma 08/29/2019   sclerosis on right temple   Chronic diastolic (congestive) heart failure (HCC)    Closed dislocation of right shoulder 05/07/2019   Closed fracture of superior ramus of pubis, initial encounter (HCC) 05/07/2019   Gout    Hypertension    Hypothyroidism    Retinal detachment    OD [facility]   Shoulder dislocation, right, initial encounter 05/08/2019   Squamous cell carcinoma of skin 08/29/2019   in situ on left buccal cheek   Squamous cell carcinoma of skin 08/29/2019   in situ on left upper arm, posterior   Squamous cell carcinoma of skin 08/29/2019   in situ on left forearm, posterior     Allergies Allergies  Allergen Reactions   Bactrim [Sulfamethoxazole-Trimethoprim] Shortness Of Breath, Nausea And Vomiting and Other (See Comments)    Headaches   Sulfa Antibiotics Shortness Of Breath, Nausea And Vomiting and Other (See Comments)    Headaches      Medications  Current Outpatient Medications:    acetaminophen (TYLENOL) 500 MG tablet, Take  500 mg by mouth every 6 (six) hours as needed., Disp: , Rfl:    hydrALAZINE (APRESOLINE) 50 MG tablet, TAKE 1 TABLET EVERY MORNING AND TAKE 1 TABLET AT BEDTIME, Disp: 180 tablet, Rfl: 3   ibuprofen (ADVIL) 200 MG tablet, Take 200 mg by mouth daily as needed for moderate pain., Disp: , Rfl:    levothyroxine (SYNTHROID) 50 MCG tablet, Take 1 tablet (50 mcg total) by mouth daily., Disp: 30 tablet, Rfl: 11   magnesium oxide (MAG-OX) 400 MG tablet, Take 400 mg by mouth daily., Disp: , Rfl:    metoprolol succinate (TOPROL-XL) 100 MG 24 hr tablet, TAKE 1 TABLET EVERY EVENING AND HOLD FOR LOW BLOOD PRESSURE, Disp: 90 tablet,  Rfl: 3   Review of Systems Review of Systems  Constitutional:  Negative for chills and fever.  Eyes:  Negative for blurred vision and double vision.  Respiratory:  Negative for cough and shortness of breath.   Cardiovascular:  Negative for chest pain, palpitations and leg swelling.  Gastrointestinal:  Positive for constipation. Negative for abdominal pain, diarrhea, nausea and vomiting.  Genitourinary:  Negative for frequency.  Musculoskeletal:  Negative for myalgias.  Skin:  Negative for itching and rash.  Neurological:  Negative for dizziness, weakness and headaches.  Endo/Heme/Allergies:  Negative for polydipsia.       Objective:    Vitals BP 120/70 (BP Location: Left Arm, Patient Position: Sitting, Cuff Size: Normal)   Pulse 64   Temp (!) 97.3 F (36.3 C)   Resp 18   Ht 5' (1.524 m)   Wt 114 lb (51.7 kg)   SpO2 98%   BMI 22.26 kg/m    Physical Examination Physical Exam Constitutional:      Appearance: Normal appearance. She is not ill-appearing.  Cardiovascular:     Rate and Rhythm: Normal rate and regular rhythm.     Pulses: Normal pulses.     Heart sounds: No murmur heard.    No friction rub. No gallop.  Pulmonary:     Effort: Pulmonary effort is normal. No respiratory distress.     Breath sounds: No wheezing, rhonchi or rales.  Abdominal:     General: Bowel sounds are normal. There is no distension.     Palpations: Abdomen is soft.     Tenderness: There is no abdominal tenderness.  Musculoskeletal:     Right lower leg: No edema.     Left lower leg: No edema.  Skin:    General: Skin is warm and dry.     Findings: No rash.  Neurological:     General: No focal deficit present.     Mental Status: She is alert and oriented to person, place, and time.  Psychiatric:        Mood and Affect: Mood normal.        Behavior: Behavior normal.        Assessment & Plan:   Essential hypertension Her BP looks good today.  We will continue her current  medications.  Hypothyroidism I wonder if her constipation is related to her thyroid.  We will recheck her TFT's today and adjust her levothroxine as needed.  Constipation I gave her hand written instructions to take benefiber daily and senna.  She is to drink plenty of fluids.  If she does not have a bowel movement in 2-3 days, she is to take a dose of miralax.    Return in about 3 months (around 09/13/2023) for annual.   Crist Fat, MD

## 2023-06-17 LAB — T4, FREE: Free T4: 1.27 ng/dL (ref 0.82–1.77)

## 2023-06-17 LAB — TSH: TSH: 20.9 u[IU]/mL — ABNORMAL HIGH (ref 0.450–4.500)

## 2023-06-22 DIAGNOSIS — I10 Essential (primary) hypertension: Secondary | ICD-10-CM | POA: Diagnosis not present

## 2023-06-22 DIAGNOSIS — I5032 Chronic diastolic (congestive) heart failure: Secondary | ICD-10-CM | POA: Diagnosis not present

## 2023-06-28 ENCOUNTER — Other Ambulatory Visit: Payer: Self-pay

## 2023-06-28 DIAGNOSIS — E039 Hypothyroidism, unspecified: Secondary | ICD-10-CM

## 2023-06-28 MED ORDER — LEVOTHYROXINE SODIUM 50 MCG PO TABS: ORAL_TABLET | ORAL | 11 refills | Status: DC

## 2023-06-28 NOTE — Progress Notes (Signed)
 Rx

## 2023-06-28 NOTE — Progress Notes (Signed)
 Change her levothyroxine to po daily.   Patient aware of labs and increase in levothyroxine

## 2023-06-29 ENCOUNTER — Other Ambulatory Visit: Payer: Self-pay

## 2023-06-29 DIAGNOSIS — E039 Hypothyroidism, unspecified: Secondary | ICD-10-CM

## 2023-06-29 MED ORDER — LEVOTHYROXINE SODIUM 75 MCG PO TABS: 75.0000 ug | ORAL_TABLET | Freq: Every day | ORAL | 11 refills | Status: DC

## 2023-07-23 DIAGNOSIS — I5032 Chronic diastolic (congestive) heart failure: Secondary | ICD-10-CM | POA: Diagnosis not present

## 2023-07-23 DIAGNOSIS — I10 Essential (primary) hypertension: Secondary | ICD-10-CM | POA: Diagnosis not present

## 2023-08-22 DIAGNOSIS — I10 Essential (primary) hypertension: Secondary | ICD-10-CM

## 2023-08-22 DIAGNOSIS — I5032 Chronic diastolic (congestive) heart failure: Secondary | ICD-10-CM

## 2023-09-08 ENCOUNTER — Encounter: Payer: Medicare Other | Admitting: Internal Medicine

## 2023-09-15 ENCOUNTER — Encounter: Payer: Self-pay | Admitting: Internal Medicine

## 2023-09-15 ENCOUNTER — Other Ambulatory Visit: Payer: Self-pay | Admitting: Internal Medicine

## 2023-09-15 ENCOUNTER — Ambulatory Visit: Admitting: Internal Medicine

## 2023-09-15 VITALS — BP 118/72 | HR 64 | Temp 97.8°F | Resp 18 | Ht 60.0 in

## 2023-09-15 DIAGNOSIS — E039 Hypothyroidism, unspecified: Secondary | ICD-10-CM | POA: Diagnosis not present

## 2023-09-15 DIAGNOSIS — I5032 Chronic diastolic (congestive) heart failure: Secondary | ICD-10-CM

## 2023-09-15 DIAGNOSIS — Z1331 Encounter for screening for depression: Secondary | ICD-10-CM | POA: Diagnosis not present

## 2023-09-15 DIAGNOSIS — Z0000 Encounter for general adult medical examination without abnormal findings: Secondary | ICD-10-CM | POA: Diagnosis not present

## 2023-09-15 DIAGNOSIS — I10 Essential (primary) hypertension: Secondary | ICD-10-CM | POA: Diagnosis not present

## 2023-09-15 DIAGNOSIS — Z6822 Body mass index (BMI) 22.0-22.9, adult: Secondary | ICD-10-CM | POA: Insufficient documentation

## 2023-09-15 DIAGNOSIS — H353114 Nonexudative age-related macular degeneration, right eye, advanced atrophic with subfoveal involvement: Secondary | ICD-10-CM

## 2023-09-15 DIAGNOSIS — Z85828 Personal history of other malignant neoplasm of skin: Secondary | ICD-10-CM | POA: Insufficient documentation

## 2023-09-15 DIAGNOSIS — R739 Hyperglycemia, unspecified: Secondary | ICD-10-CM

## 2023-09-15 DIAGNOSIS — M1A0790 Idiopathic chronic gout, unspecified ankle and foot, without tophus (tophi): Secondary | ICD-10-CM

## 2023-09-15 DIAGNOSIS — M150 Primary generalized (osteo)arthritis: Secondary | ICD-10-CM | POA: Insufficient documentation

## 2023-09-15 NOTE — Assessment & Plan Note (Signed)
 Her BP is currently controlled.  We will continue her on her current medications.

## 2023-09-15 NOTE — Assessment & Plan Note (Signed)
 She is now on levothyroxine  75mcg po daily.  She seems euthryoid.  We will check her TFT's today.

## 2023-09-15 NOTE — Assessment & Plan Note (Signed)
 She does have some edema in her feet and legs.  I want her to take her lasix  daily for the next 3-4 days and use the elastic wraps or compression stockings to help.

## 2023-09-15 NOTE — Assessment & Plan Note (Signed)
 She has not had a gout attack in greater than 20 years.  We will continue to monitor.

## 2023-09-15 NOTE — Assessment & Plan Note (Signed)
 She will continue seeing her eye doctors with followup.

## 2023-09-15 NOTE — Assessment & Plan Note (Signed)
 She has osteoarthritis where she can take tylenol  or even ibuprofen as needed for any pain.  Continue with regular exercise with walking.

## 2023-09-15 NOTE — Assessment & Plan Note (Signed)
 I want her to continue to eat healthy and stay active.

## 2023-09-15 NOTE — Assessment & Plan Note (Signed)
She will followup with dermatology as directed.

## 2023-09-15 NOTE — Progress Notes (Signed)
 Preventive Screening-Counseling & Management     Rebekah Bush is a 89 y.o. female who presents for Medicare Annual/Subsequent preventive examination.  She moved into [facility] in 08/2022 where she was at another independent living here in [place] 3 months before that.  Her previous PCP was Alejandro Amour, NP.    Her last eye exam was 12/2022 where they are following her for macular degeneration.  She is followed by Dr. Seward Dao and she tells me he is doing injections but her vision still is deteriorating and worsened over the last 6 months.  Her last colonoscopy was done about 15 years ago and was normal.  She no longer does mammograms and quit doing these about 15 years ago.  She denies any problems with urination and denies any urinary incontinence or OAB.  The patient exercises by walking daily.  She has a history of tobacco abuse where she smoked 1/2 ppd for 30 years where she quit in 1990.  She denies any chronic cough, SOB, hemoptysis, wheezing or other problems.  She does get yearly flu vaccines.  She states she has had the prevnar 13 and pneumovax 23 vaccines in the past.  She has never had a shingles vaccine.  The patient has had 5 COVID-19 vaccines.  She had COVID-19 in 03/2023 and she has no long term sequalae from COVID.  The patient denies any depression, anxiety or memory loss.  She is not on an ASA.    Rebekah Bush is a 89 yo female who comes in today for followup of her hypertension.  We did set her up for chronic care management last year for her HTN and they have noted on monitoring that her BP has been elevated.  Her BP was elevated and we did increase her lisinopril  to 20mg  daily.  I talked to our nursing and looked at her remote numbers and her SBP as run 90-130 systollically.   She is currently on:  lisinopril  20mg  daily, Toprol  XL 100mg  daily and hydralzine 50mg  BID.  She had an episode of presyncope where she was hospitalized in 05/2022.  They stated that  given her advanced age and very favorable TTE,that they did not recommend strict blood pressure control.  She will have occasional LE edema where she will take lasix  20mg  as needed.  She denies any intolerance to her BP meds.  She denies any headaches, lightheadedness, dizziness, blurred vision, double vision, chest pain, palpitations, SOB, generalized weakness or edema.    The patient also has a history of hypothyroidism which she states she has been taking medications for at least 20 years.  She states they discovered it on routine blood testing.  This past year, her TSH was high and her free T4 was normal.  Earlier in the year, her Free T4 was too high and I decreased her levothyroxine  from 75mcg daily to 50mcg daily.  She is now taking levothyroxine  to 75mcg po daily.  Today, she denies any heat/cold intolerance, unexplained weight changes, tremors, anxiety, insomnia, dry skin, diarrhea, constipation or other problems.   We did see her several months ago where she had some abdominal distension and constipation.  She was having problems with going several days without having a bowel movement.  She would have straining with bowel movements.  I asked her to start daily benefiber and senna and drink plenty of fluids and use miralax  as needed.  Since starting on this regimen, she states this has helped greatly.  The patient also has a history of chronic diastolic congestive heart failure.  They noted during her hospitalization in 05/2022 that she appeared volume depleted and gave her IVF's.  They performed an ECHO in 05/2022 and this demonstrated a normal LVEF 60-65% without wall motion abnormalities and only grade 1 DD w/ no significant valvular abnormalities.  She was having some DOE at that time but they felt this was simply age related deconditioning.  Again, she uses lasix  for peripheral edema prn but she has not had to take this in a while.  She has noted over the last few weeks she has had some worsening  lower extremity edema and has taken a few doses of lasix .  She has tried using compression hose but she cannot get these on.  She states she has noted some elastic wraps and she is going to order these.  Otherwise, chest pain, palpitations, SOB, and no orthopnea.    The patient also has a history of osteoarthritis.  She did see rheumatology in 07/2020 with complaints of pain in her bilateral hands.  She was also having right hip pain and on workup, her labs were negative for any specific serology related to a previous positive ANA.  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  She says symptoms are about the same still having pain in both hands and bothersome triggering and catching with fourth and fifth digit of her right hand.  She had surgery for trigger finger in the past.  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  She was using NSAIDs with reasonable control of her symptoms which is also appropriate for this problem.  Again, she had a positive ANA but no clinical criteria for systemic lupus was seen and more specific antibody profile is all negative.  Based on this, rheumatology did not suspect a systemic connective tissue disease and no additional work-up recommended.  She also tells me that she has had gout in the past that has effected her feet/toes.  Her last gout attack was >20 years ago.  The patient also sees dermatology regular where she has had mutiple squamous cell carcinomas removed from her body.  She had MOHS surgery to the top of her scalp and she is followed closely by dermatology.      Are there smokers in your home (other than you)? No  Risk Factors Current exercise habits: as above  Dietary issues discussed: none   Depression Screen (Note: if answer to either of the  following is "Yes", a more complete depression screening is indicated)   Over the past two weeks, have you felt down, depressed or hopeless? No  Over the past two weeks, have you felt little interest or pleasure in doing things? No  Have you lost interest or pleasure in daily life? No  Do you often feel hopeless? No  Do you cry easily over simple problems? No  Activities of Daily Living In your present state of health, do you have any difficulty performing the following activities?:  Driving? Yes Managing money?  Yes Feeding yourself? No Getting from bed to chair? No Climbing a flight of stairs? No Preparing food and eating?: No Bathing or showering? No Getting dressed: No Getting to the toilet? No Using the toilet:No Moving around from place to place: No In the past year have  you fallen or had a near fall?:No   Are you sexually active?  No  Do you have more than one partner?  No  Hearing Difficulties: No Do you often ask people to speak up or repeat themselves? No Do you experience ringing or noises in your ears? No Do you have difficulty understanding soft or whispered voices? No   Do you feel that you have a problem with memory? No  Do you often misplace items? No  Do you feel safe at home?  Yes  Cognitive Testing  Alert? Yes  Normal Appearance?Yes  Oriented to person? Yes  Place? Yes   Time? Yes  Recall of three objects?  Yes  Can perform simple calculations? Yes  Displays appropriate judgment?Yes  Can read the correct time from a watch face?Yes  Fall Risk Prevention  Any stairs in or around the home? Yes  If so, are there any without handrails? Yes  Home free of loose throw rugs in walkways, pet beds, electrical cords, etc? Yes  Adequate lighting in your home to reduce risk of falls? Yes  Use of a cane, walker or w/c? Yes - uses a rolling walker   Time Up and Go  Was the test performed? No .      Advanced Directives have been discussed with the patient?  Yes   List the Names of Other Physician/Practitioners you currently use: Patient Care Team: Wayne Haines, MD as PCP - General (Internal Medicine) Aris Kuster, Bridgette Campus, PA-C (Inactive) (Dermatology) Candyce Champagne, MD as Consulting Physician (General Surgery)    Past Medical History:  Diagnosis Date   Basal cell carcinoma 08/29/2019   sclerosis on right temple   Chronic diastolic (congestive) heart failure (HCC)    Closed dislocation of right shoulder 05/07/2019   Closed fracture of superior ramus of pubis, initial encounter (HCC) 05/07/2019   Gout    Hypertension    Hypothyroidism    Retinal detachment    OD [facility]   Shoulder dislocation, right, initial encounter 05/08/2019   Squamous cell carcinoma of skin 08/29/2019   in situ on left buccal cheek   Squamous cell carcinoma of skin 08/29/2019   in situ on left upper arm, posterior   Squamous cell carcinoma of skin 08/29/2019   in situ on left forearm, posterior    Past Surgical History:  Procedure Laterality Date   CATARACT EXTRACTION Right    [facility] - 1980s    CATARACT EXTRACTION Left    Gennie Kicks - 1980s   CHOLECYSTECTOMY     INGUINAL HERNIA REPAIR Right 01/28/2021   Procedure: LAPAROSCOPIC FEMORAL  HERNIA REPAIR WITH MESH,TRANSVERSE ABDOMINUS PLANE (TAP)BLOCK;  Surgeon: Candyce Champagne, MD;  Location: [facility];  Service: General;  Laterality: Right;   INGUINAL HERNIA REPAIR Bilateral 01/28/2021   Procedure: LAPAROSCOPIC BILATERAL INGUINAL HERNIA REPAIR, LAPAROSCOPIC RETROPSOAS HERNIA REPAIR WITH MESH;  Surgeon: Candyce Champagne, MD;  Location: [facility];  Service: General;  Laterality: Bilateral;   RETINAL DETACHMENT SURGERY        Current Medications  Current Outpatient Medications  Medication Sig Dispense Refill   levothyroxine  (SYNTHROID ) 75 MCG tablet Take 1 tablet (75 mcg total) by mouth daily. 30 tablet 11   acetaminophen  (TYLENOL ) 500 MG tablet Take 500 mg by mouth every 6 (six) hours as needed.      hydrALAZINE  (APRESOLINE ) 50 MG tablet TAKE 1 TABLET EVERY MORNING AND TAKE 1 TABLET AT BEDTIME 180 tablet 3   ibuprofen (ADVIL) 200 MG tablet Take 200 mg  by mouth daily as needed for moderate pain.     lisinopril  (ZESTRIL ) 20 MG tablet Take 1 tablet (20 mg total) by mouth daily. 90 tablet 3   magnesium  oxide (MAG-OX) 400 MG tablet Take 400 mg by mouth daily.     metoprolol  succinate (TOPROL -XL) 100 MG 24 hr tablet TAKE 1 TABLET EVERY EVENING AND HOLD FOR LOW BLOOD PRESSURE 90 tablet 3   No current facility-administered medications for this visit.    Allergies Bactrim [sulfamethoxazole-trimethoprim] and Sulfa antibiotics   Social History Social History   Tobacco Use   Smoking status: Former    Current packs/day: 0.00    Types: Cigarettes    Quit date: 1992    Years since quitting: 33.4   Smokeless tobacco: Never  Substance Use Topics   Alcohol use: Yes    Comment: Socially     Review of Systems Review of Systems  Constitutional:  Negative for chills, fever, malaise/fatigue and weight loss.  Eyes:  Positive for blurred vision. Negative for double vision.  Respiratory:  Negative for cough, hemoptysis, shortness of breath and wheezing.   Cardiovascular:  Negative for chest pain, palpitations, orthopnea and leg swelling.  Gastrointestinal:  Negative for abdominal pain, blood in stool, constipation, diarrhea, heartburn, melena, nausea and vomiting.  Genitourinary:  Negative for frequency and hematuria.  Musculoskeletal:  Positive for joint pain. Negative for back pain, myalgias and neck pain.  Skin:  Negative for itching and rash.  Neurological:  Negative for dizziness, weakness and headaches.  Endo/Heme/Allergies:  Negative for polydipsia.  Psychiatric/Behavioral:  Negative for depression and memory loss. The patient is not nervous/anxious and does not have insomnia.      Physical Exam:      Body mass index is 22.26 kg/m. BP 118/72   Pulse 64   Temp 97.8 F (36.6 C)    Resp 18   Ht 5' (1.524 m)   SpO2 93%   BMI 22.26 kg/m   Physical Exam Constitutional:      Appearance: Normal appearance. She is not ill-appearing.  HENT:     Head: Normocephalic and atraumatic.     Right Ear: Tympanic membrane, ear canal and external ear normal.     Left Ear: Tympanic membrane, ear canal and external ear normal.     Nose: Nose normal. No congestion or rhinorrhea.     Mouth/Throat:     Mouth: Mucous membranes are moist.     Pharynx: Oropharynx is clear. No posterior oropharyngeal erythema.  Eyes:     General: No scleral icterus.    Conjunctiva/sclera: Conjunctivae normal.     Pupils: Pupils are equal, round, and reactive to light.  Neck:     Thyroid : No thyromegaly.     Vascular: No carotid bruit.  Cardiovascular:     Rate and Rhythm: Normal rate and regular rhythm.     Pulses: Normal pulses.     Heart sounds: Normal heart sounds. No murmur heard.    No friction rub. No gallop.  Pulmonary:     Effort: Pulmonary effort is normal. No respiratory distress.     Breath sounds: Normal breath sounds. No wheezing, rhonchi or rales.  Abdominal:     General: Abdomen is flat. Bowel sounds are normal. There is no distension.     Palpations: Abdomen is soft.     Tenderness: There is no abdominal tenderness.  Musculoskeletal:     Cervical back: Normal range of motion. No tenderness.     Right lower leg: Edema  present.     Left lower leg: Edema present.     Comments: No clubbing or cyanosis, she has 1+ edema to her bilateral feet/legs up to mid calf  Lymphadenopathy:     Cervical: No cervical adenopathy.  Skin:    General: Skin is warm and dry.     Findings: No rash.  Neurological:     General: No focal deficit present.     Mental Status: She is alert and oriented to person, place, and time.     Comments: CN II-XII grossly intact  Psychiatric:        Mood and Affect: Mood normal.        Behavior: Behavior normal.      Assessment:      Essential  hypertension  Chronic diastolic CHF (congestive heart failure) (HCC)  Acquired hypothyroidism  Hypothyroidism, unspecified type  Primary osteoarthritis involving multiple joints  Chronic gout of foot, unspecified cause, unspecified laterality  History of squamous cell carcinoma of skin  BMI 22.0-22.9, adult  Advanced nonexudative age-related macular degeneration of right eye with subfoveal involvement    Plan:     During the course of the visit the patient was educated and counseled about appropriate screening and preventive services including:   Pneumococcal vaccine  Influenza vaccine Colorectal cancer screening Advanced directives: discussed  Diet review for nutrition referral? Yes ____  Not Indicated __X__   Patient Instructions (the written plan) was given to the patient.  Chronic diastolic CHF (congestive heart failure) (HCC) She does have some edema in her feet and legs.  I want her to take her lasix  daily for the next 3-4 days and use the elastic wraps or compression stockings to help.  Essential hypertension Her BP is currently controlled.  We will continue her on her current medications.  Acquired hypothyroidism She is now on levothyroxine  75mcg po daily.  She seems euthryoid.  We will check her TFT's today.  Primary osteoarthritis involving multiple joints She has osteoarthritis where she can take tylenol  or even ibuprofen as needed for any pain.  Continue with regular exercise with walking.  Advanced nonexudative age-related macular degeneration of right eye with subfoveal involvement She will continue seeing her eye doctors with followup.  BMI 22.0-22.9, adult I want her to continue to eat healthy and stay active.  Gout She has not had a gout attack in greater than 20 years.  We will continue to monitor.  History of squamous cell carcinoma of skin She will followup with dermatology as directed.   Prevention Health maintenance discussed.  We will  obtain some yearly labs.   Medicare Attestation I have personally reviewed: The patient's medical and social history Their use of alcohol, tobacco or illicit drugs Their current medications and supplements The patient's functional ability including ADLs,fall risks, home safety risks, cognitive, and hearing and visual impairment Diet and physical activities Evidence for depression or mood disorders  The patient's weight, height, and BMI have been recorded in the chart.  I have made referrals, counseling, and provided education to the patient based on review of the above and I have provided the patient with a written personalized care plan for preventive services.     Wayne Haines, MD   09/15/2023

## 2023-09-16 LAB — COMPREHENSIVE METABOLIC PANEL WITH GFR
ALT: 12 IU/L (ref 0–32)
AST: 23 IU/L (ref 0–40)
Albumin: 4.4 g/dL (ref 3.6–4.6)
Alkaline Phosphatase: 117 IU/L (ref 44–121)
BUN/Creatinine Ratio: 28 (ref 12–28)
BUN: 19 mg/dL (ref 10–36)
Bilirubin Total: 0.6 mg/dL (ref 0.0–1.2)
CO2: 20 mmol/L (ref 20–29)
Calcium: 10 mg/dL (ref 8.7–10.3)
Chloride: 96 mmol/L (ref 96–106)
Creatinine, Ser: 0.69 mg/dL (ref 0.57–1.00)
Globulin, Total: 2.9 g/dL (ref 1.5–4.5)
Glucose: 86 mg/dL (ref 70–99)
Potassium: 4.2 mmol/L (ref 3.5–5.2)
Sodium: 138 mmol/L (ref 134–144)
Total Protein: 7.3 g/dL (ref 6.0–8.5)
eGFR: 82 mL/min/{1.73_m2} (ref >59)

## 2023-09-16 LAB — CBC WITH DIFFERENTIAL/PLATELET
Basophils Absolute: 0.1 10*3/uL (ref 0.0–0.2)
Basos: 1 %
EOS (ABSOLUTE): 0.2 10*3/uL (ref 0.0–0.4)
Eos: 2 %
Hematocrit: 47.1 % — ABNORMAL HIGH (ref 34.0–46.6)
Hemoglobin: 15.5 g/dL (ref 11.1–15.9)
Immature Grans (Abs): 0 10*3/uL (ref 0.0–0.1)
Immature Granulocytes: 0 %
Lymphocytes Absolute: 1.1 10*3/uL (ref 0.7–3.1)
Lymphs: 14 %
MCH: 30 pg (ref 26.6–33.0)
MCHC: 32.9 g/dL (ref 31.5–35.7)
MCV: 91 fL (ref 79–97)
Monocytes Absolute: 1 10*3/uL — ABNORMAL HIGH (ref 0.1–0.9)
Monocytes: 13 %
Neutrophils Absolute: 5.7 10*3/uL (ref 1.4–7.0)
Neutrophils: 70 %
Platelets: 303 10*3/uL (ref 150–450)
RBC: 5.17 x10E6/uL (ref 3.77–5.28)
RDW: 13.2 % (ref 11.7–15.4)
WBC: 8.2 10*3/uL (ref 3.4–10.8)

## 2023-09-16 LAB — LIPID PANEL W/O CHOL/HDL RATIO
Cholesterol, Total: 201 mg/dL — ABNORMAL HIGH (ref 100–199)
HDL: 87 mg/dL (ref >39)
LDL Chol Calc (NIH): 98 mg/dL (ref 0–99)
Triglycerides: 93 mg/dL (ref 0–149)
VLDL Cholesterol Cal: 16 mg/dL (ref 5–40)

## 2023-09-16 LAB — TSH: TSH: 1.38 u[IU]/mL (ref 0.450–4.500)

## 2023-09-16 LAB — T4, FREE: Free T4: 1.74 ng/dL (ref 0.82–1.77)

## 2023-09-16 LAB — HGB A1C W/O EAG: Hgb A1c MFr Bld: 5.5 % (ref 4.8–5.6)

## 2023-09-20 ENCOUNTER — Ambulatory Visit: Payer: Self-pay

## 2023-09-20 NOTE — Progress Notes (Signed)
 Patient called.  Patient aware.  I have called and informed the patient  "Her labs look good.".  Pt aware.

## 2023-09-21 DIAGNOSIS — I10 Essential (primary) hypertension: Secondary | ICD-10-CM | POA: Diagnosis not present

## 2023-09-21 DIAGNOSIS — I5032 Chronic diastolic (congestive) heart failure: Secondary | ICD-10-CM | POA: Diagnosis not present

## 2023-10-05 ENCOUNTER — Other Ambulatory Visit: Payer: Self-pay

## 2023-10-05 MED ORDER — LEVOTHYROXINE SODIUM 75 MCG PO TABS: 75.0000 ug | ORAL_TABLET | Freq: Every day | ORAL | 11 refills | Status: DC

## 2023-10-20 ENCOUNTER — Other Ambulatory Visit: Payer: Self-pay | Admitting: Internal Medicine

## 2023-10-20 DIAGNOSIS — E039 Hypothyroidism, unspecified: Secondary | ICD-10-CM

## 2023-10-21 DIAGNOSIS — I10 Essential (primary) hypertension: Secondary | ICD-10-CM | POA: Diagnosis not present

## 2023-10-21 DIAGNOSIS — I5032 Chronic diastolic (congestive) heart failure: Secondary | ICD-10-CM | POA: Diagnosis not present

## 2023-11-10 ENCOUNTER — Ambulatory Visit: Admitting: Internal Medicine

## 2023-11-10 ENCOUNTER — Encounter: Payer: Self-pay | Admitting: Internal Medicine

## 2023-11-10 VITALS — BP 124/78 | HR 73 | Temp 97.0°F | Resp 18 | Ht 60.0 in | Wt 107.5 lb

## 2023-11-10 DIAGNOSIS — I10 Essential (primary) hypertension: Secondary | ICD-10-CM

## 2023-11-10 NOTE — Assessment & Plan Note (Signed)
 She is now taking Toprol  XL in AM and waits until early afternoon to take her lisinopril .  She remains on hydralazine  BID.  Her BP looks good today.  I am not sure what caused it to jump up like that but it has been doing well since.  We will continue with remote monitoring of her BP.

## 2023-11-10 NOTE — Progress Notes (Signed)
 Office Visit  Subjective   Patient ID: Rebekah Bush   DOB: 01-Jun-1929   Age: 89 y.o.   MRN: [id number]   Chief Complaint Chief Complaint  Patient presents with   Follow-up    Follow up bp high at home Saturday      History of Present Illness Rebekah Bush is a 89 yo female who comes in today for followup of her hypertension.   She is in our chronic medical management for hypertension where over this past week she was noted to have a SBP >200.  My nurses state her remote BP when she had an acute problem with very high elevated BP.  My nurse talked to her on the phone and the patient was asymptomatic.  She had taken her Toprol  in AM and took her lisinopril  around 1 PM that day and her BP came back down.  This period of elevated BP lasted about one day and my nurse states that since then, her BP's have been doing with us  remotely viewing them.   We did set her up for chronic care management last year for her HTN.  She is currently on:  lisinopril  20mg  daily, Toprol  XL 100mg  daily and hydralzine 50mg  BID.  She had an episode of presyncope where she was hospitalized in 05/2022.  They stated that given her advanced age and very favorable TTE,that they did not recommend strict blood pressure control.  She will have occasional LE edema where she will take lasix  20mg  as needed.  She denies any intolerance to her BP meds.  She denies any headaches, lightheadedness, dizziness, blurred vision, double vision, chest pain, palpitations, SOB, generalized weakness or edema.      Past Medical History Past Medical History:  Diagnosis Date   Basal cell carcinoma 08/29/2019   sclerosis on right temple   Chronic diastolic (congestive) heart failure (HCC)    Closed dislocation of right shoulder 05/07/2019   Closed fracture of superior ramus of pubis, initial encounter (HCC) 05/07/2019   Gout    Hypertension    Hypothyroidism    Retinal detachment    OD [facility]   Shoulder dislocation, right, initial  encounter 05/08/2019   Squamous cell carcinoma of skin 08/29/2019   in situ on left buccal cheek   Squamous cell carcinoma of skin 08/29/2019   in situ on left upper arm, posterior   Squamous cell carcinoma of skin 08/29/2019   in situ on left forearm, posterior     Allergies Allergies  Allergen Reactions   Bactrim [Sulfamethoxazole-Trimethoprim] Shortness Of Breath, Nausea And Vomiting and Other (See Comments)    Headaches   Sulfa Antibiotics Shortness Of Breath, Nausea And Vomiting and Other (See Comments)    Headaches      Medications  Current Outpatient Medications:    acetaminophen  (TYLENOL ) 500 MG tablet, Take 500 mg by mouth every 6 (six) hours as needed., Disp: , Rfl:    hydrALAZINE  (APRESOLINE ) 50 MG tablet, TAKE 1 TABLET EVERY MORNING AND TAKE 1 TABLET AT BEDTIME, Disp: 180 tablet, Rfl: 3   ibuprofen (ADVIL) 200 MG tablet, Take 200 mg by mouth daily as needed for moderate pain., Disp: , Rfl:    levothyroxine  (SYNTHROID ) 50 MCG tablet, TAKE 1 TABLET EVERY DAY (Patient not taking: Reported on 11/10/2023), Disp: 90 tablet, Rfl: 3   levothyroxine  (SYNTHROID ) 75 MCG tablet, Take 1 tablet (75 mcg total) by mouth daily., Disp: 30 tablet, Rfl: 11   lisinopril  (ZESTRIL ) 20 MG tablet, Take  1 tablet (20 mg total) by mouth daily., Disp: 90 tablet, Rfl: 3   magnesium  oxide (MAG-OX) 400 MG tablet, Take 400 mg by mouth daily., Disp: , Rfl:    metoprolol  succinate (TOPROL -XL) 100 MG 24 hr tablet, TAKE 1 TABLET EVERY EVENING AND HOLD FOR LOW BLOOD PRESSURE, Disp: 90 tablet, Rfl: 3   Review of Systems Review of Systems  Eyes:  Negative for blurred vision and double vision.  Respiratory:  Negative for cough and shortness of breath.   Cardiovascular:  Negative for chest pain, palpitations and leg swelling.  Gastrointestinal:  Negative for abdominal pain, constipation, diarrhea, heartburn, nausea and vomiting.  Musculoskeletal:  Negative for myalgias.  Neurological:  Negative for dizziness,  weakness and headaches.       Objective:    Vitals BP 124/78   Pulse 73   Temp (!) 97 F (36.1 C)   Resp 18   Ht 5' (1.524 m)   Wt 107 lb 8 oz (48.8 kg)   SpO2 98%   BMI 20.99 kg/m    Physical Examination Physical Exam Constitutional:      Appearance: Normal appearance. She is not ill-appearing.  Cardiovascular:     Rate and Rhythm: Normal rate and regular rhythm.     Pulses: Normal pulses.     Heart sounds: No murmur heard.    No friction rub. No gallop.  Pulmonary:     Effort: Pulmonary effort is normal. No respiratory distress.     Breath sounds: No wheezing, rhonchi or rales.  Abdominal:     General: Bowel sounds are normal. There is no distension.     Palpations: Abdomen is soft.     Tenderness: There is no abdominal tenderness.  Musculoskeletal:     Right lower leg: No edema.     Left lower leg: No edema.  Skin:    General: Skin is warm and dry.     Findings: No rash.  Neurological:     Mental Status: She is alert.        Assessment & Plan:   Essential hypertension She is now taking Toprol  XL in AM and waits until early afternoon to take her lisinopril .  She remains on hydralazine  BID.  Her BP looks good today.  I am not sure what caused it to jump up like that but it has been doing well since.  We will continue with remote monitoring of her BP.    No follow-ups on file.   Selinda Fleeta Finger, MD

## 2023-11-20 ENCOUNTER — Other Ambulatory Visit: Payer: Self-pay

## 2023-11-20 ENCOUNTER — Emergency Department (HOSPITAL_COMMUNITY)

## 2023-11-20 ENCOUNTER — Encounter (HOSPITAL_COMMUNITY): Payer: Self-pay | Admitting: Emergency Medicine

## 2023-11-20 ENCOUNTER — Emergency Department (HOSPITAL_COMMUNITY)
Admission: EM | Admit: 2023-11-20 | Discharge: 2023-11-20 | Disposition: A | Discharge status: Home / Self Care | Attending: Student | Admitting: Student

## 2023-11-20 DIAGNOSIS — Z87891 Personal history of nicotine dependence: Secondary | ICD-10-CM | POA: Diagnosis not present

## 2023-11-20 DIAGNOSIS — M545 Low back pain, unspecified: Secondary | ICD-10-CM | POA: Diagnosis not present

## 2023-11-20 DIAGNOSIS — E039 Hypothyroidism, unspecified: Secondary | ICD-10-CM | POA: Insufficient documentation

## 2023-11-20 DIAGNOSIS — I5032 Chronic diastolic (congestive) heart failure: Secondary | ICD-10-CM | POA: Diagnosis not present

## 2023-11-20 DIAGNOSIS — M549 Dorsalgia, unspecified: Secondary | ICD-10-CM | POA: Diagnosis present

## 2023-11-20 DIAGNOSIS — R109 Unspecified abdominal pain: Secondary | ICD-10-CM | POA: Insufficient documentation

## 2023-11-20 DIAGNOSIS — I110 Hypertensive heart disease with heart failure: Secondary | ICD-10-CM | POA: Diagnosis not present

## 2023-11-20 LAB — URINALYSIS, ROUTINE W REFLEX MICROSCOPIC
Bacteria, UA: NONE SEEN
Bilirubin Urine: NEGATIVE
Glucose, UA: NEGATIVE mg/dL
Hgb urine dipstick: NEGATIVE
Ketones, ur: 5 mg/dL — AB
Leukocytes,Ua: NEGATIVE
Nitrite: NEGATIVE
Protein, ur: 30 mg/dL — AB
Specific Gravity, Urine: 1.008 (ref 1.005–1.030)
pH: 8 (ref 5.0–8.0)

## 2023-11-20 LAB — COMPREHENSIVE METABOLIC PANEL WITH GFR
ALT: 19 U/L (ref 0–44)
AST: 26 U/L (ref 15–41)
Albumin: 4.5 g/dL (ref 3.5–5.0)
Alkaline Phosphatase: 110 U/L (ref 38–126)
Anion gap: 14 (ref 5–15)
BUN: 16 mg/dL (ref 8–23)
CO2: 26 mmol/L (ref 22–32)
Calcium: 9.9 mg/dL (ref 8.9–10.3)
Chloride: 92 mmol/L — ABNORMAL LOW (ref 98–111)
Creatinine, Ser: 0.6 mg/dL (ref 0.44–1.00)
GFR, Estimated: >60 mL/min (ref >60)
Glucose, Bld: 108 mg/dL — ABNORMAL HIGH (ref 70–99)
Potassium: 3.6 mmol/L (ref 3.5–5.1)
Sodium: 132 mmol/L — ABNORMAL LOW (ref 135–145)
Total Bilirubin: 1 mg/dL (ref 0.0–1.2)
Total Protein: 7.6 g/dL (ref 6.5–8.1)

## 2023-11-20 LAB — CBC WITH DIFFERENTIAL/PLATELET
Abs Immature Granulocytes: 0.04 K/uL (ref 0.00–0.07)
Basophils Absolute: 0.1 K/uL (ref 0.0–0.1)
Basophils Relative: 1 %
Eosinophils Absolute: 0.1 K/uL (ref 0.0–0.5)
Eosinophils Relative: 1 %
HCT: 50 % — ABNORMAL HIGH (ref 36.0–46.0)
Hemoglobin: 16 g/dL — ABNORMAL HIGH (ref 12.0–15.0)
Immature Granulocytes: 0 %
Lymphocytes Relative: 7 %
Lymphs Abs: 0.7 K/uL (ref 0.7–4.0)
MCH: 28.5 pg (ref 26.0–34.0)
MCHC: 32 g/dL (ref 30.0–36.0)
MCV: 89 fL (ref 80.0–100.0)
Monocytes Absolute: 1.1 K/uL — ABNORMAL HIGH (ref 0.1–1.0)
Monocytes Relative: 11 %
Neutro Abs: 7.6 K/uL (ref 1.7–7.7)
Neutrophils Relative %: 80 %
Platelets: 282 K/uL (ref 150–400)
RBC: 5.62 MIL/uL — ABNORMAL HIGH (ref 3.87–5.11)
RDW: 14.2 % (ref 11.5–15.5)
WBC: 9.6 K/uL (ref 4.0–10.5)
nRBC: 0 % (ref 0.0–0.2)

## 2023-11-20 MED ORDER — MORPHINE SULFATE (PF) 2 MG/ML IV SOLN
2.0000 mg | Freq: Once | INTRAVENOUS | Status: AC
  Administered 2023-11-20: 2 mg via INTRAVENOUS
  Filled 2023-11-20: qty 1

## 2023-11-20 MED ORDER — ONDANSETRON HCL 4 MG/2ML IJ SOLN
4.0000 mg | Freq: Once | INTRAMUSCULAR | Status: AC
  Administered 2023-11-20: 4 mg via INTRAVENOUS
  Filled 2023-11-20: qty 2

## 2023-11-20 MED ORDER — HYDRALAZINE HCL 25 MG PO TABS
50.0000 mg | ORAL_TABLET | Freq: Once | ORAL | Status: AC
  Administered 2023-11-20: 50 mg via ORAL
  Filled 2023-11-20: qty 2

## 2023-11-20 MED ORDER — LISINOPRIL 10 MG PO TABS
20.0000 mg | ORAL_TABLET | Freq: Every day | ORAL | Status: DC
  Administered 2023-11-20: 20 mg via ORAL
  Filled 2023-11-20: qty 2

## 2023-11-20 MED ORDER — NAPROXEN 375 MG PO TABS: 375.0000 mg | ORAL_TABLET | Freq: Two times a day (BID) | ORAL | 0 refills | Status: DC

## 2023-11-20 MED ORDER — LIDOCAINE 5 % EX PTCH
1.0000 | MEDICATED_PATCH | CUTANEOUS | 0 refills | Status: DC
Start: 2023-11-20 — End: 2024-01-03

## 2023-11-20 NOTE — ED Triage Notes (Signed)
 Pt from [facility], c/o right hip pain that began at 1800 last night pt reports pain progressed to right flank during the night. Does endorse having frequent urination but no pain, no hx kidney stones, c/o nausea PTA.

## 2023-11-20 NOTE — ED Provider Notes (Signed)
 [place] EMERGENCY DEPARTMENT AT [facility] Provider Note  CSN: [id number] Arrival date & time: 11/20/23 0230  Chief Complaint(s) Flank Pain  HPI Rebekah Bush is a 89 y.o. female with PMH HTN, CHF, hypothyroidism, osteoporosis who presents emerged part for evaluation of back and flank pain.  States that symptoms began at 1800 on 11/19/2023 in the right lumbar spine.  Endorses frequent urination but is denying dysuria.  Endorses nausea but denies chest pain, shortness of breath, headache, fever or other systemic symptoms.  Denies any recent fall or trauma to the back.   Past Medical History Past Medical History:  Diagnosis Date   Basal cell carcinoma 08/29/2019   sclerosis on right temple   Chronic diastolic (congestive) heart failure (HCC)    Closed dislocation of right shoulder 05/07/2019   Closed fracture of superior ramus of pubis, initial encounter (HCC) 05/07/2019   Gout    Hypertension    Hypothyroidism    Retinal detachment    OD [facility]   Shoulder dislocation, right, initial encounter 05/08/2019   Squamous cell carcinoma of skin 08/29/2019   in situ on left buccal cheek   Squamous cell carcinoma of skin 08/29/2019   in situ on left upper arm, posterior   Squamous cell carcinoma of skin 08/29/2019   in situ on left forearm, posterior   Patient Active Problem List   Diagnosis Date Noted   Primary osteoarthritis involving multiple joints 09/15/2023   History of squamous cell carcinoma of skin 09/15/2023   BMI 22.0-22.9, adult 09/15/2023   Weight loss 03/03/2023   Dehydration 06/02/2022   Essential hypertension 06/02/2022   Chronic diastolic CHF (congestive heart failure) (HCC) 06/02/2022   Malnutrition of moderate degree 01/30/2021   Incarcerated femoral hernia with SBO s/p lap repair 01/28/2021 01/28/2021   Gout 01/28/2021   History of fracture due to fall 01/28/2021   SBO (small bowel obstruction) (HCC) 01/28/2021   Arthritis of hand  01/01/2021   Generalized osteoarthritis 07/30/2020   Positive ANA (antinuclear antibody) 07/30/2020   Trigger finger, right ring finger 07/30/2020   Pain in right hip 07/30/2020   Rash and other nonspecific skin eruption 07/30/2020   Advanced nonexudative age-related macular degeneration of right eye with subfoveal involvement 08/15/2019   Intermediate stage nonexudative age-related macular degeneration of left eye 08/01/2019   Right epiretinal membrane 08/01/2019   Acquired hypothyroidism 05/07/2019   QT prolongation 05/07/2019   Home Medication(s) Prior to Admission medications   Medication Sig Start Date End Date Taking? Authorizing Provider  lidocaine  (LIDODERM ) 5 % Place 1 patch onto the skin daily. Remove & Discard patch within 12 hours or as directed by MD 11/20/23  Yes Kimberlea Schlag, MD  naproxen  (NAPROSYN ) 375 MG tablet Take 1 tablet (375 mg total) by mouth 2 (two) times daily. 11/20/23  Yes Adahlia Stembridge, MD  acetaminophen  (TYLENOL ) 500 MG tablet Take 500 mg by mouth every 6 (six) hours as needed.    [provider]  hydrALAZINE  (APRESOLINE ) 50 MG tablet TAKE 1 TABLET EVERY MORNING AND TAKE 1 TABLET AT BEDTIME 03/21/23   Fleeta Finger, Selinda, MD  ibuprofen (ADVIL) 200 MG tablet Take 200 mg by mouth daily as needed for moderate pain.    [provider]  levothyroxine  (SYNTHROID ) 50 MCG tablet TAKE 1 TABLET EVERY DAY Patient not taking: Reported on 11/10/2023 10/20/23   Fleeta Finger Selinda, MD  levothyroxine  (SYNTHROID ) 75 MCG tablet Take 1 tablet (75 mcg total) by mouth daily. 10/05/23 10/04/24  Fleeta Finger, Selinda, MD  lisinopril  (ZESTRIL ) 20 MG tablet Take 1 tablet (20 mg total) by mouth daily. 06/16/23   Fleeta Finger Selinda, MD  magnesium  oxide (MAG-OX) 400 MG tablet Take 400 mg by mouth daily.    [provider]  metoprolol  succinate (TOPROL -XL) 100 MG 24 hr tablet TAKE 1 TABLET EVERY EVENING AND HOLD FOR LOW BLOOD PRESSURE 03/14/23   Fleeta Finger Selinda, MD                                                                                                                                     Past Surgical History Past Surgical History:  Procedure Laterality Date   CATARACT EXTRACTION Right    [place] - 1980s    CATARACT EXTRACTION Left    [place] - 1980s   CHOLECYSTECTOMY     INGUINAL HERNIA REPAIR Right 01/28/2021   Procedure: LAPAROSCOPIC FEMORAL  HERNIA REPAIR WITH MESH,TRANSVERSE ABDOMINUS PLANE (TAP)BLOCK;  Surgeon: Sheldon Standing, MD;  Location: [facility];  Service: General;  Laterality: Right;   INGUINAL HERNIA REPAIR Bilateral 01/28/2021   Procedure: LAPAROSCOPIC BILATERAL INGUINAL HERNIA REPAIR, LAPAROSCOPIC RETROPSOAS HERNIA REPAIR WITH MESH;  Surgeon: Sheldon Standing, MD;  Location: [facility];  Service: General;  Laterality: Bilateral;   RETINAL DETACHMENT SURGERY     Family History Family History  Problem Relation Age of Onset   Ovarian cancer Mother    Pulmonary embolism Father    Hypertension Son     Social History Social History   Tobacco Use   Smoking status: Former    Current packs/day: 0.00    Types: Cigarettes    Quit date: 1992    Years since quitting: 33.6   Smokeless tobacco: Never  Vaping Use   Vaping status: Never Used  Substance Use Topics   Alcohol use: Yes    Comment: Socially   Drug use: Never   Allergies Bactrim [sulfamethoxazole-trimethoprim] and Sulfa antibiotics  Review of Systems Review of Systems  Genitourinary:  Positive for flank pain.  Musculoskeletal:  Positive for back pain.    Physical Exam Vital Signs  I have reviewed the triage vital signs BP (!) 186/72   Pulse (!) 42   Temp (!) 97.5 F (36.4 C) (Oral)   Resp 18   SpO2 96%   Physical Exam Vitals and nursing note reviewed.  Constitutional:      General: She is not in acute distress.    Appearance: She is well-developed.  HENT:     Head: Normocephalic and atraumatic.  Eyes:     Conjunctiva/sclera: Conjunctivae normal.  Cardiovascular:     Rate and  Rhythm: Normal rate and regular rhythm.     Heart sounds: No murmur heard. Pulmonary:     Effort: Pulmonary effort is normal. No respiratory distress.     Breath sounds: Normal breath sounds.  Abdominal:     Palpations: Abdomen is soft.  Tenderness: There is no abdominal tenderness.  Musculoskeletal:        General: Tenderness present. No swelling.     Cervical back: Neck supple.  Skin:    General: Skin is warm and dry.     Capillary Refill: Capillary refill takes less than 2 seconds.  Neurological:     Mental Status: She is alert.  Psychiatric:        Mood and Affect: Mood normal.     ED Results and Treatments Labs (all labs ordered are listed, but only abnormal results are displayed) Labs Reviewed  URINALYSIS, ROUTINE W REFLEX MICROSCOPIC - Abnormal; Notable for the following components:      Result Value   Color, Urine STRAW (*)    Ketones, ur 5 (*)    Protein, ur 30 (*)    All other components within normal limits  CBC WITH DIFFERENTIAL/PLATELET - Abnormal; Notable for the following components:   RBC 5.62 (*)    Hemoglobin 16.0 (*)    HCT 50.0 (*)    Monocytes Absolute 1.1 (*)    All other components within normal limits  COMPREHENSIVE METABOLIC PANEL WITH GFR - Abnormal; Notable for the following components:   Sodium 132 (*)    Chloride 92 (*)    Glucose, Bld 108 (*)    All other components within normal limits  CBC WITH DIFFERENTIAL/PLATELET                                                                                                                          Radiology CT Renal Stone Study Result Date: 11/20/2023 EXAM: CT UROGRAM 11/20/2023 03:27:54 AM TECHNIQUE: CT of the abdomen and pelvis was performed before and after the administration of intravenous contrast as per CT urogram protocol. Multiplanar reformatted images as well as MIP urogram images are provided for review. Automated exposure control, iterative reconstruction, and/or weight based adjustment  of the mA/kV was utilized to reduce the radiation dose to as low as reasonably achievable. COMPARISON: 01/28/2021 CLINICAL HISTORY: Abdominal/flank pain, stone suspected. FINDINGS: LOWER CHEST: No acute abnormality. LIVER: Cysts in the liver that does not require follow up. GALLBLADDER AND BILE DUCTS: Cholecystectomy. SPLEEN: No acute abnormality. PANCREAS: No acute abnormality. ADRENAL GLANDS: No acute abnormality. KIDNEYS, URETERS AND BLADDER: No stones in the kidneys or ureters. No hydronephrosis. No perinephric or periureteral stranding. Urinary bladder is unremarkable. GI AND BOWEL: Normal appendix. Stomach demonstrates no acute abnormality. There is no bowel obstruction. PERITONEUM AND RETROPERITONEUM: No ascites. No free air. VASCULATURE: Aortic atherosclerotic calcification. LYMPH NODES: No lymphadenopathy. REPRODUCTIVE ORGANS: No acute abnormality. BONES AND SOFT TISSUES: Advanced arthritis right hip. No acute fracture. No focal soft tissue abnormality. IMPRESSION: 1. No acute abnormality in the abdomen or pelvis. Electronically signed by: Norman Gatlin MD 11/20/2023 03:41 AM EDT RP Workstation: [id number]    Pertinent labs & imaging results that were available during my care of the patient were reviewed by me and considered in my medical decision making (  see MDM for details).  Medications Ordered in ED Medications  lisinopril  (ZESTRIL ) tablet 20 mg (20 mg Oral Given 11/20/23 0513)  morphine  (PF) 2 MG/ML injection 2 mg (2 mg Intravenous Given 11/20/23 0328)  ondansetron  (ZOFRAN ) injection 4 mg (4 mg Intravenous Given 11/20/23 0328)  hydrALAZINE  (APRESOLINE ) tablet 50 mg (50 mg Oral Given 11/20/23 0513)                                                                                                                                     Procedures Procedures  (including critical care time)  Medical Decision Making / ED Course   This patient presents to the ED for concern of back pain, flank pain,  this involves an extensive number of treatment options, and is a complaint that carries with it a high risk of complications and morbidity.  The differential diagnosis includes nephrolithiasis, pyelonephritis, obstruction, AAA, musculoskeletal strain, vertebral fracture, intra-abdominal abscess, diverticulitis  MDM: Patient seen emergency room for evaluation of flank pain.  Physical exam with tenderness in the L-spine and in the paraspinal muscles on the right.  Laboratory evaluation with a hemoglobin of 16.0, sodium 132, chloride 92 but is otherwise unremarkable.  Urinalysis is negative for infection.  CT stone study is reassuringly unremarkable.  Patient pain controlled and on reevaluation she is feeling significant improved.  Suspect musculoskeletal origin of patient's pain today.  Of note, patient hypertensive here in the emergency department.  She has no evidence of endorgan damage and other than her back pain she is asymptomatic.  Current ER literature in accordance with the most recent AHA scientific statement advises against aggressive blood pressure control in the emergency department in the absence of symptoms as this is not shown to have clinical benefit and should be managed in the outpatient setting Luanne et al 2023, Bress et al 2024).  We restarted her home medications and blood pressures are trending appropriately.  At this time she does not meet inpatient criteria for admission and will be discharged with outpatient follow-up.   Additional history obtained:  -External records from outside source obtained and reviewed including: Chart review including previous notes, labs, imaging, consultation notes   Lab Tests: -I ordered, reviewed, and interpreted labs.   The pertinent results include:   Labs Reviewed  URINALYSIS, ROUTINE W REFLEX MICROSCOPIC - Abnormal; Notable for the following components:      Result Value   Color, Urine STRAW (*)    Ketones, ur 5 (*)    Protein, ur 30 (*)     All other components within normal limits  CBC WITH DIFFERENTIAL/PLATELET - Abnormal; Notable for the following components:   RBC 5.62 (*)    Hemoglobin 16.0 (*)    HCT 50.0 (*)    Monocytes Absolute 1.1 (*)    All other components within normal limits  COMPREHENSIVE METABOLIC PANEL WITH GFR - Abnormal; Notable for the following components:  Sodium 132 (*)    Chloride 92 (*)    Glucose, Bld 108 (*)    All other components within normal limits  CBC WITH DIFFERENTIAL/PLATELET      EKG   EKG Interpretation Date/Time:  Sunday November 20 2023 03:44:39 EDT Ventricular Rate:  55 PR Interval:  168 QRS Duration:  105 QT Interval:  487 QTC Calculation: 466 R Axis:   86  Text Interpretation: Sinus rhythm Atrial premature complexes Confirmed by Khyleigh Furney ([id number]) on 11/20/2023 4:05:00 AM         Imaging Studies ordered: I ordered imaging studies including CT stone study I independently visualized and interpreted imaging. I agree with the radiologist interpretation   Medicines ordered and prescription drug management: Meds ordered this encounter  Medications   morphine  (PF) 2 MG/ML injection 2 mg   ondansetron  (ZOFRAN ) injection 4 mg   hydrALAZINE  (APRESOLINE ) tablet 50 mg   lisinopril  (ZESTRIL ) tablet 20 mg   lidocaine  (LIDODERM ) 5 %    Sig: Place 1 patch onto the skin daily. Remove & Discard patch within 12 hours or as directed by MD    Dispense:  30 patch    Refill:  0   naproxen  (NAPROSYN ) 375 MG tablet    Sig: Take 1 tablet (375 mg total) by mouth 2 (two) times daily.    Dispense:  20 tablet    Refill:  0    -I have reviewed the patients home medicines and have made adjustments as needed  Critical interventions none    Cardiac Monitoring: The patient was maintained on a cardiac monitor.  I personally viewed and interpreted the cardiac monitored which showed an underlying rhythm of: NSR with PACs  Social Determinants of Health:  Factors impacting patients  care include: none   Reevaluation: After the interventions noted above, I reevaluated the patient and found that they have :improved  Co morbidities that complicate the patient evaluation  Past Medical History:  Diagnosis Date   Basal cell carcinoma 08/29/2019   sclerosis on right temple   Chronic diastolic (congestive) heart failure (HCC)    Closed dislocation of right shoulder 05/07/2019   Closed fracture of superior ramus of pubis, initial encounter (HCC) 05/07/2019   Gout    Hypertension    Hypothyroidism    Retinal detachment    OD [facility]   Shoulder dislocation, right, initial encounter 05/08/2019   Squamous cell carcinoma of skin 08/29/2019   in situ on left buccal cheek   Squamous cell carcinoma of skin 08/29/2019   in situ on left upper arm, posterior   Squamous cell carcinoma of skin 08/29/2019   in situ on left forearm, posterior      Dispostion: I considered admission for this patient, but at this time she does not meet inpatient criteria for admission and will be discharged with outpatient follow-up     Final Clinical Impression(s) / ED Diagnoses Final diagnoses:  Acute midline low back pain without sciatica     @PCDICTATION @    Albertina Dixon, MD 11/20/23 [phone]

## 2023-11-21 DIAGNOSIS — I10 Essential (primary) hypertension: Secondary | ICD-10-CM | POA: Diagnosis not present

## 2023-11-21 DIAGNOSIS — I5032 Chronic diastolic (congestive) heart failure: Secondary | ICD-10-CM | POA: Diagnosis not present

## 2023-11-23 ENCOUNTER — Ambulatory Visit (INDEPENDENT_AMBULATORY_CARE_PROVIDER_SITE_OTHER): Admitting: Physical Medicine and Rehabilitation

## 2023-11-23 ENCOUNTER — Encounter: Payer: Self-pay | Admitting: Physical Medicine and Rehabilitation

## 2023-11-23 DIAGNOSIS — M545 Low back pain, unspecified: Secondary | ICD-10-CM | POA: Diagnosis not present

## 2023-11-23 DIAGNOSIS — M7918 Myalgia, other site: Secondary | ICD-10-CM | POA: Diagnosis not present

## 2023-11-23 DIAGNOSIS — M546 Pain in thoracic spine: Secondary | ICD-10-CM

## 2023-11-23 NOTE — Progress Notes (Signed)
 Pain Scale   Average Pain 0 Patient advising she has Right hip pain and she thinks it is coming from her lower back.        +Driver, -BT, -Dye Allergies.

## 2023-11-23 NOTE — Progress Notes (Signed)
 Rebekah Bush - 89 y.o. female MRN [id number]  Date of birth: [birth year]  Office Visit Note: Visit Date: 11/23/2023 PCP: Fleeta Valeria Mayo, MD Referred by: Fleeta Valeria Mayo, MD  Subjective: Chief Complaint  Patient presents with   Right Hip - Pain   Lower Back - Pain   HPI: Rebekah Bush is a 89 y.o. female who comes in today as a self referral for evaluation of acute right sided lower back pain radiating up right side of back. She currently resides at [facility], her granddaughter is accompanying her during our visit today. Pain started on Saturday. She has chronic right hip pain that is managed by Dr. Jerri. Her pain worsens with activity and bending. She describes pain as sore and aching sensation, currently rates as 1 out of 10. She was recently evaluated in the emergency department on 11/20/2023 for same issue. Recent CT renal stone study shows no acute abnormality in the abdomen or pelvis. She was diagnosed with lumbar strain and prescribed Naproxen  and Lidocaine  patches. She is feeling much better today, very mild pain at this time. Patient denies focal weakness, numbness and tingling. No recent trauma or falls. She is using rolling walker to assist with ambulation.   Of note, she has severe arthritic changes to right hip. She is planning to return to Dr. Jerri for re-evaluation. She is interested in right hip injection at this time.       Review of Systems  Musculoskeletal:  Positive for back pain and myalgias.  Neurological:  Negative for tingling, sensory change, focal weakness and weakness.  All other systems reviewed and are negative.  Otherwise per HPI.  Assessment & Plan: Visit Diagnoses:    ICD-10-CM   1. Acute right-sided low back pain without sciatica  M54.50     2. Acute right-sided thoracic back pain  M54.6     3. Myofascial pain syndrome  M79.18        Plan: Findings:  Acute right sided lower back pain radiating up to right thoracic region. No radicular pain down the  legs. Significant improvement in pain over the last several days, mild soreness to right lumbar paraspinal region. She continues with rest and use of medications. Upon independent review of recent CT renal study there is multi level degenerative changes to lumbar spine, also anterolisthesis of L5 on S1. Patients clinical presentation and exam are consistent with lumbar myofascial strain. We discussed treatment plan in detail today. I would like her to continue with Lidocaine  patches and Naproxen  as needed. I also provided her with paper prescription to start physical therapy at [facility]. I do think she would benefit from strengthening her spine. Should her pain persist would consider obtaining lumbar MRI imaging.  I also helped her arrange appointment to follow up with Dr. Jerri for re-evaluation of right hip pain. She can follow up with us  as needed. No red flag symptoms noted upon exam today.        Meds & Orders: No orders of the defined types were placed in this encounter.  No orders of the defined types were placed in this encounter.   Follow-up: Return if symptoms worsen or fail to improve.   Procedures: No procedures performed      Clinical History: No specialty comments available.   She reports that she quit smoking about 33 years ago. Her smoking use included cigarettes. She has never used smokeless tobacco.  Recent Labs    09/15/23 1445  HGBA1C 5.5  Objective:  VS:  HT:    WT:   BMI:     BP:   HR: bpm  TEMP: ( )  RESP:  Physical Exam Vitals and nursing note reviewed.  HENT:     Head: Normocephalic and atraumatic.     Right Ear: External ear normal.     Left Ear: External ear normal.     Nose: Nose normal.     Mouth/Throat:     Mouth: Mucous membranes are moist.  Eyes:     Extraocular Movements: Extraocular movements intact.  Cardiovascular:     Rate and Rhythm: Normal rate.     Pulses: Normal pulses.  Pulmonary:     Effort: Pulmonary effort is normal.   Abdominal:     General: Abdomen is flat. There is no distension.  Musculoskeletal:        General: Tenderness present.     Cervical back: Tenderness present.     Comments: Patient is slow to rise from seated position to standing. Good lumbar range of motion. No pain noted with facet loading. 5/5 strength noted with bilateral hip flexion, knee flexion/extension, ankle dorsiflexion/plantarflexion and EHL. No clonus noted bilaterally. No pain upon palpation of greater trochanters. No pain with internal/external rotation of bilateral hips. Sensation intact bilaterally. Negative slump test bilaterally. Myofascial tenderness noted upon palpation of right lumbar paraspinal region. Ambulates with rolling walker, gait slow and unsteady.   Skin:    General: Skin is warm and dry.     Capillary Refill: Capillary refill takes less than 2 seconds.  Neurological:     Mental Status: She is alert and oriented to person, place, and time.     Gait: Gait abnormal.  Psychiatric:        Mood and Affect: Mood normal.        Behavior: Behavior normal.     Ortho Exam  Imaging: No results found.  Past Medical/Family/Surgical/Social History: Medications & Allergies reviewed per EMR, new medications updated. Patient Active Problem List   Diagnosis Date Noted   Primary osteoarthritis involving multiple joints 09/15/2023   History of squamous cell carcinoma of skin 09/15/2023   BMI 22.0-22.9, adult 09/15/2023   Weight loss 03/03/2023   Dehydration 06/02/2022   Essential hypertension 06/02/2022   Chronic diastolic CHF (congestive heart failure) (HCC) 06/02/2022   Malnutrition of moderate degree 01/30/2021   Incarcerated femoral hernia with SBO s/p lap repair 01/28/2021 01/28/2021   Gout 01/28/2021   History of fracture due to fall 01/28/2021   SBO (small bowel obstruction) (HCC) 01/28/2021   Arthritis of hand 01/01/2021   Generalized osteoarthritis 07/30/2020   Positive ANA (antinuclear antibody)  07/30/2020   Trigger finger, right ring finger 07/30/2020   Pain in right hip 07/30/2020   Rash and other nonspecific skin eruption 07/30/2020   Advanced nonexudative age-related macular degeneration of right eye with subfoveal involvement 08/15/2019   Intermediate stage nonexudative age-related macular degeneration of left eye 08/01/2019   Right epiretinal membrane 08/01/2019   Acquired hypothyroidism 05/07/2019   QT prolongation 05/07/2019   Past Medical History:  Diagnosis Date   Basal cell carcinoma 08/29/2019   sclerosis on right temple   Chronic diastolic (congestive) heart failure (HCC)    Closed dislocation of right shoulder 05/07/2019   Closed fracture of superior ramus of pubis, initial encounter (HCC) 05/07/2019   Gout    Hypertension    Hypothyroidism    Retinal detachment    OD [facility]   Shoulder dislocation, right,  initial encounter 05/08/2019   Squamous cell carcinoma of skin 08/29/2019   in situ on left buccal cheek   Squamous cell carcinoma of skin 08/29/2019   in situ on left upper arm, posterior   Squamous cell carcinoma of skin 08/29/2019   in situ on left forearm, posterior   Family History  Problem Relation Age of Onset   Ovarian cancer Mother    Pulmonary embolism Father    Hypertension Son    Past Surgical History:  Procedure Laterality Date   CATARACT EXTRACTION Right    [facility] - 1980s    CATARACT EXTRACTION Left    [place] - 1980s   CHOLECYSTECTOMY     INGUINAL HERNIA REPAIR Right 01/28/2021   Procedure: LAPAROSCOPIC FEMORAL  HERNIA REPAIR WITH MESH,TRANSVERSE ABDOMINUS PLANE (TAP)BLOCK;  Surgeon: Sheldon Standing, MD;  Location: [facility];  Service: General;  Laterality: Right;   INGUINAL HERNIA REPAIR Bilateral 01/28/2021   Procedure: LAPAROSCOPIC BILATERAL INGUINAL HERNIA REPAIR, LAPAROSCOPIC RETROPSOAS HERNIA REPAIR WITH MESH;  Surgeon: Sheldon Standing, MD;  Location: [facility];  Service: General;  Laterality: Bilateral;   RETINAL  DETACHMENT SURGERY     Social History   Occupational History   Not on file  Tobacco Use   Smoking status: Former    Current packs/day: 0.00    Types: Cigarettes    Quit date: 1992    Years since quitting: 33.6   Smokeless tobacco: Never  Vaping Use   Vaping status: Never Used  Substance and Sexual Activity   Alcohol use: Yes    Comment: Socially   Drug use: Never   Sexual activity: Not on file

## 2023-11-23 NOTE — Progress Notes (Signed)
 Core Outcome Measures Index (COMI) Back Score  Average Pain 1  COMI Score 10 %

## 2023-11-24 ENCOUNTER — Ambulatory Visit: Admitting: Internal Medicine

## 2023-11-24 VITALS — BP 120/70 | HR 70 | Temp 97.8°F | Resp 18 | Ht 60.0 in | Wt 108.0 lb

## 2023-11-24 DIAGNOSIS — M545 Low back pain, unspecified: Secondary | ICD-10-CM | POA: Insufficient documentation

## 2023-11-24 NOTE — Progress Notes (Signed)
 Office Visit  Subjective   Patient ID: Rebekah Bush   DOB: December 09, 1929   Age: 89 y.o.   MRN: [id number]   Chief Complaint Chief Complaint  Patient presents with   Follow-up    ER follow up     History of Present Illness Rebekah Bush is a 89 y.o. female who returns today for a hospital followup where she was seen at [facility] on 11/20/2023 with low back pain.  This was located in her right lower lumbar area with radiation up her back with spasms.  She complained of dysuria and  nausea but denied chest pain, shortness of breath, headache, fever or other systemic symptoms.  Denies any recent fall or trauma to the back.  They did blood work and a UA which was unremarkable.  They also did a CT abd/pelvis No acute abnormality in the abdomen or pelvis.  They felt this pain was musculoskeletal in origin and she tells me they sent her home on naproxen  and lidocaine  patch.  She took these for a few days and pain resolved and she comes in without pain today.  They also noted that her BP was elevated in the ER but today it is normal.  She saw the NP at ortho yesterday and she agree with this assessment and she wants to the patient to get some outpatient physical therapy for her back.     Past Medical History Past Medical History:  Diagnosis Date   Basal cell carcinoma 08/29/2019   sclerosis on right temple   Chronic diastolic (congestive) heart failure (HCC)    Closed dislocation of right shoulder 05/07/2019   Closed fracture of superior ramus of pubis, initial encounter (HCC) 05/07/2019   Gout    Hypertension    Hypothyroidism    Retinal detachment    OD [facility]   Shoulder dislocation, right, initial encounter 05/08/2019   Squamous cell carcinoma of skin 08/29/2019   in situ on left buccal cheek   Squamous cell carcinoma of skin 08/29/2019   in situ on left upper arm, posterior   Squamous cell carcinoma of skin 08/29/2019   in situ on left forearm, posterior      Allergies Allergies  Allergen Reactions   Bactrim [Sulfamethoxazole-Trimethoprim] Shortness Of Breath, Nausea And Vomiting and Other (See Comments)    Headaches   Sulfa Antibiotics Shortness Of Breath, Nausea And Vomiting and Other (See Comments)    Headaches      Medications  Current Outpatient Medications:    acetaminophen  (TYLENOL ) 500 MG tablet, Take 500 mg by mouth every 6 (six) hours as needed., Disp: , Rfl:    hydrALAZINE  (APRESOLINE ) 50 MG tablet, TAKE 1 TABLET EVERY MORNING AND TAKE 1 TABLET AT BEDTIME, Disp: 180 tablet, Rfl: 3   ibuprofen (ADVIL) 200 MG tablet, Take 200 mg by mouth daily as needed for moderate pain., Disp: , Rfl:    levothyroxine  (SYNTHROID ) 50 MCG tablet, TAKE 1 TABLET EVERY DAY (Patient not taking: Reported on 11/10/2023), Disp: 90 tablet, Rfl: 3   levothyroxine  (SYNTHROID ) 75 MCG tablet, Take 1 tablet (75 mcg total) by mouth daily., Disp: 30 tablet, Rfl: 11   lidocaine  (LIDODERM ) 5 %, Place 1 patch onto the skin daily. Remove & Discard patch within 12 hours or as directed by MD, Disp: 30 patch, Rfl: 0   lisinopril  (ZESTRIL ) 20 MG tablet, Take 1 tablet (20 mg total) by mouth daily., Disp: 90 tablet, Rfl: 3   magnesium  oxide (MAG-OX) 400  MG tablet, Take 400 mg by mouth daily., Disp: , Rfl:    metoprolol  succinate (TOPROL -XL) 100 MG 24 hr tablet, TAKE 1 TABLET EVERY EVENING AND HOLD FOR LOW BLOOD PRESSURE, Disp: 90 tablet, Rfl: 3   naproxen  (NAPROSYN ) 375 MG tablet, Take 1 tablet (375 mg total) by mouth 2 (two) times daily., Disp: 20 tablet, Rfl: 0   Review of Systems Review of Systems  Constitutional:  Negative for chills and fever.  Respiratory:  Negative for shortness of breath.   Cardiovascular:  Negative for chest pain.  Gastrointestinal:  Negative for abdominal pain, constipation, diarrhea, nausea and vomiting.  Genitourinary:  Negative for dysuria, flank pain, frequency, hematuria and urgency.  Neurological:  Negative for dizziness, weakness and  headaches.       Objective:    Vitals BP 120/70   Pulse 70   Temp 97.8 F (36.6 C)   Resp 18   Ht 5' (1.524 m)   Wt 108 lb (49 kg)   SpO2 97%   BMI 21.09 kg/m    Physical Examination Physical Exam Constitutional:      Appearance: Normal appearance. She is not ill-appearing.  Cardiovascular:     Rate and Rhythm: Normal rate and regular rhythm.     Pulses: Normal pulses.     Heart sounds: No murmur heard.    No friction rub. No gallop.  Pulmonary:     Effort: Pulmonary effort is normal. No respiratory distress.     Breath sounds: No wheezing, rhonchi or rales.  Abdominal:     General: Bowel sounds are normal. There is no distension.     Palpations: Abdomen is soft.     Tenderness: There is no abdominal tenderness.  Musculoskeletal:     Right lower leg: No edema.     Left lower leg: No edema.  Skin:    General: Skin is warm and dry.     Findings: No rash.  Neurological:     Mental Status: She is alert.     Comments: Strength is 5/5 in lower extremitites        Assessment & Plan:   Lumbar back pain She states she has had problems with pain in her lower back on/off over the years.  Ortho is going to see her back but in the meantime they are setting her up for physical therapy.      No follow-ups on file.   Selinda Fleeta Finger, MD

## 2023-11-24 NOTE — Assessment & Plan Note (Signed)
 She states she has had problems with pain in her lower back on/off over the years.  Ortho is going to see her back but in the meantime they are setting her up for physical therapy.

## 2023-12-13 ENCOUNTER — Ambulatory Visit (INDEPENDENT_AMBULATORY_CARE_PROVIDER_SITE_OTHER): Admitting: Orthopaedic Surgery

## 2023-12-13 ENCOUNTER — Ambulatory Visit (INDEPENDENT_AMBULATORY_CARE_PROVIDER_SITE_OTHER): Admitting: Sports Medicine

## 2023-12-13 ENCOUNTER — Ambulatory Visit (INDEPENDENT_AMBULATORY_CARE_PROVIDER_SITE_OTHER): Payer: Self-pay

## 2023-12-13 ENCOUNTER — Other Ambulatory Visit: Payer: Self-pay

## 2023-12-13 ENCOUNTER — Encounter: Payer: Self-pay | Admitting: Sports Medicine

## 2023-12-13 DIAGNOSIS — M1611 Unilateral primary osteoarthritis, right hip: Secondary | ICD-10-CM

## 2023-12-13 DIAGNOSIS — M25551 Pain in right hip: Secondary | ICD-10-CM | POA: Diagnosis not present

## 2023-12-13 MED ORDER — LIDOCAINE HCL 1 % IJ SOLN
4.0000 mL | INTRAMUSCULAR | Status: AC | PRN
  Administered 2023-12-13: 4 mL

## 2023-12-13 MED ORDER — METHYLPREDNISOLONE ACETATE 40 MG/ML IJ SUSP
40.0000 mg | INTRAMUSCULAR | Status: AC | PRN
  Administered 2023-12-13: 40 mg via INTRA_ARTICULAR

## 2023-12-13 NOTE — Progress Notes (Signed)
   Procedure Note  Patient: Rebekah Bush             Date of Birth: 12/15/1929           MRN: [id number]             Visit Date: 12/13/2023  Procedures: Visit Diagnoses:  1. Primary osteoarthritis of right hip    Large Joint Inj: R hip joint on 12/13/2023 3:33 PM Indications: pain Details: 22 G 3.5 in needle, ultrasound-guided anterior approach Medications: 4 mL lidocaine  1 %; 40 mg methylPREDNISolone  acetate 40 MG/ML Outcome: tolerated well, no immediate complications  Procedure: US -guided intra-articular hip injection, Right After discussion on risks/benefits/indications and informed verbal consent was obtained, a timeout was performed. Patient was lying supine on exam table. The hip was cleaned with betadine and alcohol swabs. Then utilizing ultrasound guidance, the patient's femoral head and neck junction was identified and subsequently injected with 4:1 lidocaine :depomedrol via an in-plane approach with ultrasound visualization of the injectate administered into the hip joint. Patient tolerated procedure well without immediate complications.  Procedure, treatment alternatives, risks and benefits explained, specific risks discussed. Consent was given by the patient. Immediately prior to procedure a time out was called to verify the correct patient, procedure, equipment, support staff and site/side marked as required. Patient was prepped and draped in the usual sterile fashion.     - patient tolerated procedure well, discussed post-injection protocol - follow-up with Dr. Jerri as indicated; I am happy to see her back as needed  Lonell Sprang, DO Primary Care Sports Medicine Physician  [facility] - Orthopedics  This note was dictated using Dragon naturally speaking software and may contain errors in syntax, spelling, or content which have not been identified prior to signing this note.

## 2023-12-13 NOTE — Progress Notes (Signed)
 Office Visit Note   Patient: Rebekah Bush           Date of Birth: [birth year]           MRN: [id number] Visit Date: 12/13/2023              Requested by: Fleeta Valeria Mayo, MD [address] [place],  [place] [address] PCP: Fleeta Valeria Mayo, MD   Assessment & Plan: Visit Diagnoses:  1. Primary osteoarthritis of right hip     Plan: History of Present Illness Rebekah Bush is a 89 year old female with severe arthritis who presents with right hip pain.  The right hip pain has worsened, especially with leg extension. Pain is localized to the hip and groin area and is aggravated by using an exercise bike, though walking remains tolerable. She has not previously received a cortisone injection but is now considering it as a treatment option. She manages her mobility with a Rolator, which she initially resisted but now finds beneficial for daily activities.  Physical Exam MUSCULOSKELETAL: Right hip pain on rotation and flexion.  Very limited motion.  Results RADIOLOGY Hip X-ray: Severe osteoarthritis  Assessment and Plan Right hip osteoarthritis Severe right hip osteoarthritis confirmed by X-rays. Previous management with acetaminophen  and naproxen  effective for intermittent pain. Physical therapy offers limited benefit due to severity. - Refer to Dr. Burnetta for cortisone injection in the right hip with informed consent for temporary pain relief. - Advise against physical therapy due to limited benefit. - Discuss hip replacement surgery as a future option if pain becomes unmanageable, emphasizing quality of life improvement.  Follow-Up Instructions: No follow-ups on file.   Orders:  Orders Placed This Encounter  Procedures   XR HIP UNILAT W OR W/O PELVIS 2-3 VIEWS RIGHT   No orders of the defined types were placed in this encounter.     Subjective: Chief Complaint  Patient presents with   Right Hip - Pain    HPI  Review of Systems  Constitutional: Negative.   HENT:  Negative.    Eyes: Negative.   Respiratory: Negative.    Cardiovascular: Negative.   Endocrine: Negative.   Musculoskeletal: Negative.   Neurological: Negative.   Hematological: Negative.   Psychiatric/Behavioral: Negative.    All other systems reviewed and are negative.    Objective: Vital Signs: There were no vitals taken for this visit.  Physical Exam Vitals and nursing note reviewed.  Constitutional:      Appearance: She is well-developed.  HENT:     Head: Atraumatic.     Nose: Nose normal.  Eyes:     Extraocular Movements: Extraocular movements intact.  Cardiovascular:     Pulses: Normal pulses.  Pulmonary:     Effort: Pulmonary effort is normal.  Abdominal:     Palpations: Abdomen is soft.  Musculoskeletal:     Cervical back: Neck supple.  Skin:    General: Skin is warm.     Capillary Refill: Capillary refill takes less than 2 seconds.  Neurological:     Mental Status: She is alert. Mental status is at baseline.  Psychiatric:        Behavior: Behavior normal.        Thought Content: Thought content normal.        Judgment: Judgment normal.     Ortho Exam  Specialty Comments:  No specialty comments available.  Imaging: XR HIP UNILAT W OR W/O PELVIS 2-3 VIEWS RIGHT Result Date: 12/13/2023  Advanced degenerative joint disease with bone-on-bone joint space narrowing of the right hip joint.      PMFS History: Patient Active Problem List   Diagnosis Date Noted   Lumbar back pain 11/24/2023   Primary osteoarthritis involving multiple joints 09/15/2023   History of squamous cell carcinoma of skin 09/15/2023   BMI 22.0-22.9, adult 09/15/2023   Weight loss 03/03/2023   Dehydration 06/02/2022   Essential hypertension 06/02/2022   Chronic diastolic CHF (congestive heart failure) (HCC) 06/02/2022   Malnutrition of moderate degree 01/30/2021   Incarcerated femoral hernia with SBO s/p lap repair 01/28/2021 01/28/2021   Gout 01/28/2021   History of fracture  due to fall 01/28/2021   SBO (small bowel obstruction) (HCC) 01/28/2021   Arthritis of hand 01/01/2021   Generalized osteoarthritis 07/30/2020   Positive ANA (antinuclear antibody) 07/30/2020   Trigger finger, right ring finger 07/30/2020   Pain in right hip 07/30/2020   Rash and other nonspecific skin eruption 07/30/2020   Advanced nonexudative age-related macular degeneration of right eye with subfoveal involvement 08/15/2019   Intermediate stage nonexudative age-related macular degeneration of left eye 08/01/2019   Right epiretinal membrane 08/01/2019   Acquired hypothyroidism 05/07/2019   QT prolongation 05/07/2019   Past Medical History:  Diagnosis Date   Basal cell carcinoma 08/29/2019   sclerosis on right temple   Chronic diastolic (congestive) heart failure (HCC)    Closed dislocation of right shoulder 05/07/2019   Closed fracture of superior ramus of pubis, initial encounter (HCC) 05/07/2019   Gout    Hypertension    Hypothyroidism    Retinal detachment    OD [facility]   Shoulder dislocation, right, initial encounter 05/08/2019   Squamous cell carcinoma of skin 08/29/2019   in situ on left buccal cheek   Squamous cell carcinoma of skin 08/29/2019   in situ on left upper arm, posterior   Squamous cell carcinoma of skin 08/29/2019   in situ on left forearm, posterior    Family History  Problem Relation Age of Onset   Ovarian cancer Mother    Pulmonary embolism Father    Hypertension Son     Past Surgical History:  Procedure Laterality Date   CATARACT EXTRACTION Right    [place] - 1980s    CATARACT EXTRACTION Left    [place] - 1980s   CHOLECYSTECTOMY     INGUINAL HERNIA REPAIR Right 01/28/2021   Procedure: LAPAROSCOPIC FEMORAL  HERNIA REPAIR WITH MESH,TRANSVERSE ABDOMINUS PLANE (TAP)BLOCK;  Surgeon: Sheldon Standing, MD;  Location: [facility];  Service: General;  Laterality: Right;   INGUINAL HERNIA REPAIR Bilateral 01/28/2021   Procedure: LAPAROSCOPIC  BILATERAL INGUINAL HERNIA REPAIR, LAPAROSCOPIC RETROPSOAS HERNIA REPAIR WITH MESH;  Surgeon: Sheldon Standing, MD;  Location: [facility];  Service: General;  Laterality: Bilateral;   RETINAL DETACHMENT SURGERY     Social History   Occupational History   Not on file  Tobacco Use   Smoking status: Former    Current packs/day: 0.00    Types: Cigarettes    Quit date: 1992    Years since quitting: 33.6   Smokeless tobacco: Never  Vaping Use   Vaping status: Never Used  Substance and Sexual Activity   Alcohol use: Yes    Comment: Socially   Drug use: Never   Sexual activity: Not on file

## 2023-12-15 ENCOUNTER — Ambulatory Visit: Admitting: Internal Medicine

## 2023-12-21 DIAGNOSIS — I5032 Chronic diastolic (congestive) heart failure: Secondary | ICD-10-CM | POA: Diagnosis not present

## 2023-12-21 DIAGNOSIS — I10 Essential (primary) hypertension: Secondary | ICD-10-CM | POA: Diagnosis not present

## 2024-01-01 ENCOUNTER — Other Ambulatory Visit: Payer: Self-pay | Admitting: Internal Medicine

## 2024-01-03 ENCOUNTER — Ambulatory Visit: Admitting: Internal Medicine

## 2024-01-03 ENCOUNTER — Encounter: Payer: Self-pay | Admitting: Internal Medicine

## 2024-01-03 VITALS — BP 120/70 | HR 72 | Temp 97.7°F | Resp 18 | Ht 60.0 in | Wt 107.1 lb

## 2024-01-03 DIAGNOSIS — M5441 Lumbago with sciatica, right side: Secondary | ICD-10-CM | POA: Insufficient documentation

## 2024-01-03 MED ORDER — LIDOCAINE 5 % EX PTCH: 1.0000 | MEDICATED_PATCH | CUTANEOUS | 0 refills | Status: AC

## 2024-01-03 MED ORDER — TRAMADOL HCL 50 MG PO TABS: 50.0000 mg | ORAL_TABLET | Freq: Two times a day (BID) | ORAL | 0 refills | Status: AC | PRN

## 2024-01-03 NOTE — Progress Notes (Signed)
   Acute Office Visit  Subjective:     Patient ID: Rebekah Bush, female    DOB: 10-05-1929, 89 y.o.   MRN: [id number]  Chief Complaint  Patient presents with   Back Pain    Lower back, and Med refill request.    HPI Patient is in today for back pain. She woke up at night with severe pain in her right side back and it radiate to right upper back. She has the same pain 1 month ago and she went to ED. She says that pain wokeher up last time as well.   She has says that she wanted to have something with her when she has a pain she can not take it.  She take naproxen  that does help and she also take 5% Lidoderm  patch.  She says that when she is sitting she is okay pain get worse when she stand up and walk.  Lidoderm  patch and naproxen  does help with the pain.    She went to the emergency room last time and they did initial workup and that was negative.  She was also seen by Orthopedic few years ago for right hip pain and she did not wanted to have surgery done.  Review of Systems  Constitutional: Negative.   Musculoskeletal:  Positive for back pain.        Objective:    BP 120/70   Pulse 72   Temp 97.7 F (36.5 C)   Resp 18   Ht 5' (1.524 m)   Wt 107 lb 2 oz (48.6 kg)   SpO2 97%   BMI 20.92 kg/m    Physical Exam Constitutional:      Appearance: Normal appearance.  Abdominal:     General: Bowel sounds are normal.     Palpations: Abdomen is soft.  Musculoskeletal:        General: Tenderness present.     Comments:   Painful movement of right hip.  Chronic problem.  Neurological:     Mental Status: She is alert.     No results found for any visits on 01/03/24.      Assessment & Plan:   Problem List Items Addressed This Visit       Nervous and Auditory   Acute right-sided low back pain with right-sided sciatica - Primary     I will send tramadol  50 mg 1 tablets twice a day as needed in case he for pain is not better with naproxen  so she do not have to go to the  emergency room.      Relevant Medications   traMADol  (ULTRAM ) 50 MG tablet    No orders of the defined types were placed in this encounter.   No follow-ups on file.  Roetta Dare, MD

## 2024-01-03 NOTE — Assessment & Plan Note (Signed)
 I will send tramadol  50 mg 1 tablets twice a day as needed in case he for pain is not better with naproxen  so she do not have to go to the emergency room.

## 2024-01-12 ENCOUNTER — Encounter: Payer: Self-pay | Admitting: Internal Medicine

## 2024-01-12 ENCOUNTER — Ambulatory Visit: Admitting: Internal Medicine

## 2024-01-12 ENCOUNTER — Other Ambulatory Visit: Payer: Self-pay | Admitting: Internal Medicine

## 2024-01-12 VITALS — BP 118/78 | HR 51 | Temp 97.5°F | Resp 18 | Wt 109.0 lb

## 2024-01-12 DIAGNOSIS — I10 Essential (primary) hypertension: Secondary | ICD-10-CM

## 2024-01-12 DIAGNOSIS — E039 Hypothyroidism, unspecified: Secondary | ICD-10-CM

## 2024-01-12 NOTE — Assessment & Plan Note (Signed)
 Her BP is doing well and is controlled.  We will conitnue her current medicaitons.

## 2024-01-12 NOTE — Assessment & Plan Note (Signed)
 She is on levothyroxine  75mcg daily.  We will check her TFT's today.

## 2024-01-12 NOTE — Progress Notes (Signed)
 Office Visit  Subjective   Patient ID: Rebekah Bush   DOB: 07-27-1929   Age: 89 y.o.   MRN: [id number]   Chief Complaint Chief Complaint  Patient presents with   Follow-up    3 month follow up     History of Present Illness Rebekah Bush is a 89 yo female who comes in today for followup of her hypertension.   I saw her a few months ago where she noted some elevated BP's.  She is in our chronic medical management for hypertension where oshe noted to have a SBP >200 at that time  My nurses state her remote BP when she had an acute problem with very high elevated BP.  My nurse talked to her on the phone and the patient was asymptomatic.  She had taken her Toprol  in AM and took her lisinopril  around 1 PM that day and her BP came back down.  This period of elevated BP lasted about one day and my nurse states that since then, her BP's have been doing with us  remotely viewing them.   We did set her up for chronic care management last year for her HTN.  She is currently on:  lisinopril  20mg  daily, Toprol  XL 100mg  daily and hydralzine 50mg  BID.  She is now taking Toprol  XL in AM and waits until early afternoon to take her lisinoprilShe had an episode of presyncope where she was hospitalized in 05/2022.  They stated that given her advanced age and very favorable TTE,that they did not recommend strict blood pressure control.  She will have occasional LE edema where she will take lasix  20mg  as needed.  She denies any intolerance to her BP meds.  She denies any headaches, lightheadedness, dizziness, blurred vision, double vision, chest pain, palpitations, SOB, generalized weakness or edema.   The patient also has a history of hypothyroidism which she states she has been taking medications for at least 20 years.  She states they discovered it on routine blood testing.  This past year, her TSH was high and her free T4 was normal.  Earlier in the year, her Free T4 was too high and I decreased her levothyroxine  from  75mcg daily to 50mcg daily.  She is now taking levothyroxine  to 75mcg po daily.  Today, she denies any heat/cold intolerance, unexplained weight changes, tremors, anxiety, insomnia, dry skin, diarrhea, constipation or other problems.     Past Medical History Past Medical History:  Diagnosis Date   Basal cell carcinoma 08/29/2019   sclerosis on right temple   Chronic diastolic (congestive) heart failure (HCC)    Closed dislocation of right shoulder 05/07/2019   Closed fracture of superior ramus of pubis, initial encounter (HCC) 05/07/2019   Gout    Hypertension    Hypothyroidism    Retinal detachment    OD [facility]   Shoulder dislocation, right, initial encounter 05/08/2019   Squamous cell carcinoma of skin 08/29/2019   in situ on left buccal cheek   Squamous cell carcinoma of skin 08/29/2019   in situ on left upper arm, posterior   Squamous cell carcinoma of skin 08/29/2019   in situ on left forearm, posterior     Allergies Allergies  Allergen Reactions   Bactrim [Sulfamethoxazole-Trimethoprim] Shortness Of Breath, Nausea And Vomiting and Other (See Comments)    Headaches   Sulfa Antibiotics Shortness Of Breath, Nausea And Vomiting and Other (See Comments)    Headaches      Medications  Current  Outpatient Medications:    acetaminophen  (TYLENOL ) 500 MG tablet, Take 500 mg by mouth every 6 (six) hours as needed., Disp: , Rfl:    ibuprofen (ADVIL) 200 MG tablet, Take 200 mg by mouth daily as needed for moderate pain., Disp: , Rfl:    levothyroxine  (SYNTHROID ) 75 MCG tablet, Take 1 tablet (75 mcg total) by mouth daily., Disp: 30 tablet, Rfl: 11   lidocaine  (LIDODERM ) 5 %, Place 1 patch onto the skin daily. Remove & Discard patch within 12 hours or as directed by MD, Disp: 30 patch, Rfl: 0   lisinopril  (ZESTRIL ) 20 MG tablet, Take 1 tablet (20 mg total) by mouth daily., Disp: 90 tablet, Rfl: 3   magnesium  oxide (MAG-OX) 400 MG tablet, Take 400 mg by mouth daily., Disp:  , Rfl:    metoprolol  succinate (TOPROL -XL) 100 MG 24 hr tablet, TAKE 1 TABLET EVERY EVENING AND HOLD FOR LOW BLOOD PRESSURE, Disp: 90 tablet, Rfl: 3   naproxen  (NAPROSYN ) 375 MG tablet, Take 1 tablet (375 mg total) by mouth 2 (two) times daily., Disp: 20 tablet, Rfl: 0   traMADol  (ULTRAM ) 50 MG tablet, Take 1 tablet (50 mg total) by mouth every 12 (twelve) hours as needed., Disp: 30 tablet, Rfl: 0   Review of Systems Review of Systems  Constitutional:  Negative for chills, fever, malaise/fatigue and weight loss.  Eyes:  Negative for blurred vision and double vision.  Respiratory:  Negative for cough and shortness of breath.   Cardiovascular:  Negative for chest pain, palpitations and leg swelling.  Gastrointestinal:  Negative for abdominal pain, constipation, diarrhea, nausea and vomiting.  Musculoskeletal:  Negative for myalgias.  Skin:  Negative for rash.  Neurological:  Negative for dizziness, weakness and headaches.       Objective:    Vitals BP 118/78   Pulse (!) 51   Temp (!) 97.5 F (36.4 C)   Resp 18   Wt 109 lb (49.4 kg)   SpO2 99%   BMI 21.29 kg/m    Physical Examination Physical Exam Constitutional:      Appearance: Normal appearance. She is not ill-appearing.  Cardiovascular:     Rate and Rhythm: Normal rate and regular rhythm.     Pulses: Normal pulses.     Heart sounds: No murmur heard.    No friction rub. No gallop.  Pulmonary:     Effort: Pulmonary effort is normal. No respiratory distress.     Breath sounds: No wheezing, rhonchi or rales.  Abdominal:     General: Bowel sounds are normal. There is no distension.     Palpations: Abdomen is soft.     Tenderness: There is no abdominal tenderness.  Musculoskeletal:     Right lower leg: No edema.     Left lower leg: No edema.  Skin:    General: Skin is warm and dry.     Findings: No rash.  Neurological:     Mental Status: She is alert.        Assessment & Plan:   Essential hypertension Her BP  is doing well and is controlled.  We will conitnue her current medicaitons.  Acquired hypothyroidism She is on levothyroxine  75mcg daily.  We will check her TFT's today.    Return in about 3 months (around 04/12/2024).   Selinda Fleeta Finger, MD

## 2024-01-13 LAB — T4, FREE: Free T4: 1.73 ng/dL (ref 0.82–1.77)

## 2024-01-13 LAB — TSH: TSH: 1.71 u[IU]/mL (ref 0.450–4.500)

## 2024-01-20 DIAGNOSIS — I5032 Chronic diastolic (congestive) heart failure: Secondary | ICD-10-CM | POA: Diagnosis not present

## 2024-01-20 DIAGNOSIS — I10 Essential (primary) hypertension: Secondary | ICD-10-CM | POA: Diagnosis not present

## 2024-01-23 ENCOUNTER — Ambulatory Visit: Payer: Self-pay

## 2024-01-23 NOTE — Progress Notes (Signed)
 Patient called.  Patient aware. Tell her that her thyroid  function is normal (tell her son).

## 2024-01-25 ENCOUNTER — Other Ambulatory Visit: Payer: Self-pay | Admitting: Internal Medicine

## 2024-01-25 MED ORDER — LEVOTHYROXINE SODIUM 75 MCG PO TABS: 75.0000 ug | ORAL_TABLET | Freq: Every day | ORAL | 11 refills | Status: AC

## 2024-02-01 ENCOUNTER — Emergency Department (HOSPITAL_BASED_OUTPATIENT_CLINIC_OR_DEPARTMENT_OTHER)

## 2024-02-01 ENCOUNTER — Observation Stay (HOSPITAL_BASED_OUTPATIENT_CLINIC_OR_DEPARTMENT_OTHER)
Admission: EM | Admit: 2024-02-01 | Discharge: 2024-02-03 | Disposition: A | Discharge status: Home / Self Care | Attending: Family Medicine | Admitting: Family Medicine

## 2024-02-01 ENCOUNTER — Other Ambulatory Visit: Payer: Self-pay

## 2024-02-01 ENCOUNTER — Encounter (HOSPITAL_BASED_OUTPATIENT_CLINIC_OR_DEPARTMENT_OTHER): Payer: Self-pay | Admitting: Emergency Medicine

## 2024-02-01 DIAGNOSIS — Z79899 Other long term (current) drug therapy: Secondary | ICD-10-CM | POA: Insufficient documentation

## 2024-02-01 DIAGNOSIS — K449 Diaphragmatic hernia without obstruction or gangrene: Secondary | ICD-10-CM | POA: Insufficient documentation

## 2024-02-01 DIAGNOSIS — E871 Hypo-osmolality and hyponatremia: Secondary | ICD-10-CM | POA: Diagnosis not present

## 2024-02-01 DIAGNOSIS — E039 Hypothyroidism, unspecified: Secondary | ICD-10-CM | POA: Diagnosis present

## 2024-02-01 DIAGNOSIS — I5032 Chronic diastolic (congestive) heart failure: Secondary | ICD-10-CM | POA: Diagnosis present

## 2024-02-01 DIAGNOSIS — R079 Chest pain, unspecified: Secondary | ICD-10-CM | POA: Diagnosis present

## 2024-02-01 DIAGNOSIS — F10920 Alcohol use, unspecified with intoxication, uncomplicated: Secondary | ICD-10-CM | POA: Diagnosis not present

## 2024-02-01 DIAGNOSIS — I110 Hypertensive heart disease with heart failure: Secondary | ICD-10-CM | POA: Diagnosis not present

## 2024-02-01 DIAGNOSIS — M1611 Unilateral primary osteoarthritis, right hip: Secondary | ICD-10-CM | POA: Diagnosis not present

## 2024-02-01 DIAGNOSIS — I160 Hypertensive urgency: Secondary | ICD-10-CM | POA: Diagnosis present

## 2024-02-01 DIAGNOSIS — I10 Essential (primary) hypertension: Principal | ICD-10-CM

## 2024-02-01 LAB — CBC
HCT: 47.6 % — ABNORMAL HIGH (ref 36.0–46.0)
Hemoglobin: 16.3 g/dL — ABNORMAL HIGH (ref 12.0–15.0)
MCH: 29.9 pg (ref 26.0–34.0)
MCHC: 34.2 g/dL (ref 30.0–36.0)
MCV: 87.3 fL (ref 80.0–100.0)
Platelets: 270 K/uL (ref 150–400)
RBC: 5.45 MIL/uL — ABNORMAL HIGH (ref 3.87–5.11)
RDW: 14.2 % (ref 11.5–15.5)
WBC: 9 K/uL (ref 4.0–10.5)
nRBC: 0 % (ref 0.0–0.2)

## 2024-02-01 LAB — TROPONIN T, HIGH SENSITIVITY
Troponin T High Sensitivity: <15 ng/L (ref 0–19)
Troponin T High Sensitivity: <15 ng/L (ref 0–19)
Troponin T High Sensitivity: <15 ng/L (ref 0–19)

## 2024-02-01 LAB — COMPREHENSIVE METABOLIC PANEL WITH GFR
ALT: 15 U/L (ref 0–44)
AST: 26 U/L (ref 15–41)
Albumin: 4.7 g/dL (ref 3.5–5.0)
Alkaline Phosphatase: 113 U/L (ref 38–126)
Anion gap: 14 (ref 5–15)
BUN: 13 mg/dL (ref 8–23)
CO2: 27 mmol/L (ref 22–32)
Calcium: 10.4 mg/dL — ABNORMAL HIGH (ref 8.9–10.3)
Chloride: 89 mmol/L — ABNORMAL LOW (ref 98–111)
Creatinine, Ser: 0.64 mg/dL (ref 0.44–1.00)
GFR, Estimated: >60 mL/min (ref >60)
Glucose, Bld: 107 mg/dL — ABNORMAL HIGH (ref 70–99)
Potassium: 3.5 mmol/L (ref 3.5–5.1)
Sodium: 130 mmol/L — ABNORMAL LOW (ref 135–145)
Total Bilirubin: 0.9 mg/dL (ref 0.0–1.2)
Total Protein: 7.9 g/dL (ref 6.5–8.1)

## 2024-02-01 LAB — PRO BRAIN NATRIURETIC PEPTIDE: Pro Brain Natriuretic Peptide: 681 pg/mL — ABNORMAL HIGH (ref <300.0)

## 2024-02-01 MED ORDER — HYDRALAZINE HCL 20 MG/ML IJ SOLN
5.0000 mg | Freq: Once | INTRAMUSCULAR | Status: AC
  Administered 2024-02-01: 5 mg via INTRAVENOUS
  Filled 2024-02-01: qty 1

## 2024-02-01 MED ORDER — IOHEXOL 300 MG/ML  SOLN
100.0000 mL | Freq: Once | INTRAMUSCULAR | Status: AC | PRN
  Administered 2024-02-01: 100 mL via INTRAVENOUS

## 2024-02-01 MED ORDER — ONDANSETRON 4 MG PO TBDP
4.0000 mg | ORAL_TABLET | Freq: Once | ORAL | Status: AC
  Administered 2024-02-01: 4 mg via ORAL
  Filled 2024-02-01: qty 1

## 2024-02-01 MED ORDER — MORPHINE SULFATE (PF) 4 MG/ML IV SOLN
4.0000 mg | Freq: Once | INTRAVENOUS | Status: AC
  Administered 2024-02-01: 4 mg via INTRAVENOUS
  Filled 2024-02-01: qty 1

## 2024-02-01 MED ORDER — ACETAMINOPHEN 325 MG PO TABS
650.0000 mg | ORAL_TABLET | Freq: Once | ORAL | Status: AC
  Administered 2024-02-01: 650 mg via ORAL
  Filled 2024-02-01: qty 2

## 2024-02-01 NOTE — ED Triage Notes (Signed)
 Reports at home BP readings of over 200/100. Exertional SHOB. Lower limb swelling 3+ edema.. Reports taking prn dose of lasix  at 1600.

## 2024-02-01 NOTE — Discharge Instructions (Addendum)
 Follow-up with your doctor tomorrow as planned.  If your blood pressure continues to be elevated tomorrow you can increase your hydralazine  from twice daily to 3 times daily.  I have also placed a referral to cardiologist as requested for further evaluation of your hypertension

## 2024-02-01 NOTE — ED Provider Notes (Addendum)
 [place] EMERGENCY DEPARTMENT AT [facility] Provider Note   CSN: [id number] Arrival date & time: 02/01/24  1725     Patient presents with: Hypertension   Rebekah Bush is a 89 y.o. female.    Hypertension     Patient has a history of hypertension gout hypothyroidism, CHF.  Patient presents ED with complaints of elevated blood pressure.  Patient states she has had issues like this in the past.  She does take blood pressure medications and took her doses this morning.  Patient reported shortness of breath and leg swelling however she states these symptoms are more chronic and not associated with the blood pressure that she noted today.  She is not having any chest pain.  No fevers or cough  Prior to Admission medications   Medication Sig Start Date End Date Taking? Authorizing Provider  acetaminophen  (TYLENOL ) 500 MG tablet Take 500 mg by mouth every 6 (six) hours as needed.    [provider]  ibuprofen (ADVIL) 200 MG tablet Take 200 mg by mouth daily as needed for moderate pain.    [provider]  levothyroxine  (SYNTHROID ) 75 MCG tablet Take 1 tablet (75 mcg total) by mouth daily. 01/25/24 01/24/25  Fleeta Valeria Mayo, MD  lidocaine  (LIDODERM ) 5 % Place 1 patch onto the skin daily. Remove & Discard patch within 12 hours or as directed by MD 01/03/24   Caleen Dirks, MD  lisinopril  (ZESTRIL ) 20 MG tablet Take 1 tablet (20 mg total) by mouth daily. 06/16/23   Fleeta Valeria Mayo, MD  magnesium  oxide (MAG-OX) 400 MG tablet Take 400 mg by mouth daily.    [provider]  metoprolol  succinate (TOPROL -XL) 100 MG 24 hr tablet TAKE 1 TABLET EVERY EVENING AND HOLD FOR LOW BLOOD PRESSURE 03/14/23   Fleeta Valeria, Mayo, MD  naproxen  (NAPROSYN ) 375 MG tablet Take 1 tablet (375 mg total) by mouth 2 (two) times daily. 11/20/23   Kommor, Madison, MD  traMADol  (ULTRAM ) 50 MG tablet Take 1 tablet (50 mg total) by mouth every 12 (twelve) hours as needed. 01/03/24 01/02/25  Amin, Saad,  MD    Allergies: Bactrim [sulfamethoxazole-trimethoprim] and Sulfa antibiotics    Review of Systems  Updated Vital Signs BP 137/74   Pulse 65   Temp 98.2 F (36.8 C) (Oral)   Resp 17   SpO2 96%   Physical Exam Vitals and nursing note reviewed.  Constitutional:      General: She is not in acute distress.    Appearance: She is well-developed.  HENT:     Head: Normocephalic and atraumatic.     Right Ear: External ear normal.     Left Ear: External ear normal.  Eyes:     General: No scleral icterus.       Right eye: No discharge.        Left eye: No discharge.     Conjunctiva/sclera: Conjunctivae normal.  Neck:     Trachea: No tracheal deviation.  Cardiovascular:     Rate and Rhythm: Normal rate and regular rhythm.  Pulmonary:     Effort: Pulmonary effort is normal. No respiratory distress.     Breath sounds: Normal breath sounds. No stridor. No wheezing or rales.  Abdominal:     General: Bowel sounds are normal. There is no distension.     Palpations: Abdomen is soft.     Tenderness: There is no abdominal tenderness. There is no guarding or rebound.  Musculoskeletal:  General: No tenderness or deformity.     Cervical back: Neck supple.     Right lower leg: Edema present.     Left lower leg: Edema present.  Skin:    General: Skin is warm and dry.     Findings: No rash.  Neurological:     General: No focal deficit present.     Mental Status: She is alert.     Cranial Nerves: No cranial nerve deficit, dysarthria or facial asymmetry.     Sensory: No sensory deficit.     Motor: No abnormal muscle tone or seizure activity.     Coordination: Coordination normal.  Psychiatric:        Mood and Affect: Mood normal.     (all labs ordered are listed, but only abnormal results are displayed) Labs Reviewed  CBC - Abnormal; Notable for the following components:      Result Value   RBC 5.45 (*)    Hemoglobin 16.3 (*)    HCT 47.6 (*)    All other components within  normal limits  PRO BRAIN NATRIURETIC PEPTIDE - Abnormal; Notable for the following components:   Pro Brain Natriuretic Peptide 681.0 (*)    All other components within normal limits  COMPREHENSIVE METABOLIC PANEL WITH GFR - Abnormal; Notable for the following components:   Sodium 130 (*)    Chloride 89 (*)    Glucose, Bld 107 (*)    Calcium  10.4 (*)    All other components within normal limits  TROPONIN T, HIGH SENSITIVITY  TROPONIN T, HIGH SENSITIVITY  TROPONIN T, HIGH SENSITIVITY    EKG: EKG Interpretation Date/Time:  Wednesday February 01 2024 22:19:56 EDT Ventricular Rate:  65 PR Interval:  180 QRS Duration:  104 QT Interval:  464 QTC Calculation: 483 R Axis:   94  Text Interpretation: Sinus rhythm Right axis deviation Consider left ventricular hypertrophy Nonspecific T abnrm, anterolateral leads No significant change since last tracing Confirmed by Randol Simmonds [phone]) on 02/01/2024 10:29:00 PM  Radiology: CT ABDOMEN PELVIS W CONTRAST Result Date: 02/01/2024 EXAM: CT ABDOMEN AND PELVIS WITH CONTRAST 02/01/2024 08:47:55 PM TECHNIQUE: CT of the abdomen and pelvis was performed with the administration of 100 mL of iohexol  (OMNIPAQUE ) 300 MG/ML solution. Multiplanar reformatted images are provided for review. Automated exposure control, iterative reconstruction, and/or weight-based adjustment of the mA/kV was utilized to reduce the radiation dose to as low as reasonably achievable. COMPARISON: CT renal 11/20/23. CLINICAL HISTORY: RLQ abdominal pain. Reports at home BP readings of over 200/100. Exertional SHOB. Lower limb swelling 3+ edema. Reports taking prn dose of lasix  at 1600. FINDINGS: LOWER CHEST: Temperature changes of the visualized lung bases. LIVER: Flow dense lesions within the liver likely represents simple hepatic cysts. GALLBLADDER AND BILE DUCTS: Status post cholecystectomy. No biliary ductal dilatation. SPLEEN: No acute abnormality. PANCREAS: No acute abnormality. ADRENAL  GLANDS: No acute abnormality. KIDNEYS, URETERS AND BLADDER: No stones in the kidneys or ureters. No hydronephrosis. No perinephric or periureteral stranding. Urinary bladder is unremarkable. Delayed images demonstrate no filling defects within the partially visualized collecting systems. GI AND BOWEL: Trace hiatal hernia. Unremarkable appendix. There is no bowel obstruction. PERITONEUM AND RETROPERITONEUM: No ascites. No free air. VASCULATURE: Atherosclerotic plaque of the aorta. Aorta is normal in caliber. LYMPH NODES: No lymphadenopathy. REPRODUCTIVE ORGANS: Uterus and bilateral adnexal regions are unremarkable. BONES AND SOFT TISSUES: Severe degenerative changes of the right hip with erosion of the acetabula but not complete acetabular protrusion. No focal soft tissue abnormality.  IMPRESSION: 1. No acute findings in the abdomen or pelvis to explain right lower quadrant abdominal pain. 2. Severe degenerative changes of the right hip with erosion of the acetabula but not complete acetabular protrusion. 3. Trace hiatal hernia. Electronically signed by: Morgane Naveau MD 02/01/2024 09:03 PM EDT RP Workstation: [id number]   DG Chest Portable 1 View Result Date: 02/01/2024 CLINICAL DATA:  Hypertension, lower limb edema EXAM: PORTABLE CHEST - 1 VIEW COMPARISON:  November 09, 2022 FINDINGS: No focal airspace consolidation, pleural effusion, or pneumothorax. No cardiomegaly. Aortic atherosclerosis. No acute fracture or destructive lesions. Multilevel thoracic osteophytosis. IMPRESSION: No acute cardiopulmonary abnormality. Electronically Signed   By: Rogelia Myers M.D.   On: 02/01/2024 18:09     Procedures   Medications Ordered in the ED  hydrALAZINE  (APRESOLINE ) injection 5 mg (5 mg Intravenous Given 02/01/24 1817)  hydrALAZINE  (APRESOLINE ) injection 5 mg (5 mg Intravenous Given 02/01/24 2002)  acetaminophen  (TYLENOL ) tablet 650 mg (650 mg Oral Given 02/01/24 2001)  morphine  (PF) 4 MG/ML injection 4 mg (4 mg  Intravenous Given 02/01/24 2049)  iohexol  (OMNIPAQUE ) 300 MG/ML solution 100 mL (100 mLs Intravenous Contrast Given 02/01/24 2038)  ondansetron  (ZOFRAN -ODT) disintegrating tablet 4 mg (4 mg Oral Given 02/01/24 2211)    Clinical Course as of 02/01/24 2259  Wed Feb 01, 2024  1832 Pro Brain natriuretic peptide(!) BNP is elevated [JK]  1832 Troponin T, High Sensitivity Troponin normal [JK]  1833 Comprehensive metabolic panel(!) Sodium decreased similar to previous, calcium  slightly increased [JK]  1833 CBC(!) CBC normal [JK]  1833 DG Chest Portable 1 View Chest x-ray without acute findings [JK]  1857 Blood pressure improving now 179/84 [JK]  1918 Blood pressure slightly improving now 189/96 [JK]  2009 Pt now states she is having pain in the right lower abdomen.  Ttp right inguinal area.  Will ct  [JK]  2118 CT scan does not show any acute findings in the abdomen or pelvis.  Patient does have severe degenerative changes in the hip [JK]  2140 Patient's blood pressure now 110s systolic at the bedside [JK]  7740 Case discussed with Dr Shona regarding admission [JK]    Clinical Course User Index [JK] Randol Simmonds, MD                                 Medical Decision Making Amount and/or Complexity of Data Reviewed Labs: ordered. Decision-making details documented in ED Course. Radiology: ordered. Decision-making details documented in ED Course.  Risk OTC drugs. Prescription drug management. Decision regarding hospitalization.   Patient presented to ED with complaints of hypertension.  Patient has had issues with this in the past.  Patient was notably hypertensive with blood pressures up to 232/116.  Patient was treated with hydralazine  with improvement in her blood pressure.  Labs do not show any anemia or significant electrolyte abnormalities.  Hyponatremia noted but this is chronic.  BNP elevated but no findings of CHF on exam.  While the patient was in the ED she started  complaining of groin pain.  CT scan was performed and it did not show any acute abnormality.  Severe arthritis noted.  I suspect this is the etiology of her symptoms.  Discussed increasing her hydralazine  to 3 times daily from twice daily if her blood pressure remains elevated.  I will also place a cardiology referral as requested.  Patient does have a plan to see her primary care doctor tomorrow.  Initially planned on sending patient home as her symptoms had improved and her blood pressure improved.  Patient however started complaining of nausea and chest pain.  Blood pressure is not elevated at this time.  Repeat EKG does not show any acute ischemic changes but with her age comorbidities and female having chest pain and I will add on troponins and consult the medical service for admission  Final diagnoses:  Uncontrolled hypertension  Arthritis of right hip    ED Discharge Orders          Ordered    Ambulatory referral to Cardiology       Comments: If you have not heard from the Cardiology office within the next 72 hours please call [phone].   02/01/24 2141               Randol Simmonds, MD 02/01/24 2206    Randol Simmonds, MD 02/01/24 2259

## 2024-02-02 ENCOUNTER — Ambulatory Visit: Admitting: Internal Medicine

## 2024-02-02 ENCOUNTER — Encounter (HOSPITAL_COMMUNITY): Payer: Self-pay | Admitting: Internal Medicine

## 2024-02-02 DIAGNOSIS — I5032 Chronic diastolic (congestive) heart failure: Secondary | ICD-10-CM | POA: Diagnosis not present

## 2024-02-02 DIAGNOSIS — I160 Hypertensive urgency: Principal | ICD-10-CM

## 2024-02-02 DIAGNOSIS — R079 Chest pain, unspecified: Secondary | ICD-10-CM

## 2024-02-02 DIAGNOSIS — Z743 Need for continuous supervision: Secondary | ICD-10-CM | POA: Diagnosis not present

## 2024-02-02 DIAGNOSIS — E871 Hypo-osmolality and hyponatremia: Secondary | ICD-10-CM

## 2024-02-02 DIAGNOSIS — E039 Hypothyroidism, unspecified: Secondary | ICD-10-CM

## 2024-02-02 DIAGNOSIS — R609 Edema, unspecified: Secondary | ICD-10-CM | POA: Diagnosis not present

## 2024-02-02 DIAGNOSIS — I10 Essential (primary) hypertension: Secondary | ICD-10-CM | POA: Diagnosis present

## 2024-02-02 DIAGNOSIS — M1611 Unilateral primary osteoarthritis, right hip: Secondary | ICD-10-CM | POA: Diagnosis present

## 2024-02-02 LAB — TROPONIN T, HIGH SENSITIVITY: Troponin T High Sensitivity: <15 ng/L (ref 0–19)

## 2024-02-02 MED ORDER — LISINOPRIL 20 MG PO TABS
20.0000 mg | ORAL_TABLET | Freq: Every day | ORAL | Status: DC
  Administered 2024-02-02: 20 mg via ORAL
  Filled 2024-02-02: qty 1

## 2024-02-02 MED ORDER — LEVOTHYROXINE SODIUM 75 MCG PO TABS
75.0000 ug | ORAL_TABLET | Freq: Every day | ORAL | Status: DC
  Administered 2024-02-03: 75 ug via ORAL
  Filled 2024-02-02: qty 1

## 2024-02-02 MED ORDER — MELATONIN 5 MG PO TABS: 5.0000 mg | ORAL_TABLET | Freq: Every evening | ORAL | Status: DC | PRN

## 2024-02-02 MED ORDER — LIDOCAINE 5 % EX PTCH
1.0000 | MEDICATED_PATCH | CUTANEOUS | Status: DC
  Filled 2024-02-02: qty 1

## 2024-02-02 MED ORDER — HYDRALAZINE HCL 20 MG/ML IJ SOLN
5.0000 mg | Freq: Four times a day (QID) | INTRAMUSCULAR | Status: DC | PRN
  Administered 2024-02-02: 5 mg via INTRAVENOUS
  Filled 2024-02-02: qty 1

## 2024-02-02 MED ORDER — POLYETHYLENE GLYCOL 3350 17 G PO PACK: 17.0000 g | PACK | Freq: Every day | ORAL | Status: DC | PRN

## 2024-02-02 MED ORDER — ACETAMINOPHEN 500 MG PO TABS: 500.0000 mg | ORAL_TABLET | Freq: Four times a day (QID) | ORAL | Status: DC | PRN

## 2024-02-02 MED ORDER — MAGNESIUM OXIDE -MG SUPPLEMENT 400 (240 MG) MG PO TABS
400.0000 mg | ORAL_TABLET | Freq: Every day | ORAL | Status: DC
  Administered 2024-02-02 – 2024-02-03 (×2): 400 mg via ORAL
  Filled 2024-02-02 (×2): qty 1

## 2024-02-02 MED ORDER — ONDANSETRON HCL 4 MG/2ML IJ SOLN
4.0000 mg | Freq: Once | INTRAMUSCULAR | Status: AC
  Administered 2024-02-02: 4 mg via INTRAVENOUS
  Filled 2024-02-02: qty 2

## 2024-02-02 MED ORDER — TRAMADOL HCL 50 MG PO TABS
50.0000 mg | ORAL_TABLET | Freq: Four times a day (QID) | ORAL | Status: AC | PRN
Start: 2024-02-02 — End: ?

## 2024-02-02 MED ORDER — METOPROLOL SUCCINATE ER 100 MG PO TB24: 100.0000 mg | ORAL_TABLET | Freq: Every day | ORAL | Status: DC

## 2024-02-02 MED ORDER — TRAMADOL HCL 50 MG PO TABS
50.0000 mg | ORAL_TABLET | Freq: Once | ORAL | Status: AC
  Administered 2024-02-02: 50 mg via ORAL
  Filled 2024-02-02: qty 1

## 2024-02-02 MED ORDER — PROCHLORPERAZINE EDISYLATE 10 MG/2ML IJ SOLN: 5.0000 mg | Freq: Four times a day (QID) | INTRAMUSCULAR | Status: DC | PRN

## 2024-02-02 MED ORDER — HYDRALAZINE HCL 50 MG PO TABS
50.0000 mg | ORAL_TABLET | Freq: Two times a day (BID) | ORAL | Status: DC
  Administered 2024-02-02 – 2024-02-03 (×2): 50 mg via ORAL
  Filled 2024-02-02 (×2): qty 1

## 2024-02-02 NOTE — Assessment & Plan Note (Signed)
 This is described as tightness, lasting seconds, intermittently for about an hour at rest, not associated with SOB or position change, and resolving spontaneously.  It occurred AFTER her BP had been reduced with hydralazine .  ECG, CXR and troponins unremarkable.  Infarction ruled out, no further ischemic work up needed.

## 2024-02-02 NOTE — Assessment & Plan Note (Signed)
 Mild, asymptomatic, present on previous labs. - Follow up with PCP

## 2024-02-02 NOTE — ED Provider Notes (Signed)
  Physical Exam  BP (!) 177/90   Pulse (!) 57   Temp 98.2 F (36.8 C) (Oral)   Resp (!) 25   SpO2 96%   Physical Exam  Procedures  Procedures  ED Course / MDM   Clinical Course as of 02/02/24 0740  Wed Feb 01, 2024  1832 Pro Brain natriuretic peptide(!) BNP is elevated [JK]  1832 Troponin T, High Sensitivity Troponin normal [JK]  1833 Comprehensive metabolic panel(!) Sodium decreased similar to previous, calcium  slightly increased [JK]  1833 CBC(!) CBC normal [JK]  1833 DG Chest Portable 1 View Chest x-ray without acute findings [JK]  1857 Blood pressure improving now 179/84 [JK]  1918 Blood pressure slightly improving now 189/96 [JK]  2009 Pt now states she is having pain in the right lower abdomen.  Ttp right inguinal area.  Will ct  [JK]  2118 CT scan does not show any acute findings in the abdomen or pelvis.  Patient does have severe degenerative changes in the hip [JK]  2140 Patient's blood pressure now 110s systolic at the bedside [JK]  7740 Case discussed with Dr Shona regarding admission [JK]    Clinical Course User Index [JK] Randol Simmonds, MD   Medical Decision Making Amount and/or Complexity of Data Reviewed Labs: ordered. Decision-making details documented in ED Course. Radiology: ordered. Decision-making details documented in ED Course.  Risk OTC drugs. Prescription drug management. Decision regarding hospitalization.   Patient boarding overnight in our emergency room.  Evaluated the patient this morning.  She states that she still feels a little bit off.  Patient was happy that her blood pressure has come down.  She is not feeling short of breath or having any chest pain.       Mannie Pac T, DO 02/02/24 [id number]

## 2024-02-02 NOTE — H&P (Signed)
 History and Physical    Patient: Rebekah Bush FMW:[id number] DOB: 1929-11-18 DOA: 02/01/2024 DOS: the patient was seen and examined on 02/02/2024 PCP: Fleeta Valeria Mayo, MD  Patient coming from: ALF/ILF  Chief Complaint:  Chief Complaint  Patient presents with   Hypertension       HPI:  89 y.o. F with HTN, hypothyroidism presented with asymptomatic hypertension  Was at home yesterday, checked her BP and it was >200 systolic.  Called the nurse at [facility], who recommended she go to the ER.  In the ER, electrolytes unremarkable, Cr normal.  CXR clear, Pro-BNP minimally elevated, no clinical signs of CHF.  Troponin undetectable, ECG showed no ST changes.  BP improved with one dose IV hydralazine  and so she was being prepared for discharge when she noted RLQ abdominal discomfort.  CT abdomen and pelvis with contrast was unremarkable.  She was again being prepared for discharge when she noted chest pain.  EDP then requested admission.  Overnight, she was held in the standalone ER.  She was given none of her home medications, and only an Ensure and a bowl of oatmeal for lunch.  She arrived here and her chest pain, abdominal pain had resolved.  Her BP had spontaneously resolved to 160s.      Review of Systems  Respiratory:  Negative for shortness of breath.   Cardiovascular:  Negative for palpitations, orthopnea and PND.  Neurological:  Negative for focal weakness.  All other systems reviewed and are negative.    Past Medical History:  Diagnosis Date   Basal cell carcinoma 08/29/2019   sclerosis on right temple   Chronic diastolic (congestive) heart failure (HCC)    Closed dislocation of right shoulder 05/07/2019   Closed fracture of superior ramus of pubis, initial encounter (HCC) 05/07/2019   Gout    Hypertension    Hypothyroidism    Retinal detachment    OD [facility]   Shoulder dislocation, right, initial encounter 05/08/2019   Squamous cell carcinoma of  skin 08/29/2019   in situ on left buccal cheek   Squamous cell carcinoma of skin 08/29/2019   in situ on left upper arm, posterior   Squamous cell carcinoma of skin 08/29/2019   in situ on left forearm, posterior   Past Surgical History:  Procedure Laterality Date   CATARACT EXTRACTION Right    [place] - 1980s    CATARACT EXTRACTION Left    [place] - 1980s   CHOLECYSTECTOMY     INGUINAL HERNIA REPAIR Right 01/28/2021   Procedure: LAPAROSCOPIC FEMORAL  HERNIA REPAIR WITH MESH,TRANSVERSE ABDOMINUS PLANE (TAP)BLOCK;  Surgeon: Sheldon Standing, MD;  Location: [facility] ORS;  Service: General;  Laterality: Right;   INGUINAL HERNIA REPAIR Bilateral 01/28/2021   Procedure: LAPAROSCOPIC BILATERAL INGUINAL HERNIA REPAIR, LAPAROSCOPIC RETROPSOAS HERNIA REPAIR WITH MESH;  Surgeon: Sheldon Standing, MD;  Location: [facility] ORS;  Service: General;  Laterality: Bilateral;   RETINAL DETACHMENT SURGERY     Social History:  reports that she quit smoking about 33 years ago. Her smoking use included cigarettes. She has never used smokeless tobacco. She reports current alcohol use. She reports that she does not use drugs.  Allergies  Allergen Reactions   Bactrim [Sulfamethoxazole-Trimethoprim] Shortness Of Breath, Nausea And Vomiting and Other (See Comments)    Headaches   Sulfa Antibiotics Shortness Of Breath, Nausea And Vomiting and Other (See Comments)    Headaches     Family History  Problem Relation Age of Onset   Ovarian cancer Mother  Pulmonary embolism Father    Hypertension Father    Hypertension Son     Prior to Admission medications   Medication Sig Start Date End Date Taking? Authorizing Provider  acetaminophen  (TYLENOL ) 500 MG tablet Take 500 mg by mouth every 6 (six) hours as needed.    [provider]  levothyroxine  (SYNTHROID ) 75 MCG tablet Take 1 tablet (75 mcg total) by mouth daily. 01/25/24 01/24/25  Fleeta Valeria Mayo, MD  lidocaine  (LIDODERM ) 5 % Place 1 patch onto the skin daily. Remove  & Discard patch within 12 hours or as directed by MD 01/03/24   Caleen Dirks, MD  lisinopril  (ZESTRIL ) 20 MG tablet Take 1 tablet (20 mg total) by mouth daily. 06/16/23   Fleeta Valeria Mayo, MD  magnesium  oxide (MAG-OX) 400 MG tablet Take 400 mg by mouth daily.    [provider]  metoprolol  succinate (TOPROL -XL) 100 MG 24 hr tablet TAKE 1 TABLET EVERY EVENING AND HOLD FOR LOW BLOOD PRESSURE 03/14/23   Fleeta Valeria, Mayo, MD  traMADol  (ULTRAM ) 50 MG tablet Take 1 tablet (50 mg total) by mouth every 12 (twelve) hours as needed. 01/03/24 01/02/25  Caleen Dirks, MD    Physical Exam: Vitals:   02/02/24 1400 02/02/24 1500 02/02/24 1600 02/02/24 1645  BP: (!) 157/74 (!) 178/76  (!) 164/115  Pulse: 65   65  Resp: 18 (!) 21  19  Temp: 98.3 F (36.8 C)     TempSrc: Oral     SpO2: 94%   97%  Weight:   46.9 kg    Thin adult female, lying in bed, no acute distress Sclerae anicteric, lids and lashes normal.  No nasal deformity or epistaxis.  OP moist, no oral lesions, dentition in good repair, lips normal, no neck masses RRR no murmurs, no JVD.  Trace ankle edema. Respiratory rate onrmal, lungs clear without rales or wheezes. Abdomen soft, no guarding, no rebound, no TTP, no masses Attention normal, affect normal, oriented to situation and place and recall appears good. Upper extremity strength 5/5 and symmetric, speech fluent, face symemtric, EOMI.    Data Reviewed: BMP shows mild hyponatremia, Cr normal CBC shows slightly erythyocytosis, no leukocytosis or thrombocytosis CXR personally revewed, shows no airspace disease or opacity CT abdomen report reviewed and unremarkable ECG personally reviewed, shows nonspecific TW changes, no ST changes     Assessment and Plan: * Chest pain This is described as tightness, lasting seconds, intermittently for about an hour at rest, not associated with SOB or position change, and resolving spontaneously.  It occurred AFTER her BP had been reduced with  hydralazine .  ECG, CXR and troponins unremarkable.  Infarction ruled out, no further ischemic work up needed.  Hyponatremia Mild, asymptomatic, present on previous labs. - Follow up with PCP  Hypertensive urgency Essential hypertension Chronic diastolic CHF The patient has long-standing HTN, managed well by PCP with hydralazine , lisinopril  and metoprolol .  She has had issues with overly aggressive BP management (including an admission for pre-syncope 1 year ago).  She has also had blood pressure spikes like this in the past, where her pressure went over 200, but resolved within a day, similar to now.  She has not had associated symptoms to suggest pheo (unlikely at this age), CT abdomen showed no adrenal findings.  No laboratory findings to suggest secondary cause of hypertension and episodic rather than progressive time course strongly militates against this.  Given her age, and fluctuations in BP today after her meds were held in  the ER for 24 hours, I feel it is prudent to observe her under appropriate care overnight.  No LE edema, SOB on exertion, or edema on CXR to suggest CHF flare. - Resume home lisinopril  and hydralazine  this PM (she takes in PM) - Resume metoprolol  tomorrow  Acquired hypothyroidism - Continue home levothyroxine          Advance Care Planning: DNR full scope  Consults: None  Family Communication: Called daughters, no answer, no VM available  Severity of Illness: The appropriate patient status for this patient is OBSERVATION. Observation status is judged to be reasonable and necessary in order to provide the required intensity of service to ensure the patient's safety. The patient's presenting symptoms, physical exam findings, and initial radiographic and laboratory data in the context of their medical condition is felt to place them at decreased risk for further clinical deterioration. Furthermore, it is anticipated that the patient will be medically stable  for discharge from the hospital within 2 midnights of admission.   Author: Lonni SHAUNNA Dalton, MD 02/02/2024 5:48 PM  For on call review [email or web address].

## 2024-02-02 NOTE — Assessment & Plan Note (Signed)
 Continue home levothyroxine

## 2024-02-02 NOTE — Assessment & Plan Note (Addendum)
 Essential hypertension Chronic diastolic CHF The patient has long-standing HTN, managed well by PCP with hydralazine , lisinopril  and metoprolol .  She has had issues with overly aggressive BP management (including an admission for pre-syncope 1 year ago).  She has also had blood pressure spikes like this in the past, where her pressure went over 200, but resolved within a day, similar to now.  She has not had associated symptoms to suggest pheo (unlikely at this age), CT abdomen showed no adrenal findings.  No laboratory findings to suggest secondary cause of hypertension and episodic rather than progressive time course strongly militates against this.  Given her age, and fluctuations in BP today after her meds were held in the ER for 24 hours, I feel it is prudent to observe her under appropriate care overnight.  No LE edema, SOB on exertion, or edema on CXR to suggest CHF flare. - Resume home lisinopril  and hydralazine  this PM (she takes in PM) - Resume metoprolol  tomorrow

## 2024-02-02 NOTE — ED Notes (Signed)
 [facility] called for transport

## 2024-02-02 NOTE — Hospital Course (Signed)
 89 y.o. F with HTN, hypothyroidism presented with asymptomatic hypertension  Was at home yesterday, checked her BP and it was >200 systolic.  Called the nurse at [facility], who recommended she go to the ER.  In the ER, electrolytes unremarkable, Cr normal.  CXR clear, Pro-BNP minimally elevated, no clinical signs of CHF.  Troponin undetectable, ECG showed no ST changes.  BP improved with one dose IV hydralazine  and so she was being prepared for discharge when she noted RLQ abdominal discomfort.  CT abdomen and pelvis with contrast was unremarkable.  She was again being prepared for discharge when she noted chest pain.  EDP then requested admission.  Overnight, she was held in the standalone ER.  She was given none of her home medications, and only an Ensure and a bowl of oatmeal for lunch.  She arrived here and her chest pain, abdominal pain had resolved.  Her BP had spontaneously resolved to 160s.

## 2024-02-03 DIAGNOSIS — I160 Hypertensive urgency: Secondary | ICD-10-CM | POA: Diagnosis not present

## 2024-02-03 DIAGNOSIS — R079 Chest pain, unspecified: Secondary | ICD-10-CM | POA: Diagnosis not present

## 2024-02-03 MED ORDER — HYDRALAZINE HCL 50 MG PO TABS: 50.0000 mg | ORAL_TABLET | Freq: Two times a day (BID) | ORAL | Status: DC

## 2024-02-03 NOTE — Plan of Care (Signed)

## 2024-02-03 NOTE — Care Management Obs Status (Signed)
 MEDICARE OBSERVATION STATUS NOTIFICATION   Patient Details  Name: Rebekah Bush MRN: [id number] Date of Birth: 12-23-1929   Medicare Observation Status Notification Given:  Yes    [name] 02/03/2024, 8:59 AM

## 2024-02-03 NOTE — Discharge Summary (Signed)
 Physician Discharge Summary   Patient: Rebekah Bush MRN: [id number] DOB: [birth year]  Admit date:     02/01/2024  Discharge date: 02/03/24  Discharge Physician: Lonni SHAUNNA Dalton   PCP: Fleeta Valeria Mayo, MD     Recommendations at discharge:  Follow up with PCP Dr. Fleeta Cisco in 1 week for hypertension     Discharge Diagnoses: Principal Problem:   Hypertensive urgency Active Problems:   Acquired hypothyroidism   Chronic diastolic CHF (congestive heart failure) (HCC)   Noncardiac chest tightness   Hyponatremia      Hospital Course: 89 y.o. F with HTN, hypothyroidism presented with asymptomatic hypertension  In the ER, her work up was unremarkable, but she reported nonspecific abdominal discomfort and chest tightness and so the hospitalist service was asked to observe overnight.       * Hypertensive urgency See H&P.    Evidently she is in a home remote BP monitoring program with her PCP.  She has had transient BP spikes in the past, which resolved spontaneously like this one.    Here, her BP responded immediately to hydralazine  in the ER.  She was observed in the ER for 18 hours with no medicines and her BP remained 150-170 with nothing, so I think this spike is clearly over.  There is no sustained increase over weeks to suggest a new secondary cause of hypertension.    Here, her BP has responded well to home medciines.  She has no evidence of end organ damage.  Given previous side effects with overly aggressive titration, I recommend resumption of home regimen and close PCP follow up.   Cardiology referral sent, although I suspect this will be low yield.   Noncardiac Chest pain CXR ECG and troponins normal.  Character of pain (mild tightness, lasting seconds, recurring for an hour, at rest, while in the ER setting only) is not concerning in setting of normal testing.   Chronic diastolic CHF She describes her legs as swollen but they are not swollen on  exam.             The [place]  Controlled Substances Registry was reviewed for this patient prior to discharge.  Consultants: None   Disposition: Home Diet recommendation:  Discharge Diet Orders (From admission, onward)     Start     Ordered   02/03/24 0000  Diet - low sodium heart healthy        02/03/24 0927             DISCHARGE MEDICATION: Allergies as of 02/03/2024       Reactions   Bactrim [sulfamethoxazole-trimethoprim] Shortness Of Breath, Nausea And Vomiting, Other (See Comments)   Headaches   Sulfa Antibiotics Shortness Of Breath, Nausea And Vomiting, Other (See Comments)   Headaches         Medication List     TAKE these medications    acetaminophen  500 MG tablet Commonly known as: TYLENOL  Take 500 mg by mouth every 6 (six) hours as needed for mild pain (pain score 1-3).   furosemide  20 MG tablet Commonly known as: LASIX  Take 20 mg by mouth daily as needed for fluid.   hydrALAZINE  50 MG tablet Commonly known as: APRESOLINE  Take 1 tablet (50 mg total) by mouth 2 (two) times daily.   levothyroxine  75 MCG tablet Commonly known as: Synthroid  Take 1 tablet (75 mcg total) by mouth daily.   lidocaine  5 % Commonly known as: Lidoderm  Place 1 patch onto the skin daily.  Remove & Discard patch within 12 hours or as directed by MD What changed:  when to take this reasons to take this additional instructions   lisinopril  20 MG tablet Commonly known as: ZESTRIL  Take 1 tablet (20 mg total) by mouth daily. What changed: when to take this   magnesium  oxide 400 MG tablet Commonly known as: MAG-OX Take 400 mg by mouth daily.   metoprolol  succinate 100 MG 24 hr tablet Commonly known as: TOPROL -XL TAKE 1 TABLET EVERY EVENING AND HOLD FOR LOW BLOOD PRESSURE What changed: See the new instructions.   traMADol  50 MG tablet Commonly known as: Ultram  Take 1 tablet (50 mg total) by mouth every 12 (twelve) hours as needed. What changed:  reasons to take this        Follow-up Information     Fleeta Finger, Selinda, MD. Schedule an appointment as soon as possible for a visit in 1 week(s).   Specialty: Internal Medicine Contact information: [address] [place] [address] [phone]                 Discharge Instructions     Ambulatory referral to Cardiology   Complete by: As directed    If you have not heard from the Cardiology office within the next 72 hours please call [phone].   Diet - low sodium heart healthy   Complete by: As directed    Discharge instructions   Complete by: As directed    **IMPORTANT DISCHARGE INSTRUCTIONS**   From Dr. Jonel: You were evaluated for asymptomatic hypertension  In your case, these appear to be brief episodes or blood pressure spikes which are common and benign  Given the risks of overly aggressive increases in blood pressure medicine, I recommend you continue your home medications, and call Dr. Fleeta Cisco for a follow up appointment  You should be taking: Hydralazine  50 mg twice daily Metoprolol  XL 100 mg once daily Lisinopril  20 mg nightly  Call Dr. Fleeta Cisco to make sure you can be seen on Thursday  If you have elevated blood pressure between now and then, call his on-call number for instructions  NSAIDs can cause elevated blood pressure NSAIDs are the class of medications we talked about, that are pain medicines (over the counter or prescription) that includes things like naproxen , Aleve , ibuprofen, Motrin, or Advil Avoid these   Increase activity slowly   Complete by: As directed        Discharge Exam: Filed Weights   02/02/24 1600 02/03/24 0511  Weight: 46.9 kg 42.5 kg    General: Pt is alert, awake, not in acute distress Cardiovascular: RRR, nl S1-S2, no murmurs appreciated.   No LE edema.   Respiratory: Normal respiratory rate and rhythm.  CTAB without rales or wheezes. Abdominal: Abdomen soft and non-tender.  No distension or HSM.    Neuro/Psych: Strength symmetric in upper and lower extremities.  Judgment and insight appear normal.   Condition at discharge: good  The results of significant diagnostics from this hospitalization (including imaging, microbiology, ancillary and laboratory) are listed below for reference.   Imaging Studies: CT ABDOMEN PELVIS W CONTRAST Result Date: 02/01/2024 EXAM: CT ABDOMEN AND PELVIS WITH CONTRAST 02/01/2024 08:47:55 PM TECHNIQUE: CT of the abdomen and pelvis was performed with the administration of 100 mL of iohexol  (OMNIPAQUE ) 300 MG/ML solution. Multiplanar reformatted images are provided for review. Automated exposure control, iterative reconstruction, and/or weight-based adjustment of the mA/kV was utilized to reduce the radiation dose to as  low as reasonably achievable. COMPARISON: CT renal 11/20/23. CLINICAL HISTORY: RLQ abdominal pain. Reports at home BP readings of over 200/100. Exertional SHOB. Lower limb swelling 3+ edema. Reports taking prn dose of lasix  at 1600. FINDINGS: LOWER CHEST: Temperature changes of the visualized lung bases. LIVER: Flow dense lesions within the liver likely represents simple hepatic cysts. GALLBLADDER AND BILE DUCTS: Status post cholecystectomy. No biliary ductal dilatation. SPLEEN: No acute abnormality. PANCREAS: No acute abnormality. ADRENAL GLANDS: No acute abnormality. KIDNEYS, URETERS AND BLADDER: No stones in the kidneys or ureters. No hydronephrosis. No perinephric or periureteral stranding. Urinary bladder is unremarkable. Delayed images demonstrate no filling defects within the partially visualized collecting systems. GI AND BOWEL: Trace hiatal hernia. Unremarkable appendix. There is no bowel obstruction. PERITONEUM AND RETROPERITONEUM: No ascites. No free air. VASCULATURE: Atherosclerotic plaque of the aorta. Aorta is normal in caliber. LYMPH NODES: No lymphadenopathy. REPRODUCTIVE ORGANS: Uterus and bilateral adnexal regions are unremarkable. BONES AND  SOFT TISSUES: Severe degenerative changes of the right hip with erosion of the acetabula but not complete acetabular protrusion. No focal soft tissue abnormality. IMPRESSION: 1. No acute findings in the abdomen or pelvis to explain right lower quadrant abdominal pain. 2. Severe degenerative changes of the right hip with erosion of the acetabula but not complete acetabular protrusion. 3. Trace hiatal hernia. Electronically signed by: Morgane Naveau MD 02/01/2024 09:03 PM EDT RP Workstation: [id number]   DG Chest Portable 1 View Result Date: 02/01/2024 CLINICAL DATA:  Hypertension, lower limb edema EXAM: PORTABLE CHEST - 1 VIEW COMPARISON:  November 09, 2022 FINDINGS: No focal airspace consolidation, pleural effusion, or pneumothorax. No cardiomegaly. Aortic atherosclerosis. No acute fracture or destructive lesions. Multilevel thoracic osteophytosis. IMPRESSION: No acute cardiopulmonary abnormality. Electronically Signed   By: Rogelia Myers M.D.   On: 02/01/2024 18:09    Microbiology: Results for orders placed or performed during the hospital encounter of 06/01/22  Resp panel by RT-PCR (RSV, Flu A&B, Covid) Anterior Nasal Swab     Status: None   Collection Time: 06/02/22  2:22 PM   Specimen: Anterior Nasal Swab  Result Value Ref Range Status   SARS Coronavirus 2 by RT PCR NEGATIVE NEGATIVE Final   Influenza A by PCR NEGATIVE NEGATIVE Final   Influenza B by PCR NEGATIVE NEGATIVE Final    Comment: (NOTE) The Xpert Xpress SARS-CoV-2/FLU/RSV plus assay is intended as an aid in the diagnosis of influenza from Nasopharyngeal swab specimens and should not be used as a sole basis for treatment. Nasal washings and aspirates are unacceptable for Xpert Xpress SARS-CoV-2/FLU/RSV testing.  Fact Sheet for Patients: BloggerCourse.com  Fact Sheet for Healthcare Providers: SeriousBroker.it  This test is not yet approved or cleared by the [place]  FDA  and has been authorized for detection and/or diagnosis of SARS-CoV-2 by FDA under an Emergency Use Authorization (EUA). This EUA will remain in effect (meaning this test can be used) for the duration of the COVID-19 declaration under Section 564(b)(1) of the Act, 21 U.S.C. section 360bbb-3(b)(1), unless the authorization is terminated or revoked.     Resp Syncytial Virus by PCR NEGATIVE NEGATIVE Final    Comment: (NOTE) Fact Sheet for Patients: BloggerCourse.com  Fact Sheet for Healthcare Providers: SeriousBroker.it  This test is not yet approved or cleared by the [place]  FDA and has been authorized for detection and/or diagnosis of SARS-CoV-2 by FDA under an Emergency Use Authorization (EUA). This EUA will remain in effect (meaning this test can be used) for the duration of the COVID-19  declaration under Section 564(b)(1) of the Act, 21 U.S.C. section 360bbb-3(b)(1), unless the authorization is terminated or revoked.  Performed at [facility], [address], [place], [place] [address]     Labs: CBC: Recent Labs  Lab 02/01/24 1806  WBC 9.0  HGB 16.3*  HCT 47.6*  MCV 87.3  PLT 270   Basic Metabolic Panel: Recent Labs  Lab 02/01/24 1806  NA 130*  K 3.5  CL 89*  CO2 27  GLUCOSE 107*  BUN 13  CREATININE 0.64  CALCIUM  10.4*   Liver Function Tests: Recent Labs  Lab 02/01/24 1806  AST 26  ALT 15  ALKPHOS 113  BILITOT 0.9  PROT 7.9  ALBUMIN  4.7   CBG: No results for input(s): GLUCAP in the last 168 hours.  Discharge time spent: approximately 45 minutes spent on discharge counseling, evaluation of patient on day of discharge, and coordination of discharge planning with nursing, social work, pharmacy and case management  Signed: Lonni SHAUNNA Dalton, MD [facility] 02/03/2024

## 2024-02-03 NOTE — [facility] Notes (Signed)
 Discharge   Patient expressed verbal understanding of discharge POC. Daughter included in education via telephone.   Additional education included in AVS.  Alert oriented in good spirits.   Uses Rollator and independent.  No PIV or Tele on during discharge.     Discharge Lounge notified for transport.  Daughter on the way.

## 2024-02-03 NOTE — TOC CM/SW Note (Signed)
 Transition of Care [facility]) - Inpatient Brief Assessment   Patient Details  Name: Rebekah Bush MRN: [id number] Date of Birth: October 13, 1929  Transition of Care [facility]) CM/SW Contact:    Waddell Barnie Rama, RN Phone Number: 02/03/2024, 9:59 AM   Clinical Narrative: From [facility]UC[facility],, has PCP and insurance on file, states has no HH services in place at this time , has rollator /cane at home.  States family member (daughter)  will transport them home at [employer] and family is support system, states gets medications from [employer] order.  Pta self ambulatory with walker/cane.   There are no ICM  needs identified  at this time.  Please place consult for ICM needs.     Transition of Care Asessment: Insurance and Status: Insurance coverage has been reviewed Patient has primary care physician: Yes Home environment has been reviewed: from [employer] Prior level of function:: indep with rollator Prior/Current Home Services: No current home services Social Drivers of Health Review: SDOH reviewed no interventions necessary Readmission risk has been reviewed: Yes Transition of care needs: no transition of care needs at this time

## 2024-02-03 NOTE — TOC Transition Note (Signed)
 Transition of Care [facility]) - Discharge Note   Patient Details  Name: Rebekah Bush MRN: [id number] Date of Birth: 01-06-1930  Transition of Care [facility]) CM/SW Contact:  Waddell Barnie Rama, RN Phone Number: 02/03/2024, 10:01 AM   Clinical Narrative:    For dc today, her daughter will transport her home .         Patient Goals and CMS Choice            Discharge Placement                       Discharge Plan and Services Additional resources added to the After Visit Summary for                                       Social Drivers of Health (SDOH) Interventions SDOH Screenings   Food Insecurity: No Food Insecurity (02/02/2024)  Housing: Low Risk  (02/02/2024)  Transportation Needs: No Transportation Needs (02/02/2024)  Utilities: Not At Risk (02/02/2024)  Depression (PHQ2-9): Low Risk  (09/15/2023)  Social Connections: Unknown (02/02/2024)  Tobacco Use: Medium Risk (02/02/2024)     Readmission Risk Interventions     No data to display

## 2024-02-03 NOTE — [facility] Notes (Signed)
 B/P 176/80 HR 57, No PRN Medication order and is also stating some nausea. There is no PRN medication for nausea relief. Sent on call Rebekah Bush, Medication ordered and given to patient

## 2024-02-09 ENCOUNTER — Ambulatory Visit: Admitting: Internal Medicine

## 2024-02-09 ENCOUNTER — Encounter: Payer: Self-pay | Admitting: Internal Medicine

## 2024-02-09 VITALS — BP 124/88 | HR 57 | Temp 97.2°F | Resp 18 | Ht 60.0 in | Wt 106.1 lb

## 2024-02-09 DIAGNOSIS — I10 Essential (primary) hypertension: Secondary | ICD-10-CM

## 2024-02-09 NOTE — Assessment & Plan Note (Signed)
 I am not going to change her medications at this time as her BP here and at home has done well.  When her BP goes up over 200, she is asymptomatic.  I told her if her BP is high like this again, she can take an extra dose of hydralazine .

## 2024-02-09 NOTE — Progress Notes (Signed)
 Office Visit  Subjective   Patient ID: Rebekah Bush   DOB: 1929/11/22   Age: 89 y.o.   MRN: [id number]   Chief Complaint Chief Complaint  Patient presents with   Hospitalization Follow-up     History of Present Illness Mrs. Ninneman is a 89 yo female who comes in today for a hospital followup where she was admitted to [facility] from 02/01/2024 until 02/03/2024 for hypertensive urgency.  She is on chronic medical management with us  for HTN where we do remote BP monitoring where she has had transient spikes in her BP in the past.  She noted that her SBP >200 but was asymptomatic which prompted her to go to the [facility] ER.  Her BP responded to hydralazine  in the ER.  However when being prepared from discharge from the ER, she noted RLQ abdominal discomfort.  A CT abd/pelvis was done and was unremarkable.  Her BP had spontaneously resolved to 160s.  Her BP responded well to home medications.   She has no evidence of end organ damage.  Given previous side effects with overly aggressive titration, they recommended resumption of home regimen and close PCP follow up.   Cardiology referral sent, although they suspected this will be low yield.  Today, the patient states her BP has been running in 120-150's.  She is currently on:  lisinopril  20mg  daily, Toprol  XL 100mg  daily and hydralzine 50mg  BID.  She had an episode of presyncope where she was hospitalized in 05/2022.  They stated that given her advanced age and very favorable TTE,that they did not recommend strict blood pressure control.  She will have occasional LE edema where she will take lasix  20mg  as needed.  She denies any intolerance to her BP meds.  She denies any headaches, lightheadedness, dizziness, blurred vision, double vision, chest pain, palpitations, SOB, generalized weakness or edema.          Past Medical History Past Medical History:  Diagnosis Date   Basal cell carcinoma 08/29/2019   sclerosis on right temple   Chronic diastolic  (congestive) heart failure (HCC)    Closed dislocation of right shoulder 05/07/2019   Closed fracture of superior ramus of pubis, initial encounter (HCC) 05/07/2019   Gout    Hypertension    Hypothyroidism    Retinal detachment    OD [facility]   Shoulder dislocation, right, initial encounter 05/08/2019   Squamous cell carcinoma of skin 08/29/2019   in situ on left buccal cheek   Squamous cell carcinoma of skin 08/29/2019   in situ on left upper arm, posterior   Squamous cell carcinoma of skin 08/29/2019   in situ on left forearm, posterior     Allergies Allergies  Allergen Reactions   Bactrim [Sulfamethoxazole-Trimethoprim] Shortness Of Breath, Nausea And Vomiting and Other (See Comments)    Headaches   Sulfa Antibiotics Shortness Of Breath, Nausea And Vomiting and Other (See Comments)    Headaches      Medications  Current Outpatient Medications:    acetaminophen  (TYLENOL ) 500 MG tablet, Take 500 mg by mouth every 6 (six) hours as needed for mild pain (pain score 1-3)., Disp: , Rfl:    furosemide  (LASIX ) 20 MG tablet, Take 20 mg by mouth daily as needed for fluid., Disp: , Rfl:    hydrALAZINE  (APRESOLINE ) 50 MG tablet, Take 1 tablet (50 mg total) by mouth 2 (two) times daily., Disp: , Rfl:    levothyroxine  (SYNTHROID ) 75 MCG tablet, Take 1 tablet (  75 mcg total) by mouth daily., Disp: 30 tablet, Rfl: 11   lidocaine  (LIDODERM ) 5 %, Place 1 patch onto the skin daily. Remove & Discard patch within 12 hours or as directed by MD (Patient taking differently: Place 1 patch onto the skin daily as needed (for pain).), Disp: 30 patch, Rfl: 0   lisinopril  (ZESTRIL ) 20 MG tablet, Take 1 tablet (20 mg total) by mouth daily. (Patient taking differently: Take 20 mg by mouth every evening.), Disp: 90 tablet, Rfl: 3   magnesium  oxide (MAG-OX) 400 MG tablet, Take 400 mg by mouth daily., Disp: , Rfl:    metoprolol  succinate (TOPROL -XL) 100 MG 24 hr tablet, TAKE 1 TABLET EVERY EVENING AND  HOLD FOR LOW BLOOD PRESSURE (Patient taking differently: Take 100 mg by mouth daily.), Disp: 90 tablet, Rfl: 3   traMADol  (ULTRAM ) 50 MG tablet, Take 1 tablet (50 mg total) by mouth every 12 (twelve) hours as needed. (Patient taking differently: Take 50 mg by mouth every 12 (twelve) hours as needed for moderate pain (pain score 4-6).), Disp: 30 tablet, Rfl: 0   Review of Systems Review of Systems  Constitutional:  Negative for chills and fever.  Eyes:  Negative for blurred vision and double vision.  Respiratory:  Negative for cough and shortness of breath.   Cardiovascular:  Positive for leg swelling. Negative for chest pain and palpitations.  Gastrointestinal:  Negative for abdominal pain, constipation, diarrhea, heartburn, nausea and vomiting.  Musculoskeletal:  Negative for myalgias.  Neurological:  Negative for dizziness, weakness and headaches.       Objective:    Vitals BP 124/88 (BP Location: Left Arm, Patient Position: Sitting, Cuff Size: Normal)   Pulse (!) 57   Temp (!) 97.2 F (36.2 C)   Resp 18   Ht 5' (1.524 m)   Wt 106 lb 2 oz (48.1 kg)   SpO2 97%   BMI 20.73 kg/m    Physical Examination Physical Exam Constitutional:      Appearance: Normal appearance. She is not ill-appearing.  Cardiovascular:     Rate and Rhythm: Normal rate and regular rhythm.     Pulses: Normal pulses.     Heart sounds: No murmur heard.    No friction rub. No gallop.  Pulmonary:     Effort: Pulmonary effort is normal. No respiratory distress.     Breath sounds: No wheezing, rhonchi or rales.  Abdominal:     General: Bowel sounds are normal. There is no distension.     Palpations: Abdomen is soft.     Tenderness: There is no abdominal tenderness.  Musculoskeletal:     Right lower leg: No edema.     Left lower leg: No edema.  Skin:    General: Skin is warm and dry.     Findings: No rash.  Neurological:     Mental Status: She is alert.        Assessment & Plan:   Essential  hypertension I am not going to change her medications at this time as her BP here and at home has done well.  When her BP goes up over 200, she is asymptomatic.  I told her if her BP is high like this again, she can take an extra dose of hydralazine .    No follow-ups on file.   Selinda Fleeta Finger, MD

## 2024-02-20 ENCOUNTER — Encounter: Payer: Self-pay | Admitting: Radiology

## 2024-02-20 ENCOUNTER — Other Ambulatory Visit: Payer: Self-pay | Admitting: Internal Medicine

## 2024-04-04 ENCOUNTER — Other Ambulatory Visit: Payer: Self-pay | Admitting: Internal Medicine

## 2024-04-05 ENCOUNTER — Ambulatory Visit: Admitting: Internal Medicine

## 2024-04-20 NOTE — Progress Notes (Signed)
 " Cardiology Office Note:  .   Date:  04/24/2024  ID:  [name], DOB May 21, 1929, MRN [id number] PCP: Laurine Gladden, MD  [facility] Providers Cardiologist:  None   History of Present Illness: .    Chief Complaint  Patient presents with   Hypertension    MARVELLA Bush is a 89 y.o. female with below history who presents for the evaluation of HTN, HFpEF at the request of Randol Simmonds, MD.   History of Present Illness   Rebekah Bush is a 89 year old female with hypertension and diastolic heart failure with preserved ejection fraction who presents for evaluation of hypertension. She is accompanied by her daughter, [name].  She experiences fluctuating blood pressure, with morning readings typically ranging from 120 to 140 over 70s after taking her medications. In the afternoons, her blood pressure rises to around 180 over 90, particularly between 5 to 6 PM. No symptoms such as feeling unwell or dizzy are noted during these episodes, although she experiences shortness of breath.  Her current medication regimen includes lisinopril  20 mg once daily in the morning, hydralazine  50 mg twice daily, and metoprolol  100 mg in the evening. She also takes magnesium  and uses Lasix  as needed for swelling. Her blood pressure decreases somewhat after taking her evening medications, but it remains elevated before taking them.  She has a history of hip osteoarthritis for which she received a steroid injection once. She also has macular degeneration and is undergoing treatment with injections and light therapy. Additionally, she has a scalp issue where a lesion was removed, and it is not healing well.  She lives independently in a retirement community called [facility] and tries to maintain an active lifestyle by walking the corridors, although she has had to reduce her gym activities due to hip pain. She is cautious about her salt intake, as the meals provided at her residence are high in  salt, and she tries to eat salads and avoid soups. She is a widow with four children, seven grandchildren, and five great-grandchildren. She was a runner, broadcasting/film/video and later a designer, jewellery in labor and delivery. She does not smoke and occasionally drinks a glass of wine.           Problem List HTN HFpEF    ROS: All other ROS reviewed and negative. Pertinent positives noted in the HPI.     Studies Reviewed: SABRA       EKG NSR 65 bpm, no acute changes   TTE 06/02/2022  1. Left ventricular ejection fraction, by estimation, is 60 to 65%. Left  ventricular ejection fraction by 3D volume is 62 %. The left ventricle has  normal function. The left ventricle has no regional wall motion  abnormalities. Left ventricular diastolic   parameters are consistent with Grade I diastolic dysfunction (impaired  relaxation). The average left ventricular global longitudinal strain is  -18.1 %. The global longitudinal strain is normal.   2. Right ventricular systolic function is mildly reduced. The right  ventricular size is normal. There is mildly elevated pulmonary artery  systolic pressure. The estimated right ventricular systolic pressure is  39.7 mmHg.   3. Right atrial size was mildly dilated.   4. The mitral valve is normal in structure. Mild mitral valve  regurgitation. No evidence of mitral stenosis.   5. The aortic valve is tricuspid. Aortic valve regurgitation is not  visualized. Aortic valve sclerosis/calcification is present, without any  evidence of aortic stenosis. Aortic valve  area, by VTI measures 1.85 cm.  Aortic valve mean gradient measures  4.0 mmHg. Aortic valve Vmax measures 1.31 m/s.   6. The inferior vena cava is normal in size with greater than 50%  respiratory variability, suggesting right atrial pressure of 3 mmHg.  Physical Exam:   VS:  BP (!) 176/90 (BP Location: Left Arm, Patient Position: Sitting, Cuff Size: Normal)   Pulse 91   Ht 5' (1.524 m)   Wt 112 lb 9.6 oz (51.1 kg)    SpO2 96%   BMI 21.99 kg/m    Wt Readings from Last 3 Encounters:  04/24/24 112 lb 9.6 oz (51.1 kg)  02/09/24 106 lb 2 oz (48.1 kg)  02/03/24 93 lb 11.1 oz (42.5 kg)    GEN: Well nourished, well developed in no acute distress NECK: No JVD; No carotid bruits CARDIAC: RRR, no murmurs, rubs, gallops RESPIRATORY:  Clear to auscultation without rales, wheezing or rhonchi  ABDOMEN: Soft, non-tender, non-distended EXTREMITIES:  trace edema ASSESSMENT AND PLAN: .   Assessment and Plan    Essential hypertension Blood pressure variable, morning 120-140/70s, afternoon 180/90s. Current regimen includes lisinopril , hydralazine , metoprolol . Target 150/90 due to age. Discussed avoiding hypotension to prevent falls. Salt sensitivity noted. - Increased lisinopril  to 20 mg twice daily. - Continue hydralazine  50 mg twice daily. - Continue metoprolol  100 mg in the evening. - Advised extra hydralazine  if BP >180/90 after regular meds. - Recommended strict salt reduction. - Instructed to monitor for hypotension symptoms.  Chronic diastolic heart failure Shortness of breath present but asymptomatic with activity. Edema likely due to fluid retention. - Continue Lasix  as needed for edema. - Recommended compression stockings and leg elevation. - Advised strict salt reduction.                Follow-up: Return if symptoms worsen or fail to improve.  Signed, Darryle DASEN. Barbaraann, MD, [facility]  [facility]  [address] [place], [place] [address] [phone]  11:15 AM   "

## 2024-04-24 ENCOUNTER — Ambulatory Visit: Attending: Cardiovascular Disease | Admitting: Cardiovascular Disease

## 2024-04-24 ENCOUNTER — Encounter: Payer: Self-pay | Admitting: Cardiovascular Disease

## 2024-04-24 VITALS — BP 176/90 | HR 91 | Ht 60.0 in | Wt 112.6 lb

## 2024-04-24 DIAGNOSIS — R600 Localized edema: Secondary | ICD-10-CM | POA: Diagnosis not present

## 2024-04-24 DIAGNOSIS — I10 Essential (primary) hypertension: Secondary | ICD-10-CM | POA: Insufficient documentation

## 2024-04-24 DIAGNOSIS — I5032 Chronic diastolic (congestive) heart failure: Secondary | ICD-10-CM | POA: Insufficient documentation

## 2024-04-24 MED ORDER — LISINOPRIL 20 MG PO TABS: 20.0000 mg | ORAL_TABLET | Freq: Two times a day (BID) | ORAL | 3 refills | Status: AC

## 2024-04-24 NOTE — Patient Instructions (Addendum)
 Medication Instructions:  Your physician has recommended you make the following change in your medication:  TAKE: lisinopril  20 mg by mouth twice daily  Hydralazine  may take an extra dose once daily as needed if blood pressure is above 180   *If you need a refill on your cardiac medications before your next appointment, please call your pharmacy*  Lab Work: NONE   Testing/Procedures: NONE   Follow-Up: As needed   At [facility], you and your health needs are our priority.  As part of our continuing mission to provide you with exceptional heart care, our providers are all part of one team.  This team includes your primary Cardiologist (physician) and Advanced Practice Providers or APPs (Physician Assistants and Nurse Practitioners) who all work together to provide you with the care you need, when you need it.   Provider:   Dr. Burton

## 2024-04-27 ENCOUNTER — Ambulatory Visit (HOSPITAL_BASED_OUTPATIENT_CLINIC_OR_DEPARTMENT_OTHER): Admitting: Cardiovascular Disease
